# Patient Record
Sex: Female | Born: 1935 | Race: White | Hispanic: No | Marital: Married | Smoking: Former smoker
Health system: Southern US, Community
[De-identification: ages and names within clinical notes are randomized; demographics above are authoritative.]

## PROBLEM LIST (undated history)

## (undated) DIAGNOSIS — I6529 Occlusion and stenosis of unspecified carotid artery: Secondary | ICD-10-CM

## (undated) DIAGNOSIS — J309 Allergic rhinitis, unspecified: Secondary | ICD-10-CM

## (undated) DIAGNOSIS — I714 Abdominal aortic aneurysm, without rupture, unspecified: Secondary | ICD-10-CM

## (undated) DIAGNOSIS — F0390 Unspecified dementia without behavioral disturbance: Secondary | ICD-10-CM

## (undated) DIAGNOSIS — I739 Peripheral vascular disease, unspecified: Secondary | ICD-10-CM

## (undated) DIAGNOSIS — I2510 Atherosclerotic heart disease of native coronary artery without angina pectoris: Secondary | ICD-10-CM

## (undated) DIAGNOSIS — I5032 Chronic diastolic (congestive) heart failure: Secondary | ICD-10-CM

## (undated) DIAGNOSIS — I10 Essential (primary) hypertension: Secondary | ICD-10-CM

## (undated) DIAGNOSIS — E7800 Pure hypercholesterolemia, unspecified: Secondary | ICD-10-CM

## (undated) HISTORY — DX: Abdominal aortic aneurysm, without rupture: I71.4

## (undated) HISTORY — DX: Atherosclerotic heart disease of native coronary artery without angina pectoris: I25.10

## (undated) HISTORY — DX: Peripheral vascular disease, unspecified: I73.9

## (undated) HISTORY — DX: Essential (primary) hypertension: I10

## (undated) HISTORY — DX: Pure hypercholesterolemia, unspecified: E78.00

## (undated) HISTORY — DX: Occlusion and stenosis of unspecified carotid artery: I65.29

## (undated) HISTORY — DX: Abdominal aortic aneurysm, without rupture, unspecified: I71.40

## (undated) HISTORY — DX: Allergic rhinitis, unspecified: J30.9

---

## 1994-01-15 HISTORY — PX: COLONOSCOPY: SHX174

## 2005-12-04 ENCOUNTER — Ambulatory Visit: Payer: Self-pay | Admitting: Family Medicine

## 2005-12-04 DIAGNOSIS — J309 Allergic rhinitis, unspecified: Secondary | ICD-10-CM | POA: Insufficient documentation

## 2005-12-10 ENCOUNTER — Encounter: Payer: Self-pay | Admitting: Family Medicine

## 2005-12-12 ENCOUNTER — Ambulatory Visit: Payer: Self-pay | Admitting: Cardiovascular Disease

## 2005-12-12 ENCOUNTER — Encounter: Payer: Self-pay | Admitting: Cardiovascular Disease

## 2005-12-12 ENCOUNTER — Ambulatory Visit: Admission: RE | Admit: 2005-12-12 | Discharge: 2005-12-12 | Payer: Self-pay | Admitting: Family Medicine

## 2005-12-17 LAB — CONVERTED CEMR LAB
Albumin: 4.1 g/dL (ref 3.5–5.2)
CO2: 25 meq/L (ref 19–32)
Chloride: 101 meq/L (ref 96–112)
Cholesterol: 258 mg/dL — ABNORMAL HIGH (ref 0–200)
Glucose, Bld: 104 mg/dL — ABNORMAL HIGH (ref 70–99)
LDL Cholesterol: 182 mg/dL — ABNORMAL HIGH (ref 0–99)
Potassium: 3.8 meq/L (ref 3.5–5.3)
Sodium: 139 meq/L (ref 135–145)
Total Protein: 6.9 g/dL (ref 6.0–8.3)
Triglycerides: 107 mg/dL (ref <150)

## 2006-01-01 ENCOUNTER — Ambulatory Visit: Payer: Self-pay | Admitting: Family Medicine

## 2006-01-23 ENCOUNTER — Ambulatory Visit: Payer: Self-pay | Admitting: Cardiology

## 2006-01-23 ENCOUNTER — Encounter: Payer: Self-pay | Admitting: Family Medicine

## 2006-01-25 ENCOUNTER — Ambulatory Visit: Payer: Self-pay

## 2006-01-29 ENCOUNTER — Ambulatory Visit: Payer: Self-pay | Admitting: Cardiology

## 2006-01-29 ENCOUNTER — Ambulatory Visit (HOSPITAL_COMMUNITY): Admission: RE | Admit: 2006-01-29 | Discharge: 2006-01-29 | Payer: Self-pay | Admitting: Cardiology

## 2006-01-29 ENCOUNTER — Encounter: Payer: Self-pay | Admitting: Cardiology

## 2006-02-01 ENCOUNTER — Encounter: Payer: Self-pay | Admitting: Family Medicine

## 2006-02-04 ENCOUNTER — Ambulatory Visit: Payer: Self-pay | Admitting: Family Medicine

## 2006-02-20 ENCOUNTER — Encounter: Payer: Self-pay | Admitting: Family Medicine

## 2006-02-20 ENCOUNTER — Ambulatory Visit: Payer: Self-pay | Admitting: Cardiology

## 2006-03-01 ENCOUNTER — Encounter: Payer: Self-pay | Admitting: Family Medicine

## 2006-03-01 ENCOUNTER — Telehealth: Payer: Self-pay | Admitting: Family Medicine

## 2006-03-04 ENCOUNTER — Telehealth (INDEPENDENT_AMBULATORY_CARE_PROVIDER_SITE_OTHER): Payer: Self-pay | Admitting: *Deleted

## 2006-03-04 LAB — CONVERTED CEMR LAB
Albumin: 4.2 g/dL (ref 3.5–5.2)
Alkaline Phosphatase: 50 units/L (ref 39–117)
HDL: 59 mg/dL (ref >39)
LDL Cholesterol: 101 mg/dL — ABNORMAL HIGH (ref 0–99)
Total Protein: 6.9 g/dL (ref 6.0–8.3)
Triglycerides: 91 mg/dL (ref <150)
VLDL: 18 mg/dL (ref 0–40)

## 2006-03-06 ENCOUNTER — Ambulatory Visit: Payer: Self-pay | Admitting: Family Medicine

## 2006-05-01 ENCOUNTER — Ambulatory Visit: Payer: Self-pay | Admitting: Cardiology

## 2006-05-07 ENCOUNTER — Encounter: Payer: Self-pay | Admitting: Family Medicine

## 2006-05-07 ENCOUNTER — Ambulatory Visit: Payer: Self-pay | Admitting: Thoracic Surgery (Cardiothoracic Vascular Surgery)

## 2006-05-17 ENCOUNTER — Inpatient Hospital Stay (HOSPITAL_BASED_OUTPATIENT_CLINIC_OR_DEPARTMENT_OTHER): Admission: RE | Admit: 2006-05-17 | Discharge: 2006-05-17 | Payer: Self-pay | Admitting: Cardiovascular Disease

## 2006-05-17 ENCOUNTER — Ambulatory Visit: Payer: Self-pay | Admitting: Cardiovascular Disease

## 2006-05-20 ENCOUNTER — Encounter: Payer: Self-pay | Admitting: Family Medicine

## 2006-05-20 ENCOUNTER — Ambulatory Visit: Payer: Self-pay | Admitting: Thoracic Surgery (Cardiothoracic Vascular Surgery)

## 2006-05-22 ENCOUNTER — Inpatient Hospital Stay (HOSPITAL_COMMUNITY)
Admission: RE | Admit: 2006-05-22 | Discharge: 2006-05-30 | Payer: Self-pay | Admitting: Thoracic Surgery (Cardiothoracic Vascular Surgery)

## 2006-05-22 ENCOUNTER — Ambulatory Visit: Payer: Self-pay | Admitting: Cardiology

## 2006-05-22 ENCOUNTER — Ambulatory Visit: Payer: Self-pay | Admitting: Thoracic Surgery (Cardiothoracic Vascular Surgery)

## 2006-05-22 ENCOUNTER — Encounter (INDEPENDENT_AMBULATORY_CARE_PROVIDER_SITE_OTHER): Payer: Self-pay | Admitting: Specialist

## 2006-05-22 ENCOUNTER — Encounter: Payer: Self-pay | Admitting: Family Medicine

## 2006-05-22 HISTORY — PX: CORONARY ARTERY BYPASS GRAFT: SHX141

## 2006-05-22 HISTORY — PX: MITRAL VALVE REPAIR: SHX2039

## 2006-05-27 ENCOUNTER — Encounter: Payer: Self-pay | Admitting: Family Medicine

## 2006-06-05 ENCOUNTER — Ambulatory Visit: Payer: Self-pay | Admitting: Cardiology

## 2006-06-12 ENCOUNTER — Ambulatory Visit: Payer: Self-pay | Admitting: Cardiology

## 2006-06-20 ENCOUNTER — Encounter: Payer: Self-pay | Admitting: Family Medicine

## 2006-06-24 ENCOUNTER — Encounter: Payer: Self-pay | Admitting: Cardiology

## 2006-06-24 ENCOUNTER — Ambulatory Visit: Payer: Self-pay

## 2006-06-25 ENCOUNTER — Telehealth: Payer: Self-pay | Admitting: Family Medicine

## 2006-06-25 ENCOUNTER — Encounter: Payer: Self-pay | Admitting: Family Medicine

## 2006-06-25 LAB — CONVERTED CEMR LAB: INR: 2.9

## 2006-07-01 ENCOUNTER — Ambulatory Visit: Payer: Self-pay | Admitting: Cardiology

## 2006-07-03 ENCOUNTER — Ambulatory Visit: Payer: Self-pay | Admitting: Family Medicine

## 2006-07-08 ENCOUNTER — Encounter: Payer: Self-pay | Admitting: Family Medicine

## 2006-07-08 ENCOUNTER — Ambulatory Visit: Payer: Self-pay | Admitting: Thoracic Surgery (Cardiothoracic Vascular Surgery)

## 2006-07-17 ENCOUNTER — Ambulatory Visit: Payer: Self-pay | Admitting: Family Medicine

## 2006-07-17 LAB — CONVERTED CEMR LAB
INR: 2.5
Prothrombin Time: 19.1 s

## 2006-07-24 ENCOUNTER — Ambulatory Visit: Payer: Self-pay

## 2006-07-24 ENCOUNTER — Encounter: Payer: Self-pay | Admitting: Cardiology

## 2006-07-26 ENCOUNTER — Telehealth: Payer: Self-pay | Admitting: Family Medicine

## 2006-08-07 ENCOUNTER — Ambulatory Visit: Payer: Self-pay | Admitting: Family Medicine

## 2006-08-07 LAB — CONVERTED CEMR LAB
INR: 2.3
Prothrombin Time: 18.4 s

## 2006-08-09 ENCOUNTER — Encounter: Payer: Self-pay | Admitting: Family Medicine

## 2006-08-09 ENCOUNTER — Ambulatory Visit: Payer: Self-pay | Admitting: Vascular Surgery

## 2006-09-04 ENCOUNTER — Ambulatory Visit: Payer: Self-pay | Admitting: Family Medicine

## 2006-09-04 LAB — CONVERTED CEMR LAB: Prothrombin Time: 26.7 s

## 2006-09-18 ENCOUNTER — Ambulatory Visit: Payer: Self-pay | Admitting: Cardiology

## 2006-09-18 ENCOUNTER — Encounter: Payer: Self-pay | Admitting: Family Medicine

## 2006-10-02 ENCOUNTER — Ambulatory Visit: Payer: Self-pay | Admitting: Family Medicine

## 2006-10-02 LAB — CONVERTED CEMR LAB: Prothrombin Time: 14.4 s

## 2006-10-16 ENCOUNTER — Ambulatory Visit: Payer: Self-pay | Admitting: Family Medicine

## 2006-10-29 ENCOUNTER — Ambulatory Visit: Payer: Self-pay | Admitting: Cardiology

## 2006-10-29 ENCOUNTER — Ambulatory Visit: Payer: Self-pay

## 2006-10-29 ENCOUNTER — Encounter: Payer: Self-pay | Admitting: Cardiology

## 2006-10-29 LAB — CONVERTED CEMR LAB
Albumin: 4.1 g/dL (ref 3.5–5.2)
Bilirubin, Direct: 0.2 mg/dL (ref 0.0–0.3)
GFR calc Af Amer: 70 mL/min
GFR calc non Af Amer: 58 mL/min
HDL: 67.7 mg/dL (ref >39.0)
LDL Cholesterol: 88 mg/dL (ref 0–99)
Potassium: 4 meq/L (ref 3.5–5.1)
Sodium: 141 meq/L (ref 135–145)
Total CHOL/HDL Ratio: 2.5
Triglycerides: 71 mg/dL (ref 0–149)
VLDL: 14 mg/dL (ref 0–40)

## 2006-11-01 ENCOUNTER — Ambulatory Visit: Payer: Self-pay | Admitting: Family Medicine

## 2006-11-01 LAB — CONVERTED CEMR LAB
INR: 1.3
Prothrombin Time: 14.3 s

## 2006-11-04 ENCOUNTER — Telehealth: Payer: Self-pay | Admitting: Family Medicine

## 2006-11-08 ENCOUNTER — Ambulatory Visit: Payer: Self-pay | Admitting: Family Medicine

## 2006-11-08 LAB — CONVERTED CEMR LAB: Prothrombin Time: 15.3 s

## 2007-01-20 ENCOUNTER — Ambulatory Visit: Payer: Self-pay | Admitting: Thoracic Surgery (Cardiothoracic Vascular Surgery)

## 2007-01-22 ENCOUNTER — Ambulatory Visit: Payer: Self-pay | Admitting: Cardiology

## 2007-01-29 ENCOUNTER — Ambulatory Visit: Payer: Self-pay | Admitting: Vascular Surgery

## 2007-07-30 ENCOUNTER — Encounter: Payer: Self-pay | Admitting: Family Medicine

## 2007-07-30 ENCOUNTER — Ambulatory Visit: Payer: Self-pay | Admitting: Vascular Surgery

## 2007-09-03 ENCOUNTER — Ambulatory Visit: Payer: Self-pay | Admitting: Vascular Surgery

## 2007-09-04 ENCOUNTER — Encounter: Payer: Self-pay | Admitting: Vascular Surgery

## 2007-09-04 ENCOUNTER — Inpatient Hospital Stay (HOSPITAL_COMMUNITY): Admission: RE | Admit: 2007-09-04 | Discharge: 2007-09-05 | Payer: Self-pay | Admitting: Vascular Surgery

## 2007-09-04 ENCOUNTER — Ambulatory Visit: Payer: Self-pay | Admitting: Vascular Surgery

## 2007-09-17 ENCOUNTER — Ambulatory Visit: Payer: Self-pay | Admitting: Vascular Surgery

## 2007-12-03 ENCOUNTER — Ambulatory Visit: Payer: Self-pay | Admitting: Family Medicine

## 2008-01-21 ENCOUNTER — Ambulatory Visit: Payer: Self-pay | Admitting: Cardiology

## 2008-03-15 HISTORY — PX: CAROTID ENDARTERECTOMY: SUR193

## 2008-03-17 ENCOUNTER — Ambulatory Visit: Payer: Self-pay | Admitting: Vascular Surgery

## 2008-04-05 ENCOUNTER — Inpatient Hospital Stay (HOSPITAL_COMMUNITY): Admission: RE | Admit: 2008-04-05 | Discharge: 2008-04-06 | Payer: Self-pay | Admitting: Vascular Surgery

## 2008-04-05 ENCOUNTER — Ambulatory Visit: Payer: Self-pay | Admitting: Internal Medicine

## 2008-04-05 ENCOUNTER — Encounter: Payer: Self-pay | Admitting: Vascular Surgery

## 2008-04-05 ENCOUNTER — Ambulatory Visit: Payer: Self-pay | Admitting: Vascular Surgery

## 2008-04-28 ENCOUNTER — Ambulatory Visit: Payer: Self-pay | Admitting: Vascular Surgery

## 2008-10-06 ENCOUNTER — Ambulatory Visit: Payer: Self-pay | Admitting: Vascular Surgery

## 2008-10-07 ENCOUNTER — Telehealth: Payer: Self-pay | Admitting: Cardiology

## 2009-04-27 ENCOUNTER — Encounter: Payer: Self-pay | Admitting: Family Medicine

## 2009-04-27 ENCOUNTER — Ambulatory Visit: Payer: Self-pay | Admitting: Vascular Surgery

## 2009-07-26 DIAGNOSIS — E876 Hypokalemia: Secondary | ICD-10-CM

## 2009-07-27 ENCOUNTER — Ambulatory Visit: Payer: Self-pay | Admitting: Cardiology

## 2009-07-27 ENCOUNTER — Encounter: Payer: Self-pay | Admitting: Cardiology

## 2009-08-22 ENCOUNTER — Ambulatory Visit: Payer: Self-pay | Admitting: Cardiology

## 2009-08-22 ENCOUNTER — Ambulatory Visit: Payer: Self-pay | Admitting: Internal Medicine

## 2009-08-22 ENCOUNTER — Encounter: Payer: Self-pay | Admitting: Cardiology

## 2009-08-22 ENCOUNTER — Ambulatory Visit (HOSPITAL_COMMUNITY): Admission: RE | Admit: 2009-08-22 | Discharge: 2009-08-22 | Payer: Self-pay | Admitting: Cardiology

## 2009-08-22 ENCOUNTER — Ambulatory Visit: Payer: Self-pay

## 2009-08-22 ENCOUNTER — Encounter: Payer: Self-pay | Admitting: Cardiovascular Disease

## 2009-08-23 LAB — CONVERTED CEMR LAB
Albumin: 3.8 g/dL (ref 3.5–5.2)
CO2: 28 meq/L (ref 19–32)
GFR calc non Af Amer: 81.35 mL/min (ref >60)
Glucose, Bld: 90 mg/dL (ref 70–99)
HDL: 63.7 mg/dL (ref >39.00)
Potassium: 4.1 meq/L (ref 3.5–5.1)
Sodium: 144 meq/L (ref 135–145)
TSH: 2.75 microintl units/mL (ref 0.35–5.50)
Total Bilirubin: 1 mg/dL (ref 0.3–1.2)
Total CHOL/HDL Ratio: 2
Triglycerides: 54 mg/dL (ref 0.0–149.0)
VLDL: 10.8 mg/dL (ref 0.0–40.0)

## 2009-09-07 ENCOUNTER — Ambulatory Visit: Payer: Self-pay | Admitting: Cardiology

## 2009-09-07 ENCOUNTER — Encounter: Payer: Self-pay | Admitting: Cardiology

## 2009-09-07 DIAGNOSIS — I701 Atherosclerosis of renal artery: Secondary | ICD-10-CM

## 2009-09-15 ENCOUNTER — Encounter: Payer: Self-pay | Admitting: Cardiology

## 2009-12-29 ENCOUNTER — Ambulatory Visit: Payer: Self-pay | Admitting: Vascular Surgery

## 2010-02-10 NOTE — Procedures (Unsigned)
CAROTID DUPLEX EXAM  INDICATION:  Follow up carotid artery disease.  HISTORY: Diabetes:  No. Cardiac:  CABG. Hypertension:  Yes. Smoking:  No. Previous Surgery:  Bilateral carotid endarterectomies. CV History:  Patient is currently asymptomatic. Amaurosis Fugax No, Paresthesias No, Hemiparesis No.                                      RIGHT             LEFT Brachial systolic pressure:         168               164 Brachial Doppler waveforms:         WNL               WNL Vertebral direction of flow:        Antegrade         Antegrade DUPLEX VELOCITIES (cm/sec) CCA peak systolic                   25                109 ECA peak systolic                   46                114 ICA peak systolic                   82                80 ICA end diastolic                   31                19 PLAQUE MORPHOLOGY:                  Heterogenous      Heterogenous PLAQUE AMOUNT:                      Moderate          Mild PLAQUE LOCATION:                    CCA, ICA, ECA     ECA, CCA  IMPRESSION: 1. Bilateral patent carotid endarterectomies. 2. No hemodynamically significant stenosis. 3. Bilateral intimal thickening within the common carotid arteries. 4. The right external carotid artery appears occluded within the     proximal origin with some trickle flow past the proximal segment.  ___________________________________________ Janetta Hora. Fields, MD  OD/MEDQ  D:  01/30/2010  T:  01/30/2010  Job:  [id number]

## 2010-02-14 NOTE — Assessment & Plan Note (Signed)
Summary: [facility]  Medications Added AMLODIPINE BESYLATE 5 MG TABS (AMLODIPINE BESYLATE) Take one tablet by mouth daily      Allergies Added: NKDA  Visit Type:  Follow-up Primary Provider:  Linford Arnold, C  CC:  High blood pressure.  History of Present Illness: [name] is a pleasant female who has a history of mitral valve repair in May 2008 secondary to severe mitral regurgitation/mitral valve prolapse.  She also had coronary bypassing graft at that time with a LIMA to the LAD and a saphenous vein graft to the ramus intermedius. She also had closure of patent foramen ovale and ligation of the left atrial appendage.  Her most recent echocardiogram was performed on October 29, 2006.  She had normal LV function.  There was mild aortic insufficiency.  There is only trivial mitral regurgitation.  The left atrium was mildly dilated as was the right atrium.  The right ventricle was mildly dilated.  There was a probable eustachian valve or Chiari network in the right atrium. She had carotid endarterectomy in March 2010. I last saw her in January of 2010. Since then, she has dyspnea with more extreme activities but not with routine activities. It is relieved with rest. There is no orthopnea, PND, pedal edema, palpitations or syncope. She occasionally feels chest tightness in the upper chest area when she feels "stressed". This also occurrs with more extreme activities and is relieved with rest. It has been present since her bypass surgery and is unchanged in severity. She is not having symptoms at rest.   Preventive Screening-Counseling & Management  Alcohol-Tobacco     Smoking Status: quit  Current Medications (verified): 1)  Aspirin 81 Mg Tbec (Aspirin) .... Take 1 Tablet By Mouth Once A Day 2)  Enalapril Maleate 20 Mg Tabs (Enalapril Maleate) .Marland Kitchen.. 1 Tab By Mouth Once Daily 3)  Toprol Xl 100 Mg Tb24 (Metoprolol Succinate) .... Take 1 Tablet By Mouth Once A Day 4)  Furosemide 40 Mg  Tabs (Furosemide) .... Take One Tablet By Mouth Daily. 5)  Potassium Chloride Crys Cr 20 Meq Cr-Tabs (Potassium Chloride Crys Cr) .Marland Kitchen.. 1 Tab By Mouth Once Daily 6)  Lipitor 80 Mg Tabs (Atorvastatin Calcium) .... Take One Tablet By Mouth Daily.  Allergies (verified): No Known Drug Allergies  Past History:  Past Medical History: CAROTID ARTERY STENOSIS  HYPERTENSION, BENIGN ESSENTIAL PVD  ATHEROSCLEROSIS, CORONARY, NATIVE ARTERY  HYPERCHOLESTEROLEMIA  RHINITIS, ALLERGIC NOS  Past Surgical History: Reviewed history from 07/26/2009 and no changes required. Mitral Valve repair (quadrangular resection of post leaflet w/ sliding laeflet annuloplaty and 24mm Edwards Physio ring)  05-22-06 CABG x 2 (Left int mammary artery to distal left ant descending CA, saphenous vein graft to ramus intermediate branch)  05-22-06 Right carotid endarterectomy  03/2008 Colonoscopy in [place] in [place].    Social History: Reviewed history from 12/04/2005 and no changes required. 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   Tobacco Use - Former.  Smoking Status:  quit  Review of Systems       no fevers or chills, productive cough, hemoptysis, dysphasia, odynophagia, melena, hematochezia, dysuria, hematuria, rash, seizure activity, orthopnea, PND, pedal edema, claudication. Remaining systems are negative.   Vital Signs:  Patient profile:   77 year old female Height:      61 inches Weight:      150 pounds BMI:     28.44 Pulse rate:   73 / minute Pulse rhythm:   regular Resp:  18 per minute BP sitting:   211 / 93  (left arm) Cuff size:   large  Vitals Entered By: Vikki Ports (July 27, 2009 4:09 PM)  Physical Exam  General:  Well-developed well-nourished in no acute distress.  Skin is warm and dry.  HEENT is normal. No papilledema Neck is supple. No thyromegaly. bilateral bruits. Status post right carotid endarterectomy. Chest is clear to auscultation with normal expansion.    Cardiovascular exam is regular rate and rhythm. 2/6 systolic ejection murmur. Abdominal exam nontender or distended. No masses palpated. Extremities show no edema. neuro grossly intact    EKG  Procedure date:  07/27/2009  Findings:      Normal sinus rhythm at a rate of 68. Axis normal. Nonspecific ST changes.  Impression & Recommendations:  Problem # 1:  CAROTID ARTERY STENOSIS (ICD-433.10) Continue aspirin and statin. The following medications were removed from the medication list:    Coumadin 4 Mg Tabs (Warfarin sodium) ..... Sunday - 4 mg, monday - 4 mg, tuesday - 4 mg, wednesday - 5 mg, thursday - 4 mg, friday - 4 mg, saturday - 4 mg    Coumadin 1 Mg Tabs (Warfarin sodium) ..... Sunday - 4 mg, monday - 4 mg, tuesday - 4 mg, wednesday - 5 mg, thursday - 4 mg, friday - 4 mg, saturday - 4 mg Her updated medication list for this problem includes:    Aspirin 81 Mg Tbec (Aspirin) .Marland Kitchen... Take 1 tablet by mouth once a day  Problem # 2:  HYPERTENSION, BENIGN ESSENTIAL (ICD-401.1)  Blood pressure elevated. Add Norvasc 5 mg p.o. daily. Increase as needed. Given recent increase in blood pressure and history of vascular disease check renal Dopplers. Note patient was given clonidine 0.2 mg p.o. total while in the office and her systolic blood pressure improved to 195 and her diastolic 90. She did not have papilledema on examination, headaches or other signs of elevated blood pressure. Her updated medication list for this problem includes:    Aspirin 81 Mg Tbec (Aspirin) .Marland Kitchen... Take 1 tablet by mouth once a day    Enalapril Maleate 20 Mg Tabs (Enalapril maleate) .Marland Kitchen... 1 tab by mouth once daily    Toprol Xl 100 Mg Tb24 (Metoprolol succinate) .Marland Kitchen... Take 1 tablet by mouth once a day    Furosemide 40 Mg Tabs (Furosemide) .Marland Kitchen... Take one tablet by mouth daily.    Amlodipine Besylate 5 Mg Tabs (Amlodipine besylate) .Marland Kitchen... Take one tablet by mouth daily  Orders: Clonidine 0.1mg  tab [facility]) Renal  Artery Duplex (Renal Artery Duplex) Clonidine 0.1mg  tab (EMRORAL)  Problem # 3:  ATHEROSCLEROSIS, CORONARY, NATIVE ARTERY (ICD-414.01) Continue aspirin, ACE inhibitor, beta blocker and statin. The following medications were removed from the medication list:    Coumadin 4 Mg Tabs (Warfarin sodium) ..... Sunday - 4 mg, monday - 4 mg, tuesday - 4 mg, wednesday - 5 mg, thursday - 4 mg, friday - 4 mg, saturday - 4 mg    Coumadin 1 Mg Tabs (Warfarin sodium) ..... Sunday - 4 mg, monday - 4 mg, tuesday - 4 mg, wednesday - 5 mg, thursday - 4 mg, friday - 4 mg, saturday - 4 mg Her updated medication list for this problem includes:    Aspirin 81 Mg Tbec (Aspirin) .Marland Kitchen... Take 1 tablet by mouth once a day    Enalapril Maleate 20 Mg Tabs (Enalapril maleate) .Marland Kitchen... 1 tab by mouth once daily    Toprol Xl 100 Mg Tb24 (Metoprolol succinate) .Marland KitchenMarland KitchenMarland KitchenMarland Kitchen  Take 1 tablet by mouth once a day    Amlodipine Besylate 5 Mg Tabs (Amlodipine besylate) .Marland Kitchen... Take one tablet by mouth daily  Problem # 4:  HYPERCHOLESTEROLEMIA (ICD-272.0) Continue statin. Check lipids and liver. The following medications were removed from the medication list:    Zocor 80 Mg Tabs (Simvastatin) .Marland Kitchen... Take 1 tablet by mouth once a day at bedtime Her updated medication list for this problem includes:    Lipitor 80 Mg Tabs (Atorvastatin calcium) .Marland Kitchen... Take one tablet by mouth daily.  Problem # 5:  MITRAL VALVE REPLACEMENT, HX OF (ICD-V15.1)  History of mitral valve repair. Continue SBE prophylaxis. Repeat echocardiogram.  Orders: Echocardiogram (Echo)  Problem # 6:  CHEST PAIN (ICD-786.50) The symptoms have been present since bypass surgery and are unchanged. Add Norvasc for possible angina. Consider further workup if symptoms persist. The following medications were removed from the medication list:    Coumadin 4 Mg Tabs (Warfarin sodium) ..... "Sunday - 4 mg, monday - 4 mg, tuesday - 4 mg, wednesday - 5 mg, thursday - 4 mg, friday - 4 mg, saturday  - 4 mg    Coumadin 1 Mg Tabs (Warfarin sodium) ..... Sunday - 4 mg, monday - 4 mg, tuesday - 4 mg, wednesday - 5 mg, thursday - 4 mg, friday - 4 mg, saturday - 4 mg Her updated medication list for this problem includes:    Aspirin 81 Mg Tbec (Aspirin) ..... Take 1 tablet by mouth once a day    Enalapril Maleate 20 Mg Tabs (Enalapril maleate) ..... 1 tab by mouth once daily    Toprol Xl 100 Mg Tb24 (Metoprolol succinate) ..... Take 1 tablet by mouth once a day    Amlodipine Besylate 5 Mg Tabs (Amlodipine besylate) ..... Take one tablet by mouth daily  Problem # 7:  CEREBROVASCULAR DISEASE (ICD-437.9) Continue aspirin and statin. Carotid Dopplers followed by vascular surgery.  Patient Instructions: 1)  Your physician recommends that you schedule a follow-up appointment in: 3 MONTHS 2)  Your physician recommends that you return for lab work in:WITH DOPPLERS 3)  Your physician has recommended you make the following change in your medication: START AMLODIPINE 5MG ONCE DAILY 4)  Your physician has requested that you have an echocardiogram.  Echocardiography is a painless test that uses sound waves to create images of your heart. It provides your doctor with information about the size and shape of your heart and how well your heart's chambers and valves are working.  This procedure takes approximately one hour. There are no restrictions for this procedure. 5)  Your physician has requested that you have a renal artery duplex. During this test, an ultrasound is used to evaluate blood flow to the kidneys. Allow one hour for this exam. Do not eat after midnight the day before and avoid carbonated beverages. Take your medications as you usually do. Prescriptions: AMLODIPINE BESYLATE 5 MG TABS (AMLODIPINE BESYLATE) Take one tablet by mouth daily  #30 x 12   Entered by:   Debra Mathis, RN   Authorized by:   Brian Saunders Crenshaw, MD, FACC   Signed by:   Debra Mathis, RN on 07/27/2009   Method used:    [facility]        [facility]  [address] [id number]       [address] [place], [place]  [address]       Ph: [phone] or [phone]  Fax: [phone]   RxID:   [id number]    Medication Administration  Medication # 1:    Medication: Clonidine 0.1mg  tab    Diagnosis: HYPERTENSION, BENIGN ESSENTIAL (ICD-401.1)    Dose: 1 tablet    Route: po    Exp Date: 11/12/2009    Lot #: [id number]    Given by: Deliah Goody, RN (July 27, 2009 4:32 PM)  Medication # 2:    Medication: Clonidine 0.1mg  tab    Diagnosis: HYPERTENSION, BENIGN ESSENTIAL (ICD-401.1)    Dose: 1 tablet    Route: po    Exp Date: 11/12/2009    Lot #: [id number]    Comments: REPEAT BP 204/100    Given by: Deliah Goody, RN (July 27, 2009 5:07 PM)  Orders Added: 1)  Clonidine 0.1mg  tab [EMRORAL] 2)  Echocardiogram [Echo] 3)  Renal Artery Duplex [Renal Artery Duplex] 4)  Clonidine 0.1mg  tab [EMRORAL]

## 2010-02-14 NOTE — Assessment & Plan Note (Signed)
Summary: [facility]   Visit Type:  1 month follow up Primary Provider:  Linford Arnold, C  CC:  Vertigo episode a week ago. Marland Kitchen  History of Present Illness: Mrs. Strege is a pleasant female who has a history of mitral valve repair in May 2008 secondary to severe mitral regurgitation/mitral valve prolapse.  She also had coronary bypassing graft at that time with a LIMA to the LAD and a saphenous vein graft to the ramus intermedius. She also had closure of patent foramen ovale and ligation of the left atrial appendage.  Her most recent echocardiogram was performed in August of 2011.  She had normal LV function.  There was trace aortic insufficiency.  There was mild MS with mean gradient of 6 mmHg and MVA 1.82 cm2.  The right atrium was mildly dilated. She had carotid endarterectomy in March 2010. Abdominal ultrasound in August of 2011 revealed a 2.6 by 3 cm AAA and a 1-59% left RAS. I last saw her in July of 2011. Her blood pressure was elevated at that time. We added additional medications. Since then she did have an episode of vertigo. However there is no dyspnea, chest pain, palpitations or syncope. Her systolic blood pressure is averaging 150 by  her report.  Current Medications (verified): 1)  Aspirin 81 Mg Tbec (Aspirin) .... Take 1 Tablet By Mouth Once A Day 2)  Enalapril Maleate 20 Mg Tabs (Enalapril Maleate) .Marland Kitchen.. 1 Tab By Mouth Once Daily 3)  Toprol Xl 100 Mg Tb24 (Metoprolol Succinate) .... Take 1 Tablet By Mouth Once A Day 4)  Furosemide 40 Mg Tabs (Furosemide) .... Take One Tablet By Mouth Daily. 5)  Potassium Chloride Crys Cr 20 Meq Cr-Tabs (Potassium Chloride Crys Cr) .Marland Kitchen.. 1 Tab By Mouth Once Daily 6)  Lipitor 80 Mg Tabs (Atorvastatin Calcium) .... Take One Tablet By Mouth Daily. 7)  Amlodipine Besylate 5 Mg Tabs (Amlodipine Besylate) .... Take One Tablet By Mouth Daily  Allergies (verified): No Known Drug Allergies  Past History:  Past Medical History: Reviewed history from  07/27/2009 and no changes required. CAROTID ARTERY STENOSIS  HYPERTENSION, BENIGN ESSENTIAL PVD  ATHEROSCLEROSIS, CORONARY, NATIVE ARTERY  HYPERCHOLESTEROLEMIA  RHINITIS, ALLERGIC NOS  Past Surgical History: Reviewed history from 07/26/2009 and no changes required. Mitral Valve repair (quadrangular resection of post leaflet w/ sliding laeflet annuloplaty and 24mm Edwards Physio ring)  05-22-06 CABG x 2 (Left int mammary artery to distal left ant descending CA, saphenous vein graft to ramus intermediate branch)  05-22-06 Right carotid endarterectomy  03/2008 Colonoscopy in [place] in [place].    Social History: Reviewed history from 07/27/2009 and no changes required. 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   Tobacco Use - Former.   Review of Systems       no fevers or chills, productive cough, hemoptysis, dysphasia, odynophagia, melena, hematochezia, dysuria, hematuria, rash, seizure activity, orthopnea, PND, pedal edema, claudication. Remaining systems are negative.   Vital Signs:  Patient profile:   77 year old female Height:      61 inches Weight:      150.75 pounds BMI:     28.59 Pulse rate:   67 / minute Pulse rhythm:   regular Resp:     18 per minute BP sitting:   162 / 84  (right arm) Cuff size:   large  Vitals Entered By: Vikki Ports (September 07, 2009 1:58 PM)  Physical Exam  General:  Well-developed well-nourished in no acute distress.  Skin is  warm and dry.  HEENT is normal.  Neck is supple. No thyromegaly. Loud right carotid bruit Chest is clear to auscultation with normal expansion.  Cardiovascular exam is regular rate and rhythm. 2/6 systolic murmur Abdominal exam nontender or distended. No masses palpated. Extremities show no edema. neuro grossly intact    Impression & Recommendations:  Problem # 1:  CEREBROVASCULAR DISEASE (ICD-437.9) Continue aspirin and statin. Followed by vascular surgery.  Problem # 2:  MITRAL VALVE REPLACEMENT, HX  OF (ICD-V15.1) Status post mitral valve repair. Continue SBE prophylaxis.  Problem # 3:  HYPERTENSION, BENIGN ESSENTIAL (ICD-401.1) Blood pressure improved but still mildly elevated. Increase enalapril to 20 mg p.o. b.i.d. Check potassium and renal function in one week. Her updated medication list for this problem includes:    Aspirin 81 Mg Tbec (Aspirin) .Marland Kitchen... Take 1 tablet by mouth once a day    Enalapril Maleate 20 Mg Tabs (Enalapril maleate) .Marland Kitchen... 1 tab by mouth two times a day    Toprol Xl 100 Mg Tb24 (Metoprolol succinate) .Marland Kitchen... Take 1 tablet by mouth once a day    Furosemide 40 Mg Tabs (Furosemide) .Marland Kitchen... Take one tablet by mouth daily.    Amlodipine Besylate 5 Mg Tabs (Amlodipine besylate) .Marland Kitchen... Take one tablet by mouth daily  Problem # 4:  ATHEROSCLEROSIS, CORONARY, NATIVE ARTERY (ICD-414.01) Continue aspirin, ACE inhibitor, beta blocker and statin. Her updated medication list for this problem includes:    Aspirin 81 Mg Tbec (Aspirin) .Marland Kitchen... Take 1 tablet by mouth once a day    Enalapril Maleate 20 Mg Tabs (Enalapril maleate) .Marland Kitchen... 1 tab by mouth two times a day    Toprol Xl 100 Mg Tb24 (Metoprolol succinate) .Marland Kitchen... Take 1 tablet by mouth once a day    Amlodipine Besylate 5 Mg Tabs (Amlodipine besylate) .Marland Kitchen... Take one tablet by mouth daily  Problem # 5:  HYPERCHOLESTEROLEMIA (ICD-272.0) Continue statin. Her updated medication list for this problem includes:    Lipitor 80 Mg Tabs (Atorvastatin calcium) .Marland Kitchen... Take one tablet by mouth daily.  Problem # 6:  ABDOMINAL AORTIC ANEURYSM (ICD-441.4) Followup abdominal ultrasound August 2012.  Problem # 7:  RENAL ARTERY STENOSIS (ICD-440.1) Continue aspirin and statin. Followup renal Dopplers August 2012 for 1-59% left renal artery stenosis.  Other Orders: T-Basic Metabolic Panel [phone])  Patient Instructions: 1)  Your physician recommends that you schedule a follow-up appointment in: 6 MONTHS 2)  Your physician recommends that  you return for lab work in:ONE WEEK 3)  Your physician has recommended you make the following change in your medication: INCREASE ENALAPRIL 20MG  TWICE DAILY Prescriptions: ENALAPRIL MALEATE 20 MG TABS (ENALAPRIL MALEATE) 1 tab by mouth two times a day  #60 x 12   Entered by:   Deliah Goody, RN   Authorized by:   Ferman Hamming, MD, [facility]   Signed by:   Deliah Goody, RN on 09/07/2009   Method used:   [facility] to        [facility] [phone][facility] (retail)       [address] [place], [place]  [address]       Ph: [phone] or [phone]       Fax: [phone]   RxID:   [id number]

## 2010-04-27 LAB — TYPE AND SCREEN

## 2010-04-27 LAB — URINE MICROSCOPIC-ADD ON

## 2010-04-27 LAB — BASIC METABOLIC PANEL
BUN: 15 mg/dL (ref 6–23)
Creatinine, Ser: 0.86 mg/dL (ref 0.4–1.2)
GFR calc Af Amer: >60 mL/min (ref >60)
GFR calc non Af Amer: >60 mL/min (ref >60)
Potassium: 3.6 mEq/L (ref 3.5–5.1)

## 2010-04-27 LAB — COMPREHENSIVE METABOLIC PANEL
Albumin: 3.9 g/dL (ref 3.5–5.2)
Alkaline Phosphatase: 73 U/L (ref 39–117)
BUN: 23 mg/dL (ref 6–23)
Calcium: 9.6 mg/dL (ref 8.4–10.5)
Glucose, Bld: 101 mg/dL — ABNORMAL HIGH (ref 70–99)
Potassium: 4.7 mEq/L (ref 3.5–5.1)
Total Protein: 6.9 g/dL (ref 6.0–8.3)

## 2010-04-27 LAB — CK TOTAL AND CKMB (NOT AT ARMC)
Relative Index: 1.1 (ref 0.0–2.5)
Relative Index: 1.6 (ref 0.0–2.5)
Total CK: 188 U/L — ABNORMAL HIGH (ref 7–177)
Total CK: 253 U/L — ABNORMAL HIGH (ref 7–177)

## 2010-04-27 LAB — CBC
HCT: 38.8 % (ref 36.0–46.0)
MCHC: 33.4 g/dL (ref 30.0–36.0)
Platelets: 114 10*3/uL — ABNORMAL LOW (ref 150–400)
Platelets: 170 10*3/uL (ref 150–400)
RDW: 16.7 % — ABNORMAL HIGH (ref 11.5–15.5)
WBC: 6.4 10*3/uL (ref 4.0–10.5)

## 2010-04-27 LAB — URINALYSIS, ROUTINE W REFLEX MICROSCOPIC
Glucose, UA: NEGATIVE mg/dL
Hgb urine dipstick: NEGATIVE
Specific Gravity, Urine: 1.027 (ref 1.005–1.030)
Urobilinogen, UA: 0.2 mg/dL (ref 0.0–1.0)
pH: 5 (ref 5.0–8.0)

## 2010-04-27 LAB — TROPONIN I
Troponin I: 0.04 ng/mL (ref 0.00–0.06)
Troponin I: 0.04 ng/mL (ref 0.00–0.06)
Troponin I: <0.01 ng/mL (ref 0.00–0.06)

## 2010-04-27 LAB — PROTIME-INR: Prothrombin Time: 12.7 seconds (ref 11.6–15.2)

## 2010-04-27 LAB — HEMOGLOBIN AND HEMATOCRIT, BLOOD: HCT: 22.3 % — ABNORMAL LOW (ref 36.0–46.0)

## 2010-05-30 NOTE — Procedures (Signed)
CAROTID DUPLEX EXAM   INDICATION:  Followup known carotid artery disease and bilateral CEA.   HISTORY:  Diabetes:  No.  Cardiac:  CABG.  Hypertension:  Yes.  Smoking:  No.  Previous Surgery:  Bilateral CEA.  CV History:  No.  Amaurosis Fugax No, Paresthesias No, Hemiparesis No                                       RIGHT             LEFT  Brachial systolic pressure:         220               220  Brachial Doppler waveforms:         Biphasic          Biphasic  Vertebral direction of flow:        Antegrade         Antegrade  DUPLEX VELOCITIES (cm/sec)  CCA peak systolic                   217               89  ECA peak systolic                   89                110  ICA peak systolic                   117               92  ICA end diastolic                   37                25  PLAQUE MORPHOLOGY:                  Calcified         Calcified  PLAQUE AMOUNT:                      Mild              Mild  PLAQUE LOCATION:                    ECA and CCA       ECA and CCA   IMPRESSION:  1. 1-39% stenosis noted in bilateral internal carotid arteries.  2. Antegrade bilateral vertebral arteries.         ___________________________________________  Janetta Hora Fields, MD   MG/MEDQ  D:  10/06/2008  T:  10/07/2008  Job:  [id number]

## 2010-05-30 NOTE — Assessment & Plan Note (Signed)
OFFICE VISIT   [name], [name]  DOB:  09/03/1932                                       04/28/2008  CHART#:[id number]   The patient is a 77 year old female who returns for followup today after  right carotid endarterectomy on March 22.  She recovered well from the  operation.  She had no neurologic problems perioperatively and has  continued to be symptom free since discharge from the hospital.   On exam today blood pressure is 170/75 in the left arm, 177/81 in the  right arm, pulse is 56 and regular.  Right neck incision is well-healed.  Upper extremity and lower extremity motor strength is 5/5 and symmetric.  She has no tongue deviation.   She continues to take her aspirin 81 mg once a day.  Overall she is  doing well.  She will follow up in September of 2010 for repeat carotid  duplex exam.   Janetta Hora. Fields, MD  Electronically Signed   CEF/MEDQ  D:  04/28/2008  T:  04/29/2008  Job:  [id number]   cc:   Nani Gasser, M.D.  [name] Jens Som, MD, [facility]

## 2010-05-30 NOTE — Assessment & Plan Note (Signed)
[facility]                            CARDIOLOGY OFFICE NOTE   NAME:Bardon, Yehudis B                         MRN:          [id number]  DATE:06/05/2006                            DOB:          04/24/1932    Mrs. [name] returns for followup today.  She has a history of severe  mitral regurgitation secondary to posterior mitral valve prolapse as  well as cerebrovascular disease.  She recently was admitted to [facility] electively for repair of her mitral valve.  Note, she did  have coronary disease based on her preop catheterization.  On May 22, 2006 she underwent mitral valve repair.  She also had a LIMA to the LAD  and a saphenous vein graft to the ramus intermedius.  She also had  ligation of left atrial appendage and closure of the a patent foramen  ovale.  Postoperatively, she did have some confusion but otherwise did  well.  She briefly had an episode of atrial fibrillation and was placed  on amiodarone.  Since discharge she has had some weakness; however, this  is slowly improving.  There is mild dyspnea on exertion but there is no  orthopnea, PND, palpitations, presyncope, or syncope.  There is no  exertional chest pain.  She does have pedal edema in her right lower  extremity which is the leg where the veins were harvested.   MEDICATIONS:  1. Baby aspirin 81 mg p.o. daily.  2. Coumadin as directed.  3. Enalapril 20 mg p.o. b.i.d.  4. Lipitor 20 mg p.o. daily.  5. Mucinex 600 mg p.o. b.i.d.  6. Amiodarone 200 mg p.o. daily.  7. Metoprolol ER 25 mg p.o. daily.  8. Lasix 40 mg p.o. daily.  9. Potassium 20 mEq p.o. daily.   PHYSICAL EXAMINATION:  Shows a blood pressure of 139/74 and her pulse  was 75.  She weighs 121 pounds.  Her HEENT is normal.  Her neck is supple.  Her chest is clear.  CARDIOVASCULAR EXAM:  Reveals a regular rate and rhythm.  There is no  systolic murmur noted.  Her sternotomy is without evidence of infection.  Her abdominal exam was benign.  Her right lower extremity shows trace to 1+ edema.  Her left lower  extremity shows no edema.   Her electrocardiogram shows a sinus rhythm at a rate of 76.  The axis is  normal.  There is inferolateral T-wave inversion.   DIAGNOSES:  1. Status post mitral valve repair:  We will check a baseline      echocardiogram following her procedure.  She will need to continue      with SBE prophylaxis as well.  2. Coronary artery disease:  We will continue with her aspirin,      statin, angiotensin-converting enzyme inhibitor, and beta blocker.  3. Cerebrovascular disease:  She is being followed by Dr. Darrick Penna for      this issue.  4. Coumadin:  We will arrange for her to be seen in our Coumadin      clinic with goal INR of 2-3.  This will be short term.  5. History of postoperative atrial fibrillation:  She remains in sinus      rhythm.  I will plan to continue with her amiodarone for 3 months      and then discontinue it if she maintains sinus rhythm.  6. Hypertension:  Her blood pressure is well controlled on her present      medication.  7. Hyperlipidemia:  We will continue with her Lipitor.  Once we have      discontinued her amiodarone, then I will increase her Lipitor to 80      mg p.o. daily, and we will then check lipids and liver 6 weeks      later.  8. History of tobacco use:  Now resolved.   She will need a chest x-ray prior to seeing Dr. Cornelius Moras.  I will see her  back in 3 months.     [name] Jens Som, MD, [facility]  Electronically Signed    BSC/MedQ  DD: 06/05/2006  DT: 06/05/2006  Job #: [id number]   cc:   Nani Gasser, M.D.

## 2010-05-30 NOTE — Discharge Summary (Signed)
[name], [name]                  ACCOUNT NO.:  [id number]   MEDICAL RECORD NO.:  [id number]          PATIENT TYPE:  INP   LOCATION:  3305                         FACILITY:  [facility]   PHYSICIAN:  Janetta Hora. Fields, MD  DATE OF BIRTH:  [birth year]   DATE OF ADMISSION:  04/05/2008  DATE OF DISCHARGE:  04/06/2008                               DISCHARGE SUMMARY   ADMISSION DIAGNOSIS:  High-grade right internal carotid artery stenosis.   FINAL DISCHARGE DIAGNOSES:  1. High-grade right internal carotid artery stenosis status post right      carotid endarterectomy.  2. Intraoperative acute blood loss anemia ultimately requiring      transfusion.  3. Postoperative hypotension felt likely related to anemia, improved.  4. Postoperative chest pain, felt most likely secondary to demand      ischemia in the setting of anemia with negative troponin.  5. Coronary artery disease with history of coronary artery bypass      grafting and mitral valve repair in 2008.  6. Dyslipidemia.  7. Hypokalemia, on supplementation.  8. History of left carotid endarterectomy.  9. History of hypertension.  10.History of tobacco abuse but has recently quit.   CONSULTATION:  Cardiology, Dr. Dietrich Pates (primary cardiologist is Dr.  Olga Millers).   PROCEDURE:  On April 05, 2008, right carotid endarterectomy with Dacron  patch angioplasty.   BRIEF HISTORY:  [name] is a 77 year old Caucasian female who  previously underwent left carotid endarterectomy in August 2009.  Dr.  Darrick Penna has been following her for moderate right internal carotid artery  stenosis which has been asymptomatic.  Recent carotid duplex showed no  significant restenosis of the left internal carotid artery.  However,  the right internal carotid artery had progressed and was now greater  than 80% with peak systolic velocity of 515 cm/sec with end-diastolic  velocity of 158 cm/sec.  Dr. Darrick Penna recommended right carotid  endarterectomy to  reduce her risk for future stroke.   HOSPITAL COURSE:  [name] was electively admitted to [facility] on April 05, 2008.  She underwent the previously mentioned  procedure.  Intraoperative findings showed extensive common carotid and  carotid bifurcation occlusive disease with greater than 80% stenosis of  the internal carotid and greater than 50% of the common carotid.  This  required a long Dacron patch for closure.  There was some difficulty in  placing the shunt intraoperatively because of recurrent air noted and  some blood loss occurred, but postoperatively she was extubated  neurologically intact.  However, shortly thereafter, she did complain of  chest pain.  She also developed hypotension and she required a Neo-  Synephrine and albumin.  EKG was done which showed ST depression in the  anterior leads.  Stat labs were done and did reveal drop in her  hemoglobin to 7.5.  This was down from 13 from her baseline.  She  received 2 units of packed red blood cells and when evaluated by  Cardiology, symptoms had resolved.  She was also weaned off pressors,  and she was transferred to  Step-Down Unit 3300 postoperatively and  remained there until discharge.  We did send serial cardiac enzymes  which showed a negative troponin peaking at 0.04, negative MBs peaking  at 3, and did have some elevated CKs peaking at 253, but felt more  likely related to her surgery.  Followup EKG showed normal sinus rhythm  with improvement in ST changes noted in the previous EKG.  She was seen  by Cardiology who felt she would be appropriate for discharge from their  standpoint with plans for outpatient followup in 2-4 weeks and  eventually an outpatient Myoview study after she recovers from her  carotid endarterectomy.  Her blood pressure was still borderline ranging  from systolic ranging from 90 to about 126, but was off pressors.  We  held her antihypertensive and diuretic medication with plans  for to  resume this on postoperative day #2.  Neurologically, again she remained  intact.  Her incisions showed no evidence of hematoma and ultimately  felt appropriate for discharge home on April 06, 2008.  She was felt to  be in stable and improving condition.   DISCHARGE MEDICATIONS:  1. Metoprolol ER 50 mg p.o. q.a.m., to resume on March 24.  2. Klor-Con 20 mEq daily.  3. Lasix 40 mg daily, to resume on March 24.  4. Aspirin 81 mg daily.  5. Enalapril 20 mg q.p.m., to resume on March 24.  6. Lipitor 80 mg p.o. daily.  7. Percocet 5/325 mg 1-2 tablets p.o. q.4 h p.r.n. pain.   DISCHARGE INSTRUCTIONS:  Continue heart-healthy diet.  May shower and  clean incisions gently with soap and water.  See Dr. Darrick Penna in 2-3  weeks.  Should call sooner if she has fever greater than 101, redness or  drainage from incision site, severe headache, or new neurologic changes.  She is also to contact Dr. Lewayne Bunting office to schedule  outpatient followup in 2-4 weeks.   LABORATORY DATA:  At discharge showed a white count of 6.4, hemoglobin  and hematocrit improved after transfusion up to 10.6 and 31.0, and  platelet count 114.  Sodium 137, potassium 3.6, BUN of 15, creatinine  0.86, and blood glucose 104.  Cardiac enzymes as mentioned previously  and postoperative chest x-ray report showed post CABG and mitral valve  replacement, COP noted, but no definite congestive heart failure or  pneumonia.      Jerold Coombe, P.A.      Janetta Hora. Fields, MD  Electronically Signed    AWZ/MEDQ  D:  04/06/2008  T:  04/07/2008  Job:  [id number]   cc:   [name]. Jens Som, MD, [facility]  Nani Gasser, M.D.

## 2010-05-30 NOTE — Assessment & Plan Note (Signed)
OFFICE VISIT   Wauters, Chamia B  DOB:  1932-07-12                                       09/17/2007  CHART#:[id number]   The patient underwent left carotid endarterectomy on 09/04/2007.  She  returns for followup today.  She has had no difficulty since her  discharge from the hospital and had an uneventful hospital stay.   On exam today blood pressure is 159/85 in the left arm, heart rate 70  and regular.  Left neck incision is healing well.  Tongue is midline.  Swallow is intact.  She has some peri incisional numbness but this is  minimal.  Upper extremity and lower extremity motor strength is 5/5.  She continues to take her aspirin daily.   Overall she looks well.  She will follow up in 6 months' time for repeat  carotid duplex exam.   Janetta Hora. Fields, MD  Electronically Signed   CEF/MEDQ  D:  09/18/2007  T:  09/18/2007  Job:  [id number]   cc:   Nani Gasser, M.D.  [name] Jens Som, MD, [facility]

## 2010-05-30 NOTE — Assessment & Plan Note (Signed)
[facility]                            CARDIOLOGY OFFICE NOTE   NAME:[name], [name]                         MRN:          [id number]  DATE:09/18/2006                            DOB:          10-10-1932    [name] is a very pleasant female, who has a history of mitral valve  prolapse with severe mitral regurgitation, as well as cerebrovascular  disease.  She is status post coronary artery bypass and graft and mitral  valve repair on May 22, 2006.  Note:  She did have ligation of her left  atrial appendage and closure of patent foramen ovale, as well.  Postoperatively, she developed some atrial fibrillation that improved  with amiodarone.  We did perform an echocardiogram following discharge  for a baseline study.  This was on June 24, 2006.  The LV function was  normal.  There was no mitral stenosis or regurgitation.  There was mild  aortic insufficiency.  There was a question of lesion in the right  atrium of uncertain significance.  I did review this with Dr. Cornelius Moras.  He  felt this may represent thrombus, either in or around the right atrium.  She has, therefore, been maintained on Coumadin.  A followup  echocardiogram on July 9 showed no change in this lesion.   Since I last saw her, she denies any dyspnea, chest pain, palpitations  or syncope.   MEDICATIONS INCLUDE:  1. A baby aspirin 81 mg p.o. daily.  2. Coumadin as directed.  3. Enalapril 20 mg p.o. daily.  4. Lipitor 20 mg p.o. daily.  5. Toprol 25 mg p.o. daily.  6. Lasix 40 mg p.o. daily.  7. Potassium 20 mEq p.o. daily.   PHYSICAL EXAM:  Physical exam today shows a blood pressure of 188/90 in  her left arm and 174/91 in the right arm.  Her pulse was 68.  HEENT:  Normal.  NECK:  Supple.  CHEST:  Clear.  CARDIOVASCULAR EXAM:  Revealed a regular rate and rhythm.  There are no  murmurs noted.  Her sternotomy is without evidence of infection.  ABDOMINAL EXAM:  Benign.  EXTREMITIES:  Show  no edema.   DIAGNOSES:  1. Status post mitral valve repair:  The patient will need to continue      with SBE prophylaxis.  2. Coronary artery disease:  She will continue on her aspirin, statin,      ACE inhibitor and beta blocker.  3. Cerebrovascular disease:  She has been followed by Dr. Darrick Penna for      this issue.  4. Question right atrial mass:  This has been felt possibly secondary      to where she was cannulized for her surgery.  She will continue on      her Coumadin.  We will plan on repeating an echocardiogram in      October.  If this is resolving, she will discontinue her Coumadin      at that time.  5. History of postoperative atrial fibrillation:  She remains in sinus  rhythm on exam and her amiodarone has been discontinued.  6. Hypertension:  Her blood pressure is elevated today, but she has      tracked this at home and it has been well-controlled.  We will      continue with her present medication and I will check a BMET when      she returns in six weeks for her echocardiogram.  7. Hyperlipidemia:  We have asked her to increase her Lipitor to 40 mg      p.o. daily.  We will check lipids and liver in six weeks and adjust      with a goal LDL of less than 70.  8. Tobacco abuse:  Now resolved.   We will see her back in three months.     [name] Jens Som, MD, [facility]  Electronically Signed    BSC/MedQ  DD: 09/18/2006  DT: 09/18/2006  Job #: [id number]   cc:   Nani Gasser, M.D.

## 2010-05-30 NOTE — Assessment & Plan Note (Signed)
[facility]                            CARDIOLOGY OFFICE NOTE   NAME:Knudtson, Rindi B                         MRN:          [id number]  DATE:01/21/2008                            DOB:          Apr 20, 1932    Mrs. Mehler is a pleasant female who has a history of mitral valve  repair in May 2008 secondary to severe mitral regurgitation/mitral valve  prolapse.  She also had coronary bypassing graft at that time with a  LIMA to the LAD and a saphenous vein graft to the ramus intermedius.  She also had closure of patent foramen ovale and ligation of the left  atrial appendage.  Her most recent echocardiogram was performed on  October 29, 2006.  She had normal LV function.  There was mild aortic  insufficiency.  There is only trivial mitral regurgitation.  The left  atrium was mildly dilated as was the right atrium.  The right ventricle  was mildly dilated.  There was a probable eustachian valve or Chiari  network in the right atrium.  Since I last saw her, she is doing well  symptomatically.  There is no significant dyspnea on exertion,  orthopnea, PND, pedal edema, palpitations, or syncope.  She does have  some chest tightness with more extreme activities but not with routine  activities or rest.  This has been present ever since her bypass surgery  and is unchanged in frequency or severity.   MEDICATIONS:  1. Baby aspirin 81 mg daily.  2. Enalapril 200 mg p.o. daily.  3. Metoprolol ER 25 mg p.o. daily.  4. Lasix 40 mg p.o. daily.  5. Potassium 20 mEq p.o. daily.  6. Lipitor 20 mg p.o. daily.   PHYSICAL EXAMINATION:  VITAL SIGNS:  Blood pressure 196/74 and a pulse  of 74.  She weighs 141 pounds.  HEENT:  Normal.  NECK:  Supple.  CHEST:  Clear.  CARDIOVASCULAR:  Regular rate and rhythm.  There are no murmurs noted.  ABDOMEN:  No tenderness.  EXTREMITIES:  No edema.   Her electrocardiogram shows a sinus rhythm at a rate of 74.  The axis is  normal.   There are no significant ST changes noted.   DIAGNOSES:  1. Status post mitral valve repair - Ms. Flitton is doing well from      symptomatic standpoint.  She will continue with subacute bacterial      endocarditis prophylaxis.  We will plan to see her back in 1 year      and repeat her echocardiogram at that time.  2. Coronary artery disease status post coronary artery bypassing graft      - she will continue on aspirin, statin, ACE inhibitor, and beta-      blocker.  She does have mild chest tightness with more extreme      activities but not with routine activities.  However, this has been      present since her bypass surgery.  If it worsens in the future then      we can proceed with further workup at that  point.  3. Cerebrovascular disease - she is being followed Dr. Darrick Penna and is      status post left carotid endarterectomy.  4. History of question right atrial mass following surgery - her      followup echocardiogram did not show any evidence of this.  5. History of hypertension - her blood pressure remains elevated today      and it was elevated at home when she tracks it.  We will increase      her Toprol to 50 mg p.o. daily.  We will adjust her regimen as      indicated.  I will check a BMET and follow with potassium and renal      function.  6. Hyperlipidemia - she will continue on her statin and we will check      lipids and liver and adjust as indicated.  7. History of tobacco abuse - she has now been discontinued for 2      years.   We will see her back in 1 year or sooner if necessary.     [name] Jens Som, MD, [facility]  Electronically Signed    BSC/MedQ  DD: 01/21/2008  DT: 01/22/2008  Job #: [id number]

## 2010-05-30 NOTE — Assessment & Plan Note (Signed)
[facility]                               LIPID CLINIC NOTE   NAME:[name], [name]                         MRN:          [id number]  DATE:06/06/2006                            DOB:          [birth year]    TELEPHONE DOCUMENTATION:  I spoke with Marcelino Duster on the Red Team at  [facility] today at 4:10 to follow up on blood work that I had  not received on Miss [name] regarding her prothrombin time which was  supposed to have been drawn on Jun 01, 2006.  We gave orders for this  blood work to be drawn on May 31, 2006, and it is still in process.  Marcelino Duster will try to find it and send it to [place].  In the meantime, I have  given orders for a STAT PT INR to be drawn tomorrow, Jun 07, 2006, with  follow up to the patient and to me for further dosing.      Shelby Dubin, PharmD, BCPS, CPP  Electronically Signed      Rollene Rotunda, MD, [facility]  Electronically Signed   MP/MedQ  DD: 06/06/2006  DT: 06/06/2006  Job #: [id number]

## 2010-05-30 NOTE — Procedures (Signed)
CAROTID DUPLEX EXAM   INDICATION:  Follow-up evaluation of known carotid artery disease.   HISTORY:  Diabetes:  No.  Cardiac:  Coronary artery bypass graft in July, 2008 by Dr. Cornelius Moras.  Hypertension:  Yes.  Smoking:  Quit in February, 2008.  Previous Surgery:  No.  CV History:  Patient reports no cerebrovascular symptoms at this time.  Previous cerebrovascular study performed on August 09, 2006 revealed  bilateral 60-79% ICA stenosis and an occluded left vertebral.  Amaurosis Fugax No, Paresthesias No, Hemiparesis No                                       RIGHT             LEFT  Brachial systolic pressure:         190               196  Brachial Doppler waveforms:         Triphasic         Triphasic  Vertebral direction of flow:        Antegrade         Antegrade  DUPLEX VELOCITIES (cm/sec)  CCA peak systolic                   58                48  ECA peak systolic                   480               156  ICA peak systolic                   254               283  ICA end diastolic                   88                74  PLAQUE MORPHOLOGY:                  Calcified, irregular                Calcified, irregular  PLAQUE AMOUNT:                      Moderate          Moderate  PLAQUE LOCATION:                    CCA, proximal ICA CCA, proximal ICA   IMPRESSION:  1. 60-79% internal carotid artery stenosis bilaterally.  2. Bilateral external carotid artery stenosis, right worse than left.  3. Left vertebral artery was identified with antegrade flow.  4. No significant change since previous study performed on August 09, 2006.   ___________________________________________  Janetta Hora. Fields, MD   MC/MEDQ  D:  01/29/2007  T:  01/29/2007  Job:  [id number]

## 2010-05-30 NOTE — Discharge Summary (Signed)
[name], [name]                  ACCOUNT NO.:  [id number]   MEDICAL RECORD NO.:  [id number]          PATIENT TYPE:  INP   LOCATION:  2008                         FACILITY:  [facility]   PHYSICIAN:  Theda Belfast, PA DATE OF BIRTH:  12-05-1932   DATE OF ADMISSION:  05/22/2006  DATE OF DISCHARGE:                               DISCHARGE SUMMARY   PRIMARY DIAGNOSIS:  1. Mitral valve prolapse with severe mitral regurgitation.  2. Left main stenosis.  3. Patent foramen ovale.   HOSPITAL DIAGNOSIS.:  1. Postoperative atrial fibrillation.  2. Postoperative acute blood loss anemia.  3. Volume overload postoperatively.  4. Postoperative confusion.   INHOSPITAL OPERATIONS AND PROCEDURES:  1. Hypertension.  2. Hyperlipidemia.  3. Cerebrovascular disease with asymptomatic bilateral internal      carotid artery stenosis estimated 60 to 80% on both sides but the      duplex exam.  4. Longstanding tobacco abuse, quit in February 2008.  5. Status post partial hysterectomy.   INHOSPITAL OPERATIONS AND PROCEDURES:  1. Mitral valve repair using a quadrangular resection posterior      leaflet with sliding leaflet annuloplasty and 24 mm Edwards physio      ring.  2. Coronary bypass grafting x2 using a left internal mammary artery to      distal left anterior descending coronary artery, saphenous vein      graft to ramus intermediate branch.  Endoscopic saphenous vein      graft done in the right leg.  3. Closure of patent foramen ovale.  4. Ligation left atrial appendage.   The patient's history and physical and hospital course.  The patient 77-  year-old female with past medical history notable for history of  hypertension, hyperlipidemia, and cerebrovascular disease.  The patient  was noted to have a heart murmur on physical exam.  Echocardiogram was  performed demonstrate mitral valve prolapse with severe 4+ mitral  regurgitation.  Transesophageal echocardiogram confirmed presence of  severe mitral regurgitation of flail segment posterior leaf of the  mitral valve.  Left ventricular function was normal.  Cardiac  catheterization revealed left main coronary artery stenosis with  otherwise no significant flow limiting coronary artery stenosis.  There  is moderate pulmonary hypertension.  Following echocardiogram and  catheterization Dr. Cornelius Moras was consulted.  Dr. Cornelius Moras saw and evaluated the  patient.  He discussed with the patient undergoing coronary bypass  grafting as well as mitral valve repair.  Risks and benefits were  discussed with the patient.  The patient acknowledged understanding and  agreed to proceed.  Surgery was scheduled for May 22, 2006.  For details  of the patient's past medical history and physical exam please see  dictated H&P.   The patient was taken to the operating room May 22, 2006 where she  underwent coronary artery bypass grafting x2 using a left internal  mammary artery to distal left anterior descending coronary artery,  saphenous vein graft to ramus intermediate branch.  Endoscopic saphenous  vein harvest was done from the right thigh.  The patient also underwent  mitral valve  repair with quadrangular resection posterior leaflet with  sliding leaflet annuloplasty in 24 mm Edwards physio ring.  She also had  closure of patent foramen ovale and ligation of left atrial appendage.  The patient tolerated this procedure well, was transferred to the  intensive care unit in stable condition.  Immediately following surgery  the patient noted to be hemodynamically stable.  She was extubated the  morning after surgery.  Following extubation the patient noted to be  alert and oriented x4.  During the patient's postoperative course she  did develop postoperative confusion /delirium.  All narcotics were DC'd.  There were no focal findings.  The patient was monitored closely.  By  postop day four the patient's confusion started to resolve.  By postop  day #5  the patient was alert and oriented x4.  She was continued to be  followed closely.  Also during the patient's postoperative course she  did have one brief episode of atrial fibrillation.  The patient was  started on IV amiodarone and converted back to normal sinus rhythm.  She  was then switched to p.o. amiodarone remained in normal sinus rhythm for  the remainder of her postoperative course.  The patient also developed  acute blood loss anemia with hemoglobin hematocrit dropping to 8.7 and  25 postop day #1.  The patient was asymptomatic.  Hemoglobin/hematocrit  were followed closely.  Postop day #4 it dropped to 8.4 and 24.3.  The  patient remained asymptomatic and CBC will be rechecked prior to  discharge.  The patient also developed volume overload postoperatively.  She was started on diuretics.  The patient was back near baseline weight  prior to discharge home.  The patient was transferred up to 2000 postop  day #4 in stable condition.  Prior to transfer chest tubes and lines  were D&Cd in normal fashion.  She was weaned from all drips.  Postoperatively the patient was ambulating well with cardiac rehab.  Pulmonary status was stable.  Vital signs were followed closely.  The  patient noted to be afebrile postoperatively.  Close continuation of  cardiac rehab and use of incentive spirometer.  The patient was able to  be weaned off oxygen satting greater than 90% on room air.  The  patient's blood sugars were followed closely although patient with no  history of diabetes mellitus.  Blood sugars were stable.  The patient  was tolerating diet well.  No nausea, vomiting noted.   Patient is tentatively ready for discharge home in the next 1-2 days.   FOLLOW-UP APPOINTMENTS:  An appointment will be made with Dr. Cornelius Moras for  in 3 weeks.  Our office will contact the patient with this information.  The patient need to follow up with Dr. Jens Som in 2 weeks.  The patient also need to follow up  with Dr. Jens Som as office for PT/INR level in 2  days post discharge.  She will need to contact Dr. Jens Som office for  both of these appointments.   ACTIVITY:  Patient instructed no driving until released to do so, no  lifting over 10 pounds.  The patient is told to ambulate 3 to 4 times  per day.  Progress as tolerated and to continue her breathing exercises.   Incisional care.  The patient is told she is allowed to shower washing  her incisions using soap and water.  She is to contact the office if she  develops any drainage or opening from any of her incision  sites.   Diet.  The patient educated on diet to be low-fat, low-salt.   DISCHARGE MEDICATIONS:  1. Aspirin 81 mg daily.  2. Toprol XL 25 mg daily.  3. Lipitor 20 mg at night.  4. Amiodarone 20 mg daily.  5. Coumadin will be dosed based on the patient's discharge PT/INR      level.  6. Lasix 40 mg daily times 5 days.  7. Potassium chloride 20 mEq daily times 5 days.  8. Enalapril 10 mg daily.  9. Ultram 50 mg 1-2 tablets q.4-6 h p.r.n. pain.      Theda Belfast, PA     KMD/MEDQ  D:  05/27/2006  T:  05/27/2006  Job:  [id number]   cc:   [name]. Jens Som, MD, [facility]

## 2010-05-30 NOTE — Procedures (Signed)
CAROTID DUPLEX EXAM   INDICATION:  Followup of known carotid artery disease.  The patient is  currently asymptomatic.   HISTORY:  Diabetes:  No.  Cardiac:  CABG in July 2008 by Dr. Cornelius Moras.  Hypertension:  Yes.  Smoking:  No.  Previous Surgery:  CV History:  Amaurosis Fugax No, Paresthesias No, Hemiparesis No                                       RIGHT             LEFT  Brachial systolic pressure:         174               180  Brachial Doppler waveforms:         Triphasic         Triphasic  Vertebral direction of flow:        Antegrade         Antegrade  (atypical)  DUPLEX VELOCITIES (cm/sec)  CCA peak systolic                   251 (D)           227 (D)  ECA peak systolic                   453               236  ICA peak systolic                   262               462  ICA end diastolic                   73                132  PLAQUE MORPHOLOGY:                  Irregular, calcific with shadowing  Irregular, calcific with  shadowing  PLAQUE AMOUNT:                      Moderate - severe Critical  PLAQUE LOCATION:                    DCCA, ICA, ECA    DCCA, ICA, ECA   IMPRESSION:  1. Diffuse plaquing noted throughout bilateral CCAs.  2. Right DCCA with a >50% stenosis which extends into the proximal ICA      causing a 60-79% stenosis.  3. Left DCCA with a >50% stenosis which extends into the proximal ICA      causing an 80-99% stenosis.  Highest velocity noted just distal to      the origin.  4. Extensive acoustic shadowing may have obscured high velocities in      bilateral ICAs.  5. Bilateral ECA stenoses, left > right.  6. Left vertebral artery demonstrates early systolic deceleration      which is suggestive of subclavian stenosis.     ___________________________________________  Janetta Hora Fields, MD   PB/MEDQ  D:  07/30/2007  T:  07/30/2007  Job:  [id number]

## 2010-05-30 NOTE — Procedures (Signed)
CAROTID DUPLEX EXAM   INDICATION:  Followup known carotid artery disease and bilateral carotid  endarterectomies.   HISTORY:  Diabetes:  No.  Cardiac:  CABG.  Hypertension:  Yes.  Smoking:  No.  Previous Surgery:  Bilateral carotid endarterectomies.  CV History:  No.  Amaurosis Fugax No, Paresthesias No, Hemiparesis No                                       RIGHT             LEFT  Brachial systolic pressure:         200               210  Brachial Doppler waveforms:         Biphasic          Biphasic  Vertebral direction of flow:        Antegrade         Antegrade  DUPLEX VELOCITIES (cm/sec)  CCA peak systolic                   315               110  ECA peak systolic                   Occluded/branch 40                  152  ICA peak systolic                   134               116  ICA end diastolic                   36                23  PLAQUE MORPHOLOGY:                  There is intimal thickening and  there is also calcific              Calcific  PLAQUE AMOUNT:                      Moderate          Mild  PLAQUE LOCATION:                    CCA, ICA and ECA  ECA and CCA   IMPRESSION:  1. There is a 40%-59% stenosis noted in the right internal carotid      artery.  Intimal thickening is noted in the right common carotid      artery with area of stenosis in the right proximal common carotid      artery.  2. The right external carotid artery appears to have occluded with a      branch showing some low flow.  3. 1%-39% stenosis noted in the left internal carotid artery.  4. Antegrade bilateral vertebral arteries.   ___________________________________________  Janetta Hora Fields, MD   NT/MEDQ  D:  04/27/2009  T:  04/27/2009  Job:  [id number]

## 2010-05-30 NOTE — Assessment & Plan Note (Signed)
[facility]                            CARDIOLOGY OFFICE NOTE   NAME:[name], [name]                         MRN:          [id number]  DATE:01/22/2007                            DOB:          1932/10/11    Mrs. Zullo is a pleasant female who has a history of mitral valve  repair on May 22, 2006 secondary to severe mitral regurgitation/mitral  valve prolapse.  She also has a history of coronary disease and had  coronary bypassing graft that time as well.  She has done well  postoperatively.  There was note of mass in the right atrium or  surrounding right atrium on the procedure and was felt this may be  related to thrombus.  She was on Coumadin but we did perform a followup  echocardiogram on October 29, 2006.  At that time her LV function was  normal.  There is mild aortic insufficiency.  There appeared to be a  eustachian valve or Chiari network in the right atrium.  We have  discontinued her Coumadin since then.  Since I last saw her, she denies  any dyspnea, chest pain, palpitations or syncope.  There is no pedal  edema.   MEDICATIONS:  1. Include aspirin 81 mg p.o. daily.  2. Enalapril 20 mg daily.  3. Lipitor 40 mg daily.  4. Toprol 25 mg daily.  5. Lasix 40 mg daily.  6. Potassium 20 mEq p.o. daily.   PHYSICAL EXAM:  Shows a blood pressure 176/85 and pulse 75.  She weighs  128 pounds.  HEENT:  Normal.  NECK:  Supple.  CHEST:  Clear.  CARDIOVASCULAR:  Regular rhythm.  Her abdominal exam shows no tenderness.  EXTREMITIES:  Show no edema.   Her electrocardiogram shows sinus rhythm at a rate of 70.  The axis is  normal.  There are no significant ST changes.   DIAGNOSES:  1. Status post mitral valve repair - she is doing well.  We will      continue subacute bacterial endocarditis prophylaxis.  2. Coronary disease status post bypass graft - she has had no      exertional chest pain.  We will continue with her aspirin, statin,      ACE  inhibitor, beta blocker.  3. Cerebrovascular disease - she is being followed by Dr. Darrick Penna for      this issue.  4. History of question right atrial mass - Mrs. Vialpando is now off her      Coumadin and her followup echocardiogram had improved.  We will      plan to repeat her echocardiogram now she is off Coumadin in      February.  If the mass is gone, we will not pursue this further.  5. History of postoperative atrial fibrillation - she remains in sinus      rhythm.  6. Hypertension - blood pressures elevated today but she has tracked      home and it typically runs in the 110-120 range over 60-70 range.      We will therefore continue  her present medications.  I will check a      BMET when she comes liver functions to follow her potassium and      renal function given her diuretic and ACE inhibitor use.  7. Hyperlipidemia - she will continue on Lipitor.  Her recent liver      functions were minimally elevated and we are planning to repeat      those next week.  8. Tobacco abuse - she has now been off of this for 1 year.   She will continue risk factor modification.  We will see her back in 1  year.     [name] Jens Som, MD, [facility]  Electronically Signed    BSC/MedQ  DD: 01/22/2007  DT: 01/22/2007  Job #: [id number]   cc:   Nani Gasser, M.D.

## 2010-05-30 NOTE — Consult Note (Signed)
[name], [name]                  ACCOUNT NO.:  [id number]   MEDICAL RECORD NO.:  [id number]          PATIENT TYPE:  INP   LOCATION:  3305                         FACILITY:  [facility]   PHYSICIAN:  Pricilla Riffle, MD, FACCDATE OF BIRTH:  May 08, 1932   DATE OF CONSULTATION:  04/05/2008  DATE OF DISCHARGE:                                 CONSULTATION   IDENTIFICATION:  [name] is a 77 year old who we were asked to see  regarding chest pain.   HISTORY OF PRESENT ILLNESS:  The patient has known coronary artery  disease.  She is status post CABG with mitral valve repair in 2008.   She was brought in today for carotid endarterectomy (right).  Postoperatively, she was found to be hypotensive.  At that time, she  also complained of chest pain.   The patient had an EKG done that showed ST depression in the anterior  leads.  Stat lab revealed a hemoglobin of 7.5.  The patient is currently  undergoing blood transfusion and symptoms have resolved.  She was  transiently given Neo-Synephrine and 250 mg albumin fluids and again  blood pressure improved.  She is off pressors now.   She currently is symptom free.   ALLERGIES:  None.   MEDICATIONS PRIOR TO ADMISSION:  1. Aspirin 81 mg daily.  2. Enalapril 20.  3. Metoprolol 25.  4. Lasix 20.  5. Potassium 20 mEq.  6. Lipitor 20.   PAST MEDICAL HISTORY:  1. CAD status post CABG in May 2008 (LIMA to LAD; SVG to ramus).  She      is status post closure of patent foramen ovale and ligation of left      atrial appendage.  Also status post mitral valve repair with mitral      annuloplasty.  2. Hypertension.  3. Peripheral vascular disease.   PAST SURGICAL HISTORY:  CABG with mitral valve repair in 2008, left  carotid endarterectomy in 2009.  Today right carotid endarterectomy.   SOCIAL HISTORY:  The patient lives in [place] with her husband,  does not smoke, does not drink.   FAMILY HISTORY:  Mother died of an MI.  Father died of  asthmatic  complications, a sister with CAD.   REVIEW OF SYSTEMS:  All systems reviewed negative to the above problem  except as noted above.   PHYSICAL EXAMINATION:  GENERAL:  The patient is in no acute distress at  rest.  VITAL SIGNS:  Blood pressure 118/41, pulse is 54 and regular.  O2 sat at  2 L is 100%.  HEENT:  Normocephalic, atraumatic.  Right neck though with bandage.  Mucous membranes are dry.  NECK:  Right neck with bandage.  Left with scar of carotid  endarterectomy, mild bruit on the left.  No thyromegaly.  LUNGS:  Clear without rales or wheezes.  CARDIAC:  Regular rate and rhythm, S1-S2.  No S3, S4 or significant  murmurs.  ABDOMEN:  Supple, nontender.  No hepatomegaly.  Normal bowel sounds.  No  masses.  EXTREMITIES:  Good distal pulses throughout.  No lower extremity edema.  CHEST X-RAY:  Status post CABG, mitral valve repair.  A 12-lead EKG  today shows normal sinus bradycardia at 56 beats per minute.  ST  depression 0 to 1 mm in V1 through V6 with T-wave inversion, biphasic T-  waves.   LABORATORY DATA:  Hemoglobin of 7.5, BUN and creatinine of 23 and 0.86.  Troponin less than 0.01, CK-MB of 91 and 1.9   IMPRESSION:  12. A 77 year old with known coronary artery disease with status post      CABG and mitral valve repair in 2008 doing well prior to surgery.  Postop developed chest pain and hypotension transiently on pressors  found to have a hemoglobin of 7.5.  Currently is getting transfused.  Symptoms are gone.  EKG with ischemic changes prior.   Agree that most likely the above is related to demand ischemia in the  setting of anemia.  Would transfuse, watch H&H, admit to telemetry and  hold blood pressure medicines for now.  Check CK troponin.  EKG in a.m.  will follow.  Aspirin if okay from surgical standpoint.  1. Dyslipidemia.  Continue on Lipitor.      Pricilla Riffle, MD, [facility]  Electronically Signed     PVR/MEDQ  D:  04/05/2008  T:  04/06/2008   Job:  [id number]

## 2010-05-30 NOTE — Op Note (Signed)
NAMEAnslee, Micheletti [name]                  ACCOUNT NO.:  [id number]   MEDICAL RECORD NO.:  [id number]          PATIENT TYPE:  INP   LOCATION:  2309                         FACILITY:  [facility]   PHYSICIAN:  Salvatore Decent. Cornelius Moras, M.D. DATE OF BIRTH:  1932/01/26   DATE OF PROCEDURE:  05/22/2006  DATE OF DISCHARGE:                               OPERATIVE REPORT   PREOPERATIVE DIAGNOSIS:  1. Mitral valve prolapse with severe mitral regurgitation.  2. Left main stenosis.   POSTOPERATIVE DIAGNOSIS:  1. Mitral valve prolapse with severe mitral regurgitation.  2. Left main stenosis.   PROCEDURE:  Median sternotomy for mitral valve repair (quadrangular  resection of posterior leaflet with sliding leaflet annuloplasty and 24  mm Edwards Physio ring) and coronary artery bypass grafting x2 (left  internal mammary artery to distal left anterior descending coronary  artery, saphenous vein graft to ramus intermediate branch, endoscopic  saphenous vein harvest from right thigh).   SURGEON:  Salvatore Decent. Cornelius Moras, M.D.   ASSISTANT:  Zadie Rhine, P.A.-C.   ANESTHESIA:  General.   BRIEF CLINICAL NOTE:  The patient is a 77 year old female with a past  medical history notable for history of hypertension, hyperlipidemia, and  cerebrovascular disease.  She was noted to have a heart murmur on  physical exam. An echocardiogram was performed demonstrating mitral  valve prolapse with severe (4+) mitral regurgitation.  Transesophageal  echocardiogram confirmed the presence of severe mitral regurgitation  with flail segment of the posterior leaflet of the mitral valve.  Left  ventricular function was normal.  Cardiac catheterization revealed left  main coronary artery stenosis with, otherwise, no significant flow  limiting coronary artery stenosis.  There is moderate pulmonary  hypertension.  A full consultation has been dictated previously.  The  patient has been counseled at length regarding the indications,  risks,  and potential benefits of surgery.  Alternative treatment strategies  have been discussed.  She understands and accepts all associated risks  of surgery and desires to proceed as described.   OPERATIVE FINDINGS:  1. Mitral valve prolapse due to fibroelastic deficiency with severe      mitral regurgitation including flail segment of the posterior      leaflet (P2).  2. Normal left ventricular systolic function.  3. Mild tricuspid regurgitation.  4. Severe atherosclerotic plaque involving the ascending and      transverse thoracic aorta number.  5. Good quality left internal mammary artery and saphenous vein      conduit for grafting.  6. No residual mitral regurgitation after successful mitral valve      repair.   OPERATIVE NOTE IN DETAIL:  The patient was brought to the operating room  on above mentioned date and central monitoring was established by the  anesthesia service under the care and direction of Dr. Hart Robinsons.  Specifically, a Swan-Ganz catheter was placed through the  right internal jugular approach.  A radial arterial line was placed.  Intravenous antibiotics were administered.  Following induction with  general endotracheal anesthesia, a Foley catheter was placed.  The  patient's chest, abdomen,  both groins, and both lower extremities were  prepared and draped in a sterile manner.   Baseline transesophageal echocardiogram was performed by Dr. Gelene Mink.  Three dimensional images were obtained.  There is normal left  ventricular size and function.  There is a flail segment of the middle  scallop of the posterior leaflet of the mitral valve (P2) with severe  (4+) mitral regurgitation.  There is mild to moderate tricuspid  regurgitation.  The aortic valve appears normal.  No other significant  abnormalities are appreciated.   A median sternotomy incision is performed and the left internal mammary  artery is dissected from the chest wall and prepared for  bypass  grafting.  The left internal mammary artery is a good quality conduit.  Simultaneously, saphenous vein was obtained the patient's right thigh  using endoscopic vein harvest technique.  The saphenous vein is somewhat  large caliber but otherwise a good quality conduit.  After the saphenous  vein is removed, the small incision in the right lower extremity is  closed in multiple layers with running absorbable suture.  The patient  is heparinized systemically.  The left internal mammary artery is  transected distally and is noted to have excellent flow.   The pericardium is opened.  The ascending aorta is moderately to  severely diseased with atherosclerotic plaque.  A suitable site for  cannulation is chosen on the transverse arch beyond the takeoff of the  innominate artery.  The transverse aorta was cannulated uneventfully.  A  venous cannula is placed directly in the superior vena cava.  A second  venous cannula is placed low in the right atrium with the tip extending  down the inferior vena cava.  A retrograde cardioplegic catheter was  placed through the right atrium into the coronary sinus.  Adequate  heparinization is verified.   Cardiopulmonary bypass is begun and the surface of the heart was  inspected.  The distal site is selected for coronary bypass grafting.  Vessel loops were placed around the superior vena cava and the inferior  vena cava.  A cardioplegic catheter is placed in the ascending aorta.  The patient is allowed to cool passively to 30 degrees systemic  temperature.  The aortic crossclamp was applied and cold blood  cardioplegia is administered initially in an antegrade fashion through  the aortic root.  Iced saline slush was applied for topical hypothermia.  Supplemental cardioplegia is administered retrograde through the  coronary sinus catheter.  The initial cardioplegic arrest and myocardial cooling is felt to be excellent.  Repeat doses of cardioplegia  are  administered intermittently throughout the crossclamp portion of the  operation through the aortic root, down the subsequently placed vein  grafts, and retrograde through the coronary sinus catheter to maintain  left ventricular septal temperature below 15 degrees centigrade.   The following distal coronary anastomoses were performed:  1. The ramus intermediate branch, which is the dominant vessel      perfusing the lateral wall of the left ventricle, was grafted with      a saphenous vein graft in an end-to-side fashion.  This vessel      measures 2 mm in diameter.  It is diffusely diseased proximally      with hard palpable plaque.  However, at the site of distal bypass,      it is a good quality vessel.  2. The distal left anterior descending coronary artery is grafted with      the left internal  mammary artery in an end-to-side fashion.  This      vessel was also diffusely diseased but a good quality target vessel      at the site of distal grafting.  It measured 1.5 mm in diameter.   A left atriotomy incision was performed posteriorly through the intra-  atrial groove.  Some blood is noted to be emanating through a small  patent foramen ovale and this was closed with a single 4-0 Prolene  suture.  The mitral valve was exposed using a self-retaining retractor.  Exposure is felt to be excellent.  The mitral valve was carefully  examined.  The mitral valve has classical prolapse involving the middle  scallop (P2) of the posterior leaflet of the mitral valve.  There is  fibroelastic deficiency with relatively thin leaflet tissue.  One of the  primary chordae to the flail segment is torn.  There is no prolapse  involving any of the remaining portion of the mitral valve.  The valve,  overall, is relatively small size.  Interrupted 2-0 Ethibond horizontal  mattress sutures were placed circumferentially around the mitral  annulus.  The sutures will later be utilized for ring  annuloplasty, and  at this point, they are utilized to facilitate exposure of the valve.  A  quadrangular resection of the flail segment of the middle scallop (P2)  is performed.  The amount of resected tissue is relatively small,  measuring less than 1 cm in transverse diameter.  However, despite the  relatively small quadrangular resection, sliding leaflet annuloplasty is  felt necessary due to the relatively small P1 and P3 components of the  posterior leaflet as is commonly found with fibroelastic deficiency type  valve.  As such, the remaining P1 and P3 portions of the leaflet are  mobilized off the posterior annulus.  The posterior annulus was then  shortened using interrupted figure-of-eight horizontal mattress 2-0  Ethibond compression sutures.  The remaining posterior leaflet is then  reapproximated to the posterior annulus with a two layer closure of  running 4-0 Ethibond suture.  The intervening vertical defect in the posterior leaflet is then closed using everting 5-0 Ethibond simple  interrupted sutures.  The valve was not tested for competence.  There is  a fairly large cleft between P2 and P3 and this cleft is also closed  with a couple of interrupted 5-0 Ethibond everting sutures.   Ring annuloplasty is now performed using an Edwards Physio ring, size 24  mm (model number [id number], serial number [id number]).  This size corresponds  precisely to the anatomical size of the anterior leaflet of the mitral  valve.  After the annuloplasty is completed, the valve was tested for  competence and appears to be perfectly competent.  There is a broad  symmetrical line of coaptation of the anterior posterior leaflets with  relatively large amount of surface area of coaptation.  Rewarming is  begun.   The left atrial appendage was oversewn from within the left atrium using  a two-layer closure of running 3-0 Prolene suture.  The left atriotomy  incision is now closed using a two layer  closure of running 3-0 Prolene  suture.  The single proximal saphenous vein anastomoses is performed  directly to the ascending aorta prior to removal of the aortic  crossclamp.  The patient is placed in Trendelenburg position.  The lungs  were ventilated and the heart allowed to fill to evacuate any residual  air through the aortic root.  Left  ventricular temperature rises rapidly  with reperfusion of the left internal mammary artery.  One final dose of  warm retrograde hot shot cardioplegia is administered.  The aortic  crossclamp was removed after a total crossclamp time of 150 minutes.   The heart begins to beat spontaneously without need for cardioversion.  The retrograde cardioplegic catheter was removed.  All proximal and  distal coronary anastomoses were inspected for hemostasis and  appropriate graft orientation.  Epicardial pacing wires were affixed to  the right ventricular outflow tract into the right atrial appendage.  The patient is rewarmed to 37 degrees centigrade temperature.  The IVC  cannula is removed and its cannulation site oversewn with Prolene  suture.  The patient is weaned from cardiopulmonary bypass without  difficulty.  The patient's rhythm at separation from bypass is normal  sinus rhythm.  No inotropic support is required.  Total cardiopulmonary  bypass time for the operation is 170 minutes.   Follow up transesophageal echocardiogram performed by Dr. Gelene Mink  after separation from bypass demonstrates a well seated mitral  annuloplasty ring with normal functioning mitral valve and no residual  mitral regurgitation.  Left ventricular function remains normal.  There  is no significant residual air.  No other abnormalities are noted.   The venous and arterial cannulae are removed uneventfully.  Protamine is  administered to reverse the anticoagulation.  The mediastinum and left  chest are irrigated with saline solution containing vancomycin. Meticulous  surgical hemostasis is ascertained.  The patient is  transfused one pack of adult platelets due to severe thrombocytopenia  with platelet count 50,000 during cardiopulmonary bypass.  The patient  also was transfused 2 units packed red blood cells during  cardiopulmonary bypass due to anemia which was present preoperatively  and exacerbated by surgery.   The mediastinum and both left and right pleural spaces were drained with  four chest tubes exited through separate stab incisions inferiorly.  The  pericardium and soft tissues anterior to the aorta are reapproximated  loosely.  The sternum was closed with single strength sternal wire.  The  soft tissues anterior to the sternum are closed in multiple layers and  the skin is closed with a running subcuticular skin closure.   The patient tolerated the procedure well and was transported to the  surgical intensive care unit in stable condition.  There are no  intraoperative complications.  All sponge, instrument and needle counts  were verified as correct at the completion of the operation.      Salvatore Decent. Cornelius Moras, M.D.  Electronically Signed     CHO/MEDQ  D:  05/22/2006  T:  05/22/2006  Job:  [id number]   cc:   [name]. Jens Som, MD, [facility]  Nani Gasser, M.D.

## 2010-05-30 NOTE — Assessment & Plan Note (Signed)
OFFICE VISIT   [name], [name]  DOB:  08-24-1932                                        July 08, 2006  CHART #:  [id number]   The patient returns to the office today for routine followup, status  post mitral valve repair and coronary artery bypass grafting x2 on  05/22/2006.  Her postoperative recovery has been relatively uneventful.  She did have some brief episodes of paroxysmal atrial fibrillation while  she remained in the hospital, although she was discharged home in sinus  rhythm on amiodarone and Coumadin.  She also had a symptomatic right  pleural effusion for which she underwent needle thoracentesis prior to  hospital discharge.  Since then she has done quite well.  She has been  seen in followup by Dr. Jens Som on May 21, and she continues to have  her prothrombin time monitored and Coumadin dose adjusted through the  [facility].  She returns to the office today for a routine  followup.  The patient feels quite well.  She states that she already  notes a difference between prior to surgery in terms of improved  exercise tolerance.  She has had no shortness of breath.  She has had no  palpitations or dizzy spells.  She has had remarkably little pain in her  chest.  Her appetite is good.  Her activity level is well.  She does not  plan to do the cardiac rehab program due to the distance involved with  driving.  She is walking at least once daily.  She feels great and has  no complaints.   MEDICATIONS:  Remain unchanged from the time of hospital discharge with  the exception of the fact that her Coumadin dose has been adjusted.   PHYSICAL EXAMINATION:  Is notable for a well-appearing female with blood  pressure 157/89, pulse 70, oxygen saturation is 98% on room air.  Examination of the chest reveals a median sternotomy incision that has  healed nicely.  The sternum is stable on palpation.  Breath sounds are  clear to auscultation  bilaterally.  No wheeze or rhonchi are noted.  Cardiovascular:  Regular rate and rhythm.  No murmurs, rubs, or gallops  are appreciated.  Abdomen:  Soft, nontender.  Extremities:  Warm and  well-profused.  The small incision from endoscopic vein harvest in the  right thigh is healed nicely.  There is no lower extremity edema.  The  remainder of her physical exam was unrevealing.   DIAGNOSTIC TESTS:  Chest x-ray obtained June 16 at the [facility]  Cardiology offices reviewed.  This demonstrates no residual pleural  effusions at all.  There are changes of chronic obstructive pulmonary  disease which are chronic and unchanged from previously.  All the  sternal wires appear intact.  No other abnormalities are noted.   A 2D echocardiogram performed at the [facility] Cardiology office June 9 is  reviewed.  This demonstrates normal left ventricular function with  ejection fraction estimated 55%.  The mitral valve looks good with a  well-seated mitral annuloplasty ring.  There is no mitral regurgitation.  There is no significant mitral stenosis.  There is mild tricuspid  regurgitation.  There is no pericardial effusion.  On one view there is  questionable mass seen in or around the right atrium of unclear  significance.  Given the patient's recent surgery I suspect this must  represent some thrombus that is either in or around the right atrium.  In all likelihood it is external to the atrium, although this can not be  definitively established.  No other abnormalities are noted.   IMPRESSION:  Excellent progress, now approximately 6 weeks status post  mitral valve repair and coronary artery bypass grafting x2.  The patient  appears to be doing very well clinically.  Her blood pressure is running  a little bit high today, but I am reluctant to adjust any of her  hypertensive medicines at this juncture and I have suggested that she  just continue to monitor her blood pressure carefully for that time   being.   PLAN:  I have encouraged the patient to gradually increase her activity  as tolerated with her only limitation at this point remaining that she  refrain from heavy lifting or strenuous use of her arms and shoulders  for another 6 weeks.  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  I have encouraged her to continue to increase her activity  and given that she is not going to participate in the cardiac rehab  program I have suggested that she create her own program for monitoring  her exercise tolerance and make sure that she goes for good walks at  least once if not twice every day.  All of her questions have been  addressed.  I have suggested that she should not refill her prescription  for amiodarone and once it expires I would stop taking it entirely.  I  would favor keeping her on Coumadin for probably 3 months, although  obviously a repeat echocardiogram would be prudent to make sure there is  no sign of thrombus within the right atrium.  Again this seems unlikely,  but given the appearance of the recent echo a followup echo is certainly  appropriate.  We will plan to see the patient back for further followup  to make sure she continues to do well in 6 months.  Otherwise she will  call and return to see [place] if needed.   Salvatore Decent. Cornelius Moras, M.D.  Electronically Signed   CHO/MEDQ  D:  07/08/2006  T:  07/08/2006  Job:  [id number]   cc:   [name]. Jens Som, MD, [facility]  Nani Gasser, M.D.

## 2010-05-30 NOTE — Procedures (Signed)
CAROTID DUPLEX EXAM   INDICATION:  Followup carotid artery disease.   HISTORY:  Diabetes:  No  Cardiac:  CABG in July 2008, by Dr. Cornelius Moras.  Hypertension:  Yes  Smoking:  No, quit in February 2008.  Previous Surgery:  No  CV History:  No  Amaurosis Fugax No, Paresthesias No, Hemiparesis No                                       RIGHT             LEFT  Brachial systolic pressure:         204               200  Brachial Doppler waveforms:         triphasic         triphasic  Vertebral direction of flow:        antegrade         not detected  DUPLEX VELOCITIES (cm/sec)  CCA peak systolic                   58                53  ECA peak systolic                   342               132  ICA peak systolic                   232               261  ICA end diastolic                   57                61  PLAQUE MORPHOLOGY:                  calcified with shadowing            calcified with shadowing  PLAQUE AMOUNT:                      large             large  PLAQUE LOCATION:                    CCA, ICA, ECA     CCA, ICA, ECA   IMPRESSION:  1. Bilateral external carotid artery stenoses, right greater than      left.  2. Bilateral 60-79% internal carotid artery stenoses, however,      extensive calcified plaque may obscure a more severe stenosis.  3. Left vertebral artery not identified possible occlusion.   ___________________________________________  Janetta Hora Darrick Penna, MD   DP/MEDQ  D:  08/09/2006  T:  08/09/2006  Job:  [id number]

## 2010-05-30 NOTE — Assessment & Plan Note (Signed)
OFFICE VISIT   [name], [name]  DOB:  [birth year]                                       03/17/2008  CHART#:[id number]   The patient returns for a routine follow-up visit today.  She is a 77-  year-old female who previously underwent left carotid endarterectomy in  August of 2009.  We have been following her for a moderate right  internal carotid artery stenosis which has been asymptomatic.  She  continues to deny any symptoms of TIA, amaurosis or stroke.  She has  continued to refrain from smoking.  She continues to take 1 aspirin  daily.  Her atherosclerotic risk factors remain hypertension, elevated  cholesterol.   PAST SURGICAL HISTORY:  She had mitral valve repair, hysterectomy.   FAMILY HISTORY:  Remarkable for her mother who had vascular disease at a  young age.   SOCIAL HISTORY:  She has 2 children.  She is retired.  She does not  consume alcohol regularly.  Tobacco history is as above.   MEDICATIONS:  1. Metoprolol 50 mg once a day.  2. Klor-Con M 20 once a day.  3. Furosemide 40 mg once a day.  4. Aspirin 81 mg once a day.  5. Enalapril 20 mg once a day.  6. Lipitor 20 mg once a day.   She has no known drug allergies.   PHYSICAL EXAMINATION:  Vital Signs:  Blood pressure is 198/100 today.  She states that it was 140 mmHg at home today before coming to the  office.  Pulse is 62 and regular.  Temperature is 98.1.  HEENT:  Unremarkable.  Neck:  Has a right and left carotid bruit, the right  bruit is much louder and harsher than the left.  She has 2+ carotid  pulses.  Chest:  Clear to auscultation bilaterally.  Cardiac:  Regular  rate and rhythm without murmur.  Abdomen:  Soft, nontender, nondistended  with no masses.  Extremities:  She has a 1+ dorsalis pedis pulse on the  left side.  She has absent pedal pulses on the right side.  She has 2+  femoral pulses bilaterally.  Neurologic:  Shows symmetric upper  extremity and lower extremity motor  strength.  Tongue is midline.  Swallow is intact.   She had a carotid duplex exam today which shows no significant  restenosis of the left internal carotid artery.  However, the right  internal carotid artery stenosis has progressed and is now greater than  80%.  Peak systolic velocity on the right side was 515 cm/s with an end-  diastolic velocity of 158 cm/s.   In summary, the patient has now developed a high-grade stenosis of the  right internal carotid artery.  I believe she would benefit from right  carotid endarterectomy for stroke prophylaxis.  Risks, benefits,  possible complications were explained to the patient today including  bleeding, infection, stroke, cranial nerve injury.  She understands and  agrees to proceed.  Right carotid endarterectomy is scheduled for April 05, 2008.   Janetta Hora. Fields, MD  Electronically Signed   CEF/MEDQ  D:  03/17/2008  T:  03/18/2008  Job:  [id number]   cc:   Nani Gasser, M.D.  [name] Jens Som, MD, [facility]

## 2010-05-30 NOTE — Discharge Summary (Signed)
NAMESayler, [name]                  ACCOUNT NO.:  [id number]   MEDICAL RECORD NO.:  [id number]          PATIENT TYPE:  INP   LOCATION:  3304                         FACILITY:  [facility]   PHYSICIAN:  Janetta Hora. Fields, MD  DATE OF BIRTH:  [birth year]   DATE OF ADMISSION:  09/04/2007  DATE OF DISCHARGE:  09/05/2007                               DISCHARGE SUMMARY   DISCHARGE DIAGNOSES:  1. Left carotid occlusive disease.  2. Hypertension.  3. Dyslipidemia.  4. Status post mitral valve repair.   PROCEDURE PERFORMED:  Left carotid endarterectomy with a Dacron patch  angioplasty closure on 09/04/2007 by Dr. Darrick Penna.   COMPLICATIONS:  None.   DISCHARGE MEDICATIONS:  She is given a prescription for Percocet 5/325  one p.o. q.4 h. p.r.n. pain.  She is instructed to continue her  preadmission medications consisting of metoprolol ER 25 mg p.o. daily,  potassium supplementation 20 mEq p.o. daily, Lasix 40 mg p.o. daily,  baby aspirin p.o. daily, enalapril 20 mg p.o. nightly, and Lipitor 20 mg  p.o. nightly.   CONDITION AT DISCHARGE:  Stable, improving.   DISPOSITION:  She is being discharged home in a stable condition with  her wounds healing well.  She is instructed to observe the wound for  signs of infection.  She is instructed to increase her activity slowly.  She may walk.  She may shower starting tomorrow, 09/06/2007.  She should  not drive for the next 2-3 weeks.  She is given an appointment to see  Dr. Darrick Penna in 2 weeks.  The office will arrange a visit.   BRIEF IDENTIFYING STATEMENT:  For complete details, please refer the  typed history and physical.  Briefly, this very pleasant 77 year old  woman was referred to Dr. Darrick Penna for carotid occlusive disease.  She was  found to have a very tight left carotid narrowing.  Dr. Darrick Penna felt that  she should undergo left carotid endarterectomy.  She was informed of the  risks and benefits of the procedure and after careful consideration  elected to proceed with surgery.   HOSPITAL COURSE:  Preoperative workup was completed as an outpatient.  She was brought in through same-day surgery and underwent the  aforementioned left carotid endarterectomy.  For complete details,  please refer the typed operative report.  The procedure was without  complications.  She was returned to the Post Anesthesia Care Unit and  extubated.  Following stabilization, she was transferred to a bed on a  surgical step-down unit.  The following  morning, she was neurologically intact.  She was walking.  Her  swallowing mechanism was intact.  Her tongue was midline and  neurologically she was nonfocal.  She was desirous of discharge and was  discharged home in stable condition.      Wilmon Arms, PA      Janetta Hora. Fields, MD  Electronically Signed    KEL/MEDQ  D:  09/05/2007  T:  09/05/2007  Job:  [id number]

## 2010-05-30 NOTE — Assessment & Plan Note (Signed)
OFFICE VISIT   [name], [name]  DOB:  11-Aug-1932                                        January 20, 2007  CHART #:  [id number]   HISTORY OF PRESENT ILLNESS:  The patient returns for a routine late  followup now more than six months following mitral valve repair and  coronary artery bypass grafting x2. She was last seen in the office on  July 08, 2006. Since then, she has done remarkably well. She continues  to be followed carefully by Dr. Jens Som, and she has now been taken off  of Coumadin. She is getting along quite well. She reports that her  exercise tolerance is excellent, much better than it had been for quite  some time prior to her surgery. She has had no shortness of breath. She  has no physical restrictions. She is getting along very well and she has  no complaints.   PAST MEDICAL HISTORY:  Otherwise unchanged.   CURRENT MEDICATIONS:  1. Toprol XL 25 mg daily.  2. Lasix 40 mg daily.  3. Potassium chloride 20 mEq daily.  4. Aspirin 81 mg daily.  5. Enalapril 20 mg daily.  6. Lipitor 40 mg daily.   PHYSICAL EXAMINATION:  Is notable for a well-appearing female with blood  pressure of 140/80, pulse 72. Oxygen saturation is 99% on room air.  Examination of the chest reveals a median sternotomy scar that is healed  completely. The sternum is stable on palpation. Breath sounds are clear  to auscultation and symmetrical bilaterally.  CARDIOVASCULAR: Is notable for regular rate and rhythm. No murmurs, rubs  or gallops are noted.  ABDOMEN: Soft and nontender.  EXTREMITIES: Are warm and well perfused. There is no lower extremity  edema.   IMPRESSION:  Excellent progress now more than six months following  mitral valve repair and coronary artery bypass grafting x2. Earlier  following her surgery there had been some question regarding a  possibility of a mass or thrombus in the right atrium of unclear  significance. The patient tells me that she is  scheduled for a followup  echocardiogram some time in February. Presuming this remains normal with  no signs of delayed problems or residual thrombus, I suspect that she  will continue to do well indefinitely.   PLAN:  The patient will call and return to see [place] as needed in the  future. We will look forward to seeing the results of her echocardiogram  in February. All of her questions have been addressed.   Salvatore Decent. Cornelius Moras, M.D.  Electronically Signed   CHO/MEDQ  D:  01/20/2007  T:  01/20/2007  Job:  [id number]   cc:   [name]. Jens Som, MD, [facility]  Nani Gasser, M.D.

## 2010-05-30 NOTE — Discharge Summary (Signed)
NAMECANDI, PROFIT                  ACCOUNT NO.:  [id number]   MEDICAL RECORD NO.:  [id number]          PATIENT TYPE:  INP   LOCATION:  2008                         FACILITY:  [facility]   PHYSICIAN:  Theda Belfast, PA DATE OF BIRTH:  [birth year]   DATE OF ADMISSION:  05/22/2006  DATE OF DISCHARGE:  05/30/2006                               DISCHARGE SUMMARY   ADDENDUM:  The patient was tentatively ready for discharge home May 28, 2006.  Discharge __________, the patient still requiring oxygen, with  volume overload.  She was continued on diuretics and encouraged  incentive spirometer.  A chest x-ray obtained, which revealed persistent  moderate-sized right pleural effusion.  Thoracentesis was performed by  Dr. Cornelius Moras on May 29, 2006, and removed 700 mL of serosanguineous fluid.  Followup chest x-ray post-thoracentesis showed no pneumothorax and  improved right pleural effusion.  By May 15, postop day 8, the patient  is able to be weaned off oxygen, with sats greater than 90% on room air  while resting.  With ambulation, the patient's was noted to have dropped  in the 80s on room air.  It was felt that the patient would require home  oxygenation for some time with ambulation post discharge.  Case  management was arranged, and this was set up.  The patient's vital signs  remained stable.  She remained afebrile.  Her incisions remained clean,  dry and intact and healing well.  Pulmonary status had improved post  thoracentesis.  She remained in normal sinus rhythm.  The patient was  discharged to home May 30, 2006, postop day 8 on home oxygen.   Please see dictated discharge summary for followup appointments and  discharge instructions.   DISCHARGE MEDICATIONS:  1. Aspirin 81 mg daily.  2. Toprol-XL 25 mg daily.  3. Lipitor 20 mg a day.  4. Amiodarone 200 mg daily.  5. Coumadin 2 mg at night, this is to be followed by her Coumadin      clinic.  6. Lasix 40 mg daily.  7. Potassium  chloride 20 mEq daily.  8. Enalapril 20 mg daily.  9. __________ mg b.i.d.  10.Ultram 50 mg 1-2 tabs q.4-6 hours p.r.n. pain.      Theda Belfast, PA     KMD/MEDQ  D:  06/20/2006  T:  06/20/2006  Job:  [id number]   cc:   Veverly Fells. Excell Seltzer, MD

## 2010-05-30 NOTE — Op Note (Signed)
NAMEJailene, [name]                  ACCOUNT NO.:  [id number]   MEDICAL RECORD NO.:  [id number]          PATIENT TYPE:  INP   LOCATION:  3304                         FACILITY:  [facility]   PHYSICIAN:  Melissa Hora. Fields, MD  DATE OF BIRTH:  September 04, 1932   DATE OF PROCEDURE:  09/04/2007  DATE OF DISCHARGE:                               OPERATIVE REPORT   PROCEDURE:  Left carotid endarterectomy.   PREOPERATIVE DIAGNOSIS:  High-grade asymptomatic left internal carotid  artery stenosis.   POSTOPERATIVE DIAGNOSIS:  High-grade asymptomatic left internal carotid  artery stenosis.   ANESTHESIA:  General.   ASSISTANT:  Wilmon Arms, PA   INDICATIONS:  The patient is a 77 year old female who has a high-grade  greater than 80% left internal carotid artery stenosis.   OPERATIVE FINDINGS:  1. Greater than 80% left internal carotid artery stenosis.  2. A 10-French shunt.  3. Dacron patch.   OPERATIVE DETAILS:  After obtaining informed consent, the patient was  taken to the operating room.  The patient was placed in supine position  in operating table.  After induction of general anesthesia by  endotracheal intubation, a Foley catheter was placed.  Next, the  patient's entire left neck and chest were prepped and draped in usual  sterile fashion.  An oblique incision was made along the left side of  the neck, carried down through subcutaneous tissues to the platysma, and  along the anterior border of the left sternocleidomastoid muscle.  This  was then reflected laterally.  Common facial vein was dissected free  circumferentially and ligated and divided between silk ties.  Common  carotid artery was identified, dissected free circumferentially, and  felt posteriorly.  The artery was slightly thickened.  A preoperative  duplex suggested there was 50% stenosis of the common carotid artery  just below the bifurcation.  Therefore, dissection was carried down to  the level of the omohyoid muscle.   This was divided.  The vagus nerve  was identified and protected.  Common carotid artery was dissected free  at the base of neck and an umbilical tape placed around this.  Next,  dissection proceeded up to the level of the carotid bifurcation.  The  superior thyroid artery was dissected free circumferentially and this  was found to be coming off the common carotid artery.  It was dissected  free circumferentially and ligated and divided between silk ties.  The  external carotid artery was dissected free circumferentially and a blue  vessel loop placed around this.  Next, the internal carotid artery was  dissected free circumferentially.  There was large amount of posterior  plaque and this extended up to the level of the hypoglossal nerve and  not below it.  The nerve was protected.  The ansa cervicalis was divided  in order to mobilize the nerve to prepare the distal internal carotid  artery for clamping.  A loop was also placed around the distal internal  carotid artery.  The patient was then given 7000 units of intravenous  heparin.  The distal internal carotid artery was controlled  with a fine  bulldog clamp.  The external carotid artery was controlled with a vessel  loop.  The common carotid artery was controlled with peripheral DeBakey  clamp.  A longitudinal opening was made just below the carotid  bifurcation using 11 blade.  The arteriotomy was extended up through the  carotid bifurcation.  There was a high-grade stenosis greater than 80%  in this location.  The stenosis was calcified and quite granular in  appearance.  The arteriotomy was extended up distally into the internal  carotid artery past the level of stenosis.  The arteriotomy was then  extended proximally down to a more suitable segment of common carotid  artery.  There were still some thickening at this portion of the artery,  but there was no significant flow-limiting stenosis.  Next, a 10-French  shunt was brought  up in the operative field and threaded through the  distal internal carotid artery and allowed to back bleed thoroughly.  It  was then placed down to the proximal common carotid artery and secured  with a Rumel tourniquet.  Shunt was then inspected and found to be free  of air and opened with restoration of flow back to the brain after  approximately 5 minutes.  Next, an endarterectomy was begun in the  suitable plane near the carotid bifurcation.  A good distal endpoint was  obtained in the internal carotid artery.  The external carotid artery  was endarterectomized by eversion technique.  A good endpoint was also  obtained in the proximal common carotid artery.  Plaque was removed and  passed off the table as specimen.  Then, all loose debris was removed  from the carotid artery under direct vision.  This was then thoroughly  irrigated with heparinized saline.  A patch angioplasty was then  performed using a Dacron patch.  This was done with a running 6-0  Prolene suture.  Just prior to completion of the anastomosis, the shunt  was reoccluded with a hemostat.  The distal internal carotid artery was  controlled with a fine bulldog clamp and the shunt removed from the  distal internal carotid artery.  This was allowed to back bleed  thoroughly.  The shunt was then removed from the proximal common carotid  artery and this was again controlled with peripheral DeBakey clamp.  Artery was thoroughly irrigated with heparinized saline.  The external  carotid artery was also thoroughly back bled.  Remainder of the patch  was completed.  After completing the anastomosis, the distal internal  carotid artery was controlled with digital pressure.  Flow was then  first restored up to the external carotid artery and after approximately  5 cardiac cycles to the internal carotid artery.  Doppler was used to  inspect the artery.  There was a good flow in the internal, external,  and common carotid arteries  by Doppler exam.  Next, hemostasis was  obtained with thrombin and Gelfoam.  The patient was also given 50 mg of  protamine.  Platysmal muscle was reapproximated using running 3-0 Vicryl  suture.  Skin was closed with 4-0 Vicryl subcuticular stitch.  The  patient tolerated the procedure well and there were no complications.  Instruments, sponge, and needle counts were correct at the end of the  case.  The patient was taken to the recovery room in stable condition.  The patient was neurologically intact, moving upper extremity and lower  extremities symmetrically at the end of the case.  Melissa Hora. Fields, MD  Electronically Signed     CEF/MEDQ  D:  09/04/2007  T:  09/05/2007  Job:  [id number]

## 2010-05-30 NOTE — Procedures (Signed)
CAROTID DUPLEX EXAM   INDICATION:  Follow-up evaluation of known carotid artery disease.   HISTORY:  Diabetes:  No.  Cardiac:  Coronary artery bypass graft in 07/08 by Dr. Cornelius Moras.  Hypertension:  Yes.  Smoking:  No.  Previous Surgery:  Left carotid endarterectomy, 09/04/07.  CV History:  Patient has a known right common and internal carotid  artery stenosis.  Patient is asymptomatic from a cerebrovascular  standpoint.  Amaurosis Fugax No, Paresthesias No, Hemiparesis No.                                       RIGHT             LEFT  Brachial systolic pressure:         220               210  Brachial Doppler waveforms:         Triphasic         Triphasic  Vertebral direction of flow:        Antegrade         Inaudible  DUPLEX VELOCITIES (cm/sec)  CCA peak systolic                   32                79  ECA peak systolic                   113               134  ICA peak systolic                   515               90  ICA end diastolic                   158               31  PLAQUE MORPHOLOGY:                  Calcified         Calcified  PLAQUE AMOUNT:                      Severe            Mild-to-moderate  PLAQUE LOCATION:                    Proximal ICA, ECA CCA at proximal  Dacron patch   IMPRESSION:  1. 80-99% right internal carotid artery stenosis.  2. No left internal carotid artery stenosis, status post      endarterectomy.  3. 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       ___________________________________________  Janetta Hora Fields, MD   MC/MEDQ  D:  03/17/2008  T:  03/17/2008  Job:  [id number]

## 2010-05-30 NOTE — Op Note (Signed)
NAMESusette, [name]                  ACCOUNT NO.:  [id number]   MEDICAL RECORD NO.:  [id number]          PATIENT TYPE:  INP   LOCATION:  3305                         FACILITY:  [facility]   PHYSICIAN:  Melissa Hora. Fields, MD  DATE OF BIRTH:  Mar 01, 1932   DATE OF PROCEDURE:  04/05/2008  DATE OF DISCHARGE:                               OPERATIVE REPORT   PROCEDURE:  Right carotid endarterectomy.   PREOPERATIVE DIAGNOSIS:  High-grade right internal carotid artery  stenosis.   POSTOPERATIVE DIAGNOSIS:  High-grade right internal carotid artery  stenosis.   ANESTHESIA:  General.   ASSISTANT:  Jerold Coombe, PA-C   OPERATIVE FINDINGS:  1. Extensive common carotid and carotid bifurcation occlusive disease.  2. Right internal carotid artery stenosis greater than 80%.  3. Right common carotid artery stenosis greater than 50%.  4. Dacron patch approximately 10 cm in length.  5. A 10-French long shunt.   OPERATIVE DETAILS:  After obtaining informed consent, the patient was  taken to the operating room.  The patient was placed in supine position  on the operating room table.  After induction of general anesthesia and  endotracheal intubation, the patient's entire right neck and chest were  prepped and draped usual sterile fashion.  An oblique incision was made  on the right aspect of the neck just anterior to the right  sternocleidomastoid muscle.  Incision was carried down through  subcutaneous tissues into the platysma.  Sternocleidomastoid muscle was  identified and dissection was proceeded along its anterior border.  This  was reflected laterally.  The internal jugular vein was identified and  reflected laterally.  Common facial vein was dissected free  circumferentially and ligated divided between silk ties.  Common carotid  was dissected free at the base of the incision.  The artery was quite  thickened just below the level of the carotid bifurcation.  There is  also discrete  palpable plaque approximately 2 cm below the bifurcation.  The artery felt more normal at the level of the omohyoid muscle.  The  omohyoid muscle was taken down with electrocautery.  Vagus nerve was  identified and protected.  A umbilical tape was placed around the common  carotid artery just at the base of the omohyoid muscle.  Next,  dissection was carried up to the level of the carotid bifurcation.  Superior thyroid artery and external carotid arteries were dissected  free circumferentially.  The carotid artery was rotated over and almost  an AP configuration.  By rotating the superior thyroid and external  carotid arteries, I was able to freely dissect the internal carotid  artery at an area above the level of stenosis.  The hypoglossal nerve  was identified and protected.  The vessel loop was placed around the  distal internal carotid artery, external carotid, and superior thyroid  arteries.  At this point, the patient was given 7000 units of  intravenous heparin.  The patient was also given an additional 3000  units of heparin during the case.  The distal internal carotid arteries  controlled with a fine bulldog clamp.  Vessel loops were used to control  the external superior thyroid artery.  Common carotid artery was  controlled with peripheral DeBakey clamp.  Next, a longitudinal opening  was made in the common carotid artery approximately 3 cm below the  bifurcation.  This arteriotomy was then extended superiorly through the  carotid bifurcation.  There was a high-grade calcified stenosis in this  area greater than 80%.  Arteriotomy was extended up into the internal  carotid artery past the level of stenosis.  Next, a 10-French shunt was  brought up in the operative field.  This was a long shunt cut from a  pediatric feeding tube.  This was threaded into the distal internal  carotid artery and allowed to back bleed thoroughly.  Incision threaded  down to the common carotid artery.   Due to the distal extent of the  common carotid artery, there was some difficulty placing the shunt  initially.  There was also air in this device, so this was pulled back  out of the common carotid and allowed to back bleed thoroughly until all  air was removed.  This was then placed back down in the common carotid  and secured with a Rumel tourniquet.  At this point, flow was restored  to the brain as the shunt was reopened after approximately 10 minutes of  ischemia time.  Next, the common carotid artery was inspected and there  was found to still be a area of thickening plaque in the base of the  common carotid artery.  Therefore, the arteriotomy was extended more  proximal.  The proximal common carotid artery was freed up extensively  down to the level of the sternal notch.  Umbilical tape was then moved  down to this position and the Rumel tourniquet again secured for control  of the shunt.  There was some leaking around the shunt distally and this  was controlled with a vessel loop.  Next, endarterectomy was begun in a  suitable plane.  The external carotid artery was severely calcified.  This was endarterectomized by eversion technique and there was good  backbleeding.  A good distal endpoint was obtained in the distal  internal carotid artery.  At the proximal common carotid artery, there  was some slight step off, but the major areas of plaque were removed.  The area of 50% stenosis was removed.  There was probably a residual 25%  stenosis within the common carotid artery, but there did not seem to be  any palpable into this area of thickened plaque.  Next, all loose debris  was removed from the carotid artery.  This was thoroughly irrigated with  heparinized saline.  Next, a long Dacron patch was brought up in the  operative field.  The entire length of the patch was approximately 10 cm  in length after completion of the patch.  This was sewn as patch  angioplasty using running  6-0 Prolene suture.  Just prior completion of  the anastomosis, the shunt was reoccluded and brought down from the  distal internal carotid artery.  This was allowed to back bleed  thoroughly.  This was then clamped again with a fine bulldog clamp.  The  shunt was then removed from proximal common carotid artery and this fore  bled thoroughly.  Everything was thoroughly irrigated with heparinized  saline.  The common carotid artery was again controlled with a  peripheral DeBakey clamp.  The external carotid artery was thoroughly  back bled and things  again thoroughly irrigated with heparinized saline.  The remainder of anastomosis was completed.  After this was secured, the  external carotid artery was opened and retrograde flow was established  back down into the common carotid artery.  Peripheral DeBakey clamp was  then removed from the common carotid artery and flow was first restored  up the external carotid artery and after approximately 5 cardiac cycles,  flow was restored up the internal carotid artery.  Doppler was used to  examine the artery and there was good flow in the internal, external,  and common carotid arteries.  Hemostasis was then obtained.  The patient  was also given a total of 100 mg of protamine.  A few repair sutures  were placed at the proximal end of the patch.  After hemostasis had been  obtained, platysma muscles were reapproximated running 3-0 Vicryl  suture.  Skin was closed with 4-0 Vicryl subcuticular stitch.  The  patient tolerated procedure well and there were no complications.  Instrument, sponge, and needle counts were correct at the end of the  case.  The patient awakened in the operating room and taken to recovery  room in stable condition with symmetric upper extremity and lower  extremity motor strength of 5/5.      Melissa Hora. Fields, MD  Electronically Signed     CEF/MEDQ  D:  04/05/2008  T:  04/06/2008  Job:  [id number]

## 2010-05-30 NOTE — H&P (Signed)
[name], [name]                  ACCOUNT NO.:  [id number]   MEDICAL RECORD NO.:  [id number]         PATIENT TYPE:  CINP   LOCATION:                               FACILITY:  [facility]   PHYSICIAN:  Charles E. Fields, MD  DATE OF BIRTH:  [birth year]   DATE OF ADMISSION:  09/04/2007  DATE OF DISCHARGE:                              HISTORY & PHYSICAL   REASON FOR ADMISSION:  Asymptomatic left internal carotid artery  stenosis.   HISTORY OF PRESENT ILLNESS:  The patient is a 77 year old female who we  have been following since January 2008 when she was noticed to have  bilateral carotid bruits.  At that time, she had bilateral 60%-80%  carotid stenoses.  These were asymptomatic.  Her primary atherosclerotic  risk factors included hypertension, elevated cholesterol, and cigarette  smoking.  She was smoking 3-4 cigarettes at that time.  She was  subsequently followed with serial duplex exams.  She had an episode of  atrial fibrillation as late as July 2008 and was put on Coumadin at that  time.  She also had a mitral valve replacement by Dr. Cornelius Moras.  She  continued to have no symptoms from her carotid stenosis.  She was then  seen again for routine surveillance in July 2009.  At that time, she had  been taken off her Coumadin.  She still had no TIA or stroke symptoms.  She has also now been tobacco free for 1 year.  She is currently taking  one aspirin daily.  At the time of her last surveillance carotid duplex  exam; however, she was noted to have progression of her left internal  carotid artery stenosis, which was greater than 80%.  Peak systolic  velocity was 462 cm per second with an end-diastolic velocity of 132 cm  per second.  She was still in the 60%-80% range on the right side.  She  also had greater than 50% stenosis of the distal left common carotid  artery extending into the proximal ICA.  This was primarily near the  bifurcation.  She has a similar phenomenon of the right common  carotid  artery.   Her past surgical history is remarkable for hysterectomy and mitral  valve repair.   Her past medical history is otherwise remarkable for hypertension,  elevated cholesterol as mentioned above.   FAMILY HISTORY:  Remarkable for her mother who had vascular disease at a  young age.   SOCIAL HISTORY:  She has 2 children.  She is retired.  She is a former  cigarette smoker as listed above.  She does not consume alcohol  regularly.   On review of systems, she is 5 feet, 120 pounds.  She has no history of  COPD, asthma, GI bleeding, renal insufficiency, syncope, headaches,  anxiety, depression, or problems with clotting.  She denies any  claudication symptoms.   Her medications include metoprolol 25 mg once a day, Klor-Con 20 mEq  once a day, furosemide 40 mg once a day, aspirin 81 mg once a day,  enalapril 20 mg once a day, and  Lipitor 40 mg once a day.   PHYSICAL EXAMINATION:  VITAL SIGNS:  Blood pressure is 130/82 in the  left arm, 165/97 in the right arm, and pulse 72 and regular.  HEENT:  Unremarkable.  NECK:  She has bilateral carotid bruits, right greater than the left.  She has 2+ carotid pulses.  CHEST: Clear to auscultation bilaterally.  CARDIAC:  Regular rate and rhythm without murmur.  ABDOMEN:  Soft, nontender, and nondistended with no mass.  EXTREMITIES:  She has 2+ radial pulses bilaterally.  She has 2+ femoral  pulses bilaterally.   In summary, [name] has not developed a high-grade left internal  carotid artery stenosis, which is asymptomatic.  She also has a moderate  right internal carotid artery stenosis.  I believe that she would  benefit from left carotid endarterectomy for stroke prophylaxis.  She is  scheduled for this procedure on September 04, 2007.  Risks, benefits, and  possible complications including but not limited to bleeding, infection,  stroke risk of 1%-2%, cranial nerve injury of 5%-10% were explained to  the patient.  She  understands and agrees to proceed.      Janetta Hora. Fields, MD  Electronically Signed     CEF/MEDQ  D:  09/04/2007  T:  09/04/2007  Job:  [id number]

## 2010-05-30 NOTE — Assessment & Plan Note (Signed)
OFFICE VISIT   [name], [name]  DOB:  [birth year]                                       08/09/2006  CHART#:[id number]   [name] returns for followup today for asymptomatic carotid stenosis.  She was last seen in January of 2008.  Overall, she has been doing well.  She continues to remain asymptomatic.  She specifically denies any  symptoms of TIA, stroke or amaurosis.  She is currently taking warfarin  for recent atrial fibrillation and some atrial thrombus after mitral  valve replacement.   PHYSICAL EXAM:  Blood pressure is 194/96, pulse is 71 and regular.  HEENT:  She has bilateral carotid bruits.  She has 2+ carotid, radial  and femoral pulses.  She has absent pedal pulses bilaterally.  Neurologic Exam:  Shows symmetric upper extremity and lower extremity  motor strength.  She had a repeat carotid Duplex exam today which showed  bilateral 60-80% stenosis with some calcification, velocities were  slightly decreased compared to that from [place] in January of 2008.   She is currently taking Coumadin and aspirin.  I informed her that if  her Coumadin is stopped she should certainly continue her aspirin long  term.  I believe as long as her carotid stenoses remain in the moderate  range and are not symptomatic we should continue to manage them with  risk factor modification.  She will return in 6 months for repeat  carotid Duplex exam or sooner if her carotid stenosis becomes  symptomatic.   Janetta Hora. Fields, MD  Electronically Signed   CEF/MEDQ  D:  08/09/2006  T:  08/12/2006  Job:  [id number]   cc:   Nani Gasser, M.D.  [name] Jens Som, MD, [facility]

## 2010-05-30 NOTE — Assessment & Plan Note (Signed)
OFFICE VISIT   Dorin, Lei B  DOB:  April 22, 1932                                       07/30/2007  CHART#:[id number]   The patient returns for routine followup for asymptomatic carotid  stenosis.  She was last seen in July 2008.  She continues to deny any  symptoms of TIA, amaurosis or stroke.  She is now off of her Coumadin.  She continues to take 1 aspirin daily.  I congratulated her today on  being tobacco free for 1 year.  __________ cholesterol.   PAST SURGICAL HISTORY:  Remarkable for hysterectomy.  She underwent  mitral valve repair by Dr. Cornelius Moras last year and did well from this.   PHYSICAL EXAM:  Today blood pressure is 196/96 in the left arm, 178/91  in the right arm, pulse is 78 and regular.  HEENT is unremarkable.  Neck:  She has bilateral soft carotid bruits.  Chest:  Clear to  auscultation.  Cardiac exam is regular rate and rhythm.  Abdomen is  soft, nontender, nondistended.  No masses.  She has 2+ radial, 1+  femoral pulses bilaterally.  She has absent popliteal and pedal pulses  bilaterally.  Of note, she did describe some claudication type symptoms,  but these occur at approximately 1/4 mile and are not bothersome to her.   She had a repeat carotid duplex exam today which shows that the left  carotid stenosis has now progressed into the 80-99% category.  Peak  systolic velocity was 462 cm/sec with an end-diastolic velocity of 132  cm/sec.  Stenosis on the right side was 60-70%.   MEDICATIONS:  Currently include metoprolol 25 mg once a day, Klor-Con 20  mEq once a day, furosemide 40 mg once a day, aspirin 81 mg once a day,  enalapril 20 mg once a day, Lipitor 40 mg once a day.   The patient's carotid stenosis is now progressed and I believe that she  would benefit from left carotid endarterectomy.  She wanted to wait a  few weeks before proceeding.  We have scheduled this for her on August  the 20th, 2009.  She will return for a preoperative  history and physical  on August the 19th, 2009.  Procedure details, risks, benefits, and  possible complications were explained to the patient today including but  not limited to bleeding, infection, stroke risk of 1% to 2%, cranial  nerve injury of approximately 5-10%   Charles E. Fields, MD  Electronically Signed   CEF/MEDQ  D:  07/30/2007  T:  07/31/2007  Job:  [id number]   cc:   [name]. Jens Som, MD, [facility]  Nani Gasser, M.D.

## 2010-06-02 NOTE — Assessment & Plan Note (Signed)
[facility]                            CARDIOLOGY OFFICE NOTE   NAME:[name], [name]                         MRN:          [id number]  DATE:01/25/2006                            DOB:          1932/07/05    [name] is a very pleasant female that I saw yesterday in  [place] for severe mitral regurgitation.  Please refer to my note  for details.  At that time, we scheduled her to have carotid Dopplers  due to bruits.  These were performed today in the office and they reveal  severe calcific plaque in the carotids system bilaterally with 80% to  99% stenosis bilaterally.  She has not had any neurological symptoms.  We will plan to refer her to our Vascular Surgeons for consideration of  carotid endarterectomy as indicated.  I will also schedule her to have a  Myoview for risk stratification prior to her surgery.  Once we have  completed that evaluation and a possible surgical procedure, then we  will plan to address her mitral valve which may also require mitral  valve repair.     [name] Jens Som, MD, [facility]  Electronically Signed    BSC/MedQ  DD: 01/25/2006  DT: 01/25/2006  Job #: [id number]   cc:   Nani Gasser, M.D.

## 2010-06-02 NOTE — Assessment & Plan Note (Signed)
[facility]                            CARDIOLOGY OFFICE NOTE   NAME:[name], [name]                         MRN:          [id number]  DATE:02/20/2006                            DOB:          Nov 05, 1932    [name] returns for followup today.  Please refer to my previous  notes for details.  Since I last saw her, she is doing well.  There is  no dyspnea on exertion, orthopnea, PND, pedal edema, palpitations,  presyncope, syncope or exertional chest pain.  Note we did perform  carotid Dopplers due to bruits and this was performed on January 25, 2006.  She had severe calcific plaque in the carotid system bilaterally.  There was a 60-79% stenosis bilaterally.  She was seen by Dr. Darrick Penna of  vascular surgery and followup carotid Dopplers are planned.  She also  had her transesophageal echocardiogram.  This revealed normal LV  function.  She had prolapse of the posterior mitral valve leaflet with  severe mitral regurgitation.  There was mild to moderate left atrial  enlargement.   MEDICATIONS INCLUDE:  1. Toprol 100 mg p.o. daily.  2. Zocor 40 mg p.o. daily.  3. Aspirin 81 mg p.o. daily.  4. Enalapril/hydrochlorothiazide 20/25 mg tablets p.o. daily.   PHYSICAL EXAMINATION:  Her physical exam today shows a blood pressure of  161/90 and a pulse of 68.  She weighs 120 pounds.  NECK:  Her neck is supple.  CHEST:  Clear.  CARDIOVASCULAR EXAM:  Reveals a regular rate and rhythm.  There is a 3-  4/6 systolic murmur at the apex that radiates throughout the precordium.  EXTREMITIES:  Show no edema.   DIAGNOSES:  1. Mitral valve prolapse with severe mitral regurgitation.  2. Cerebrovascular disease.  3. Hypertension.  4. Hyperlipidemia.  5. Tobacco abuse.   PLAN:  [name] returns for followup today.  I had long discussions  with her and her husband today about the options for treating her mitral  valve prolapse/mitral regurgitation.  Our options  would include  continued watchful waiting with serial echocardiograms.  We could  proceed with mitral valve repair/replacement when she develops any  symptoms of dyspnea/pedal edema, left ventricular enlargement or  decreased LV function on echocardiogram.  Alternatively, we could  consider early repair, particularly in light of the fact that this is  posterior mitral valve leaflet prolapse.  This would avoid the potential  complications of progressive left atrial enlargement/atrial fibrillation  or missing the window for reduced LV function.  I have recommended the  latter, given that this is an easily repairable valve.  She will think  about this and discuss it with her family and make a final decision when  I see her back in four weeks.  Note:  Her blood pressure is elevated  today and we will increase her enalapril to 20 mg p.o. b.i.d.  I will  check a BMET in one week to follow her potassium and renal function.  She will continue to see Dr. Darrick Penna concerning her cerebrovascular  disease.  We  will need to have her most recent lipids and liver  forwarded to [place] from Dr. Shelah Lewandowsky office and our goal LDL should be  less than 70, given her history of cerebrovascular disease.     [name] Jens Som, MD, [facility]  Electronically Signed    BSC/MedQ  DD: 02/20/2006  DT: 02/20/2006  Job #: [id number]   cc:   Nani Gasser, M.D.

## 2010-06-02 NOTE — Assessment & Plan Note (Signed)
[facility]                            CARDIOLOGY OFFICE NOTE   NAME:[name], [name]                         MRN:          [id number]  DATE:05/01/2006                            DOB:          [birth year]    [name] returns for followup today.  She has a history of severe  mitral regurgitation secondary to posterior mitral valve prolapse.  She  also has cerebrovascular disease.  Since I last saw her, she denies any  dyspnea, chest pain, palpitations, or syncope.  She occasionally has  mild pedal edema.   MEDICATIONS AT PRESENT:  1. Metoprolol ER 100 mg p.o. daily.  2. Zocor 40 mg p.o. daily.  3. Aspirin 81 mg p.o. daily.  4. Enalapril hydrochlorothiazide 20/25 mg tablets 2 p.o. daily.   PHYSICAL EXAM:  Blood pressure 149/77, pulse 61.  NECK:  Supple.  CHEST:  Clear.  CARDIOVASCULAR:  Regular rate and rhythm.  There is a 3/6 to 4/6  systolic murmur at the apex.  EXTREMITIES:  No edema.   DIAGNOSES:  1. Mitral valve prolapse with severe mitral regurgitation - Mrs.      [name] and I again discussed the options of treating her mitral      valve prolapse today.  The options would include continued watchful      waiting with followup echocardiograms and proceeding with mitral      valve repair if her left ventricle dilates or shows diminished      function.  We would also follow her symptoms and proceed with any      worsening dyspnea or congestive heart failure symptoms.      Alternatively, we could consider early repair, particularly in      light of the fact that there is posterior mitral valve prolapse.      This would avoid the potential complications of atrial fibrillation      in the future and developing left ventricular function that is      symptomatic.  She would like to discuss this with the surgeons.  We      will refer her to Dr. Cornelius Moras, who will contact her for an      appointment.  If she decides to proceed with early repair, we will      schedule her for a cardiac catheterization prior to then.  2. Cerebrovascular disease - This is being followed by Dr. Darrick Penna.  3. Hypertension - Her blood pressure is well-controlled.  4. Hyperlipidemia - Her most recent lipids showed an LDL greater than      100.  I will discontinue her Zocor.  We will begin Lipitor 80 mg      p.o. daily.  We will check lipids and liver in 6 weeks and adjust      as indicated.  Of note, we will check a BMET at that time, given      her diuretic use      as well.  5. History of tobacco use.  I will see her back in 3 months.     Arlys John  S. Jens Som, MD, [facility]  Electronically Signed    BSC/MedQ  DD: 05/01/2006  DT: 05/01/2006  Job #: [id number]   cc:   Nani Gasser, M.D.

## 2010-06-02 NOTE — Assessment & Plan Note (Signed)
[facility]                            CARDIOLOGY OFFICE NOTE   NAME:[name], [name]                         MRN:          [id number]  DATE:01/23/2006                            DOB:          Jun 26, 1932    [name] is a very pleasant 77 year old female with no prior cardiac  history, who I am asked to evaluate for a murmur and mitral  regurgitation. She typically does not have dyspnea on exertion,  orthopnea, PND, pedal edema, palpitations, pre syncope, syncope, or  chest pain. She was recently noted to have a murmur by Dr. Linford [name].  She was scheduled to have an echocardiogram which was performed on  December 12, 2005. Note, her left ventricular end-systolic dimension was  31 mm. Her left atrium measured 29 mm. Her overall left ventricular  function was normal. There was severe eccentric mitral regurgitation  with posterior leaflet prolapse. A flail segment could not be excluded.  The left atrium was interpreted as mildly dilated and there was mild  tricuspid regurgitation. Because of that we were asked to further  evaluate. Her medications include;  1. Enalapril HCT 10/25 mg tablets 1 p.o. daily.  2. Toprol 50 mg p.o. q. day.  3. Zocor 40 mg  p.o. q.h.s.  4. Baby aspirin.   SHE HAS NO KNOWN DRUG ALLERGIES.   SOCIAL HISTORY:  She does smoke and has for approximately 30 years. She  does not consume alcohol.   PAST MEDICAL HISTORY:  Significant for hypertension and hyperlipidemia,  but there is no diabetes mellitus. She has had a prior partial  hysterectomy but no other past medical history is noted.   FAMILY HISTORY:  Her mother had a myocardial infarction in her 55s.  There is also hypertension.   REVIEW OF SYSTEMS:  She denies any headaches, or fevers, or chills.  There is no productive cough or hemoptysis. There is no dysphagia,  odynophagia, melena, hematochezia. There is dysuria, hematuria. There is  no rash or seizure activity. There  is no orthopnea, PND, or pedal edema.  There is no claudication noted. The remaining systems are negative.   PHYSICAL EXAMINATION:  Today shows a weight of 120 pounds. Her blood  pressure is 163/86, and her pulse is 67. She is well-developed, well-  nourished, in no acute distress.  SKIN: Warm and dry.  She does not appear to be depressed and there is no peripheral clubbing.  Her back is normal.  HEENT: Unremarkable with normal eyelids.  NECK: Supple with a normal upstroke bilaterally. There are bilateral  carotid bruits possibly related to her murmur that radiates throughout  her precordium. There is no jugular venous distension, and I cannot  appreciate thyromegaly.  CHEST: Clear to auscultation, normal expansion.  CARDIOVASCULAR EXAM: Reveals a regular rate and rhythm. There is a 3 to  4/6 systolic murmur heard best at the apex; it radiates to the left  axial. Her PMI is nondisplaced. There is no right ventricular heave.  ABDOMINAL EXAM: Nontender, nondistended.  Positive bowel sounds, no  hepatosplenomegaly. No masses appreciated. There is no abdominal  bruit.  She has 2+ femoral pulses bilaterally, no bruits.  EXTREMITIES: Show no edema, and I could palpate no cords. She has 2 +  dorsalis pedal pulses bilaterally. There is no adenopathy palpated.  NEUROLOGIC EXAM: Grossly intact.   Her electrocardiogram shows a sinus rhythm with occasional PVCs. The  axis is normal. There are no significant ST changes noted.   DIAGNOSES:  1. Severe mitral regurgitation secondary to prolapse of the posterior      mitral valve leaflet.  2. Hypertension.  3. Hyperlipidemia.  4. Tobacco abuse.   PLAN:  [name] presents for evaluation of severe mitral  regurgitation secondary to prolapse of her mitral valve leaflet. She has  no symptoms including no shortness of breath, no chest pain, and no  palpitations. We will plan to proceed with a transesophageal  echocardiogram to more fully evaluate  the valve morphology and also  quantitate her mitral regurgitation. I will also schedule her to have  carotid Dopplers to exclude significant stenosis given her bruits,  although this certainly may be radiation of her murmur as it is heard  throughout the precordium. Her blood pressure is elevated today and we  will increase her enalapril HCT to 20/25 mg tablets and further increase  as needed based on her followup blood pressures. We will need to check a  BMET in 1 week to follow up potassium and renal function. I had long  discussions with [name] today concerning possible therapy for her  mitral valve. Our options would be to do followup serial  echocardiograms. If she then developed any symptoms, decreased left  ventricular function or left ventricular dilation, then mitral valve  surgery would be indicated. We could also consider early repair to avoid  the possibility of left ventricular dysfunction in the future as well as  atrial fibrillation. This would be particularly feasible if it is indeed  a repairable posterior mitral valve leaflet. We will make further  recommendations once we have the results of her transesophageal  echocardiogram. I will have her return in 4 weeks to discuss further our  plan of action. We will titrate her blood pressure medications at that  time as well.     [name] Melissa Som, MD, [facility]  Electronically Signed    BSC/MedQ  DD: 01/23/2006  DT: 01/23/2006  Job #: [id number]   cc:   [name], M.D.

## 2010-06-02 NOTE — Cardiovascular Report (Signed)
[name], [name]                  ACCOUNT NO.:  [id number]   MEDICAL RECORD NO.:  [id number]          PATIENT TYPE:  OIB   LOCATION:  1965                         FACILITY:  [facility]   PHYSICIAN:  Veverly Fells. Excell Seltzer, MD  DATE OF BIRTH:  19-Sep-1932   DATE OF PROCEDURE:  05/17/2006  DATE OF DISCHARGE:                            CARDIAC CATHETERIZATION   PROCEDURE:  Right heart catheterization, left heart catheterization,  selective coronary angiography, left ventricular angiography.   INDICATIONS:  [name] is a 77 year old woman who has mitral valve  prolapse with severe mitral regurgitation.  She has been followed by Dr.  Jens Som.  She was recently evaluated by Dr. Cornelius Moras who is planning mitral  valve repair.  She is here for preop right and left heart  catheterization with coronary angiography.   Risks and indications of procedure were explained to the patient.  Informed consent was obtained.  The right groin was prepped, draped and  anesthetized with 1% lidocaine.  Using the modified Seldinger technique,  a 4-French sheath was placed in the right femoral artery and a 7-French  sheath was placed in the right femoral vein, selective coronary  angiography was performed first.  A JL-5 catheter was used for the left  coronary artery and a 3DRC catheter was used for the right coronary  artery.  Following selective coronary angiography, an angled pigtail  catheter was inserted into the left ventricle and pressures were  recorded.  A left ventriculogram was performed and pullback across the  aortic valve was done.  At that point the Swan-Ganz catheter was  inserted and pressures were recorded throughout the right heart  chambers.  Oxygen saturations were drawn from the pulmonary artery as  well as the femoral artery.  Cardiac output was calculated via the Fick  method.  At the conclusion of the procedure, the sheaths were pulled and  manual pressure used for hemostasis.  All catheter  exchanges were  performed over a guidewire.  There were no complications.   FINDINGS:  Right atrial pressure mean 11.  Right ventricular pressure  64/16.  Pulmonary artery pressure 68/26 with a mean of 52.  Pulmonary  capillary wedge pressure mean is 37 with a large V-wave of approximately  50 mm.  Left ventricular pressure is 200/9 with an end-diastolic  pressure of 16.  Aortic pressure is 197/80 with a mean of 137.  There  was no aortic stenosis.   Oxygen saturation from the pulmonary artery 61%.  Aortic oxygen  saturation 92%.  Cardiac output via Fick is 2.4 liters per minute.   Left ventriculography performed in the 30 degree right anterior oblique  projection demonstrated normal left ventricular systolic function with  an LVEF estimated at 65%.  There was severe mitral regurgitation  demonstrated.   CORONARY ANGIOGRAPHY:  The left mainstem has ostial stenosis.  Angiographically it is difficult to appreciate the stenosis but there  was a large pressure gradient and the patient experienced chest pain and  ventricular ectopy with the 4-French catheter in place.  I think there  is at least a 50%  ostial left mainstem stenosis present.  The left  mainstem trifurcates into the LAD, intermediate branch and left  circumflex.  There is heavy calcification involving the left mainstem as  well as the LAD.  The LAD has a 25% proximal stenosis that courses down  and supplies the LV apex.  The remainder of the vessel has no  significant angiographic disease.  There are two small diagonal branches  present.   The intermediate branch has mild calcification but is otherwise  angiographically normal.  It is a large caliber ramus intermedius.   The left circumflex branch is large caliber.  There is proximal  calcification present.  There is no significant angiographic disease.  The circumflex supplies two posterolateral branches and the circumflex  system has no significant disease.   The  right coronary artery is mildly calcified.  There is a 25% proximal  stenosis present.  There is no obstructive disease.  There are two RV  marginal branches present.  Distally the right coronary artery  terminates in a posterior descending branch and posterior AV segment  that supplies one posterolateral.  There is no flow obstructive disease  seen throughout the right coronary artery.   ASSESSMENT:  1. Severe mitral regurgitation.  2. Significant left mainstem disease.  3. Pulmonary hypertension most likely attributable to mitral      regurgitation.   PLAN:  The patient is already been seen by Dr. Cornelius Moras.  She is scheduled  for surgery next week.  She will likely require mitral valve repair plus  coronary bypass surgery.      Veverly Fells. Excell Seltzer, MD  Electronically Signed     MDC/MEDQ  D:  05/17/2006  T:  05/17/2006  Job:  [id number]   cc:   Salvatore Decent. Cornelius Moras, M.D.  [name] Jens Som, MD, [facility]

## 2010-06-02 NOTE — Consult Note (Signed)
[name], [name]                  ACCOUNT NO.:  [id number]   MEDICAL RECORD NO.:  [id number]           PATIENT TYPE:   LOCATION:                                 FACILITY:   PHYSICIAN:  Clarence H. Cornelius Moras, M.D. DATE OF BIRTH:  [birth year]   DATE OF CONSULTATION:  05/07/2006  DATE OF DISCHARGE:                                 CONSULTATION   PRESENTING CHIEF COMPLAINT:  Mitral Regurgitation.   HISTORY OF PRESENT ILLNESS:  [name] is a 77 year old female from  [place], [place], with past medical history notable for  history of hypertension, hyperlipidemia, and asymptomatic  cerebrovascular disease.  The patient was recently noted to have a heart  murmur on physical exam, and she was referred by her primary care  physician, Dr. Nani Gasser, to Dr. Olga Millers.  The 2-D  echocardiogram was performed November28,2007.  Left ventricular function  was normal, but there was mitral valve prolapse with severe (4+) mitral  regurgitation.  She subsequently underwent transesophageal  echocardiogram on January15,2008.  This confirmed the presence of mitral  valve prolapse including a flail segment of the posterior leaflet of the  mitral valve.  There was severe mitral regurgitation.  There was also  moderate (2+ to 3+) tricuspid regurgitation.  Left ventricular function  was notably normal.  The effective regurgitant volume of the mitral  valve was estimated 24 mL with a regurgitant orifice area estimated 0.16  cm2 using the PISA technique.  After further consultation with Dr.  Jens Som, [name] has now been referred to consider elective mitral  valve repair.   REVIEW OF SYSTEMS:  GENERAL:  The patient reports normal appetite.  She  thinks that she may have gained 5 pounds or so since she quit smoking in  February.  She does admit to progressive exertional fatigue.  She  otherwise feels well, and she reports a very active physical lifestyle  for a patient her age.   CARDIAC:  The patient specifically denies any  symptoms of chest pain, chest tightness or chest pressure either with  activity or at rest.  The patient denies shortness of breath either with  activity or at rest.  The patient denies any PND, orthopnea,  palpitations, or syncope.  She does report intermittent mild bilateral  lower extremity edema.  RESPIRATORY:  Negative.  The patient denies  cough, hemoptysis, wheezing.  GASTROINTESTINAL:  Negative.  The patient  has no difficulty swallowing.  She denies hematemesis, hematochezia,  melena.  Her bowel function is regular.  GENITOURINARY:  Negative.  The  patient denies urinary urgency or frequency.  MUSCULOSKELETAL:  Negative.  The patient does report occasional heaviness involving her  left lower extremity extending from the hip all way to the ankle with  ambulation.  This only occurs if she pushes herself, and it is relieved  by rest.  She otherwise denies any aches or pains in either arm or leg,  and she specifically denies problems with arthritis.  NEUROLOGIC:  Notable for the absence of transient monocular blindness or transient  numbness or weakness involving either  upper or lower extremity.  HEENT:  Negative.  The patient has partial plate dentures, but otherwise no  dental problems.  She sees her dentist fairly regularly, although it is  been more than 6 months since her last visit.  She was not aware of the  need to receive dental prophylaxis for her abnormal heart valve.  PSYCHIATRIC:  Negative.  ENDOCRINE:  Negative.   PAST MEDICAL HISTORY:  1. Mitral regurgitation.  2. Hypertension.  3. Hyperlipidemia.  4. Cerebrovascular disease with asymptomatic bilateral internal      carotid artery stenosis, estimated 60-80% on both sides by duplex      exam.  5. Longstanding tobacco abuse, quit February2008   PAST SURGICAL HISTORY:  Partial hysterectomy in the distant past.   FAMILY HISTORY:  Noncontributory, although the patient's  mother died  with myocardial infarction and heart disease at a relatively young age.   CURRENT MEDICATIONS:  1. Metoprolol 50 mg daily,  2. Hydrochlorothiazide 25 mg daily.  3. Enalapril 20 mg daily.  4. Aspirin 81 mg daily.  5. Lipitor or 80 mg daily.   DRUG ALLERGIES:  None known.   PHYSICAL EXAMINATION:  GENERAL:  The patient is a well-appearing female  who appears her stated age in no acute distress.  VITAL SIGNS:  Blood pressure is 150/86, pulse 66, oxygen saturation 97%  on room air.  She is afebrile.  HEENT:  Exam is grossly unrevealing.  NECK:  She does have carotid bruits.  There is no jugular venous  distension.  CHEST:  Auscultation of the chest demonstrates clear breath sounds which  are symmetrical bilaterally.  No wheezes or rhonchi are noted.  CARDIOVASCULAR:  Exam is notable for a grade 4/6 holosystolic murmur  heard all across the precordium with radiation to the axilla.  No  diastolic murmurs are noted.  ABDOMEN:  The abdomen is soft, nontender.  The liver edge is not  palpable.  Bowel sounds are present.  There is no ascites.  EXTREMITIES:  Warm and adequately perfused.  There is trace bilateral  lower extremity edema.  There is mild venous insufficiency without  significant skin changes.  Distal pulses are not palpable in either  lower leg at the ankle.  RECTAL/GU:  Exams are both deferred.Marland Kitchen  NEUROLOGIC:  Examination is grossly nonfocal symmetrical throughout.   DIAGNOSTIC TESTS:  Transesophageal echocardiogram performed  January15,2008, by Dr. Olga Millers is reviewed.  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  The mitral valve  annulus appears to be significantly dilated as well.  There is severe  (4+) mitral regurgitation with an eccentric jet of mitral regurgitation. This extends around the atrial surface of the anterior leaflet.  There  is at least  moderate tricuspid regurgitation.  Left ventricular size and  function appear normal.  The aortic valve appears normal.  No other  significant abnormalities are noted.   IMPRESSION:  Mitral valve prolapse with severe mitral regurgitation  including flail segment of the posterior leaflet of mitral valve.  There  are already signs of significant tricuspid regurgitation.  Left  ventricular function appears normal.  The patient remains relatively  free of any significant symptoms at this point, although she does admit  to exertional fatigue and mild lower extremity edema.  I feel it would  be reasonable to consider early elective mitral valve repair, and the  findings of recent transesophageal echocardiogram suggest there  is a  high likelihood her valve will be repairable.  She may need tricuspid  annuloplasty as well.  She has many risk factors for coronary artery  disease, and she will obviously need cardiac catheterization prior to  surgery.  She is otherwise a good candidate for surgery, although she  does have a longstanding history of tobacco use as well as bilateral  internal carotid artery stenoses which remains asymptomatic at this  point in time.   PLAN:  I have discussed issues and options at length with [name]  here in the office today.  The relative risks and benefits of proceeding  with surgery at this juncture versus holding off and following her  carefully with close observation have been reviewed.  The relative risks  and benefits of surgery have been discussed, and expectations for  surgery have been reviewed.  She understands all potential associated  risks and desires to consider surgical intervention in the near future.  We will make arrangements for her to undergo elective left and right  heart catheterization through Dr. Ludwig Clarks office.  We will obtain  pulmonary function tests.  [name] will return to see me in the  office on Monday, May5, with tentative  plans to proceed with surgery on  Wednesday, May7.  All of their questions have been addressed.      Salvatore Decent. Cornelius Moras, M.D.  Electronically Signed     CHO/MEDQ  D:  05/07/2006  T:  05/07/2006  Job:  [id number]   cc:   [name]. Jens Som, MD, [facility]  Nani Gasser, M.D.

## 2010-07-30 ENCOUNTER — Other Ambulatory Visit: Payer: Self-pay | Admitting: Cardiology

## 2010-08-22 ENCOUNTER — Encounter: Payer: Self-pay | Admitting: Emergency Medicine

## 2010-08-22 ENCOUNTER — Inpatient Hospital Stay (INDEPENDENT_AMBULATORY_CARE_PROVIDER_SITE_OTHER)
Admission: RE | Admit: 2010-08-22 | Discharge: 2010-08-22 | Disposition: A | Discharge status: Home / Self Care | Payer: Medicare Other | Source: Ambulatory Visit | Attending: Emergency Medicine | Admitting: Emergency Medicine

## 2010-08-22 DIAGNOSIS — Z23 Encounter for immunization: Secondary | ICD-10-CM

## 2010-08-22 DIAGNOSIS — L03119 Cellulitis of unspecified part of limb: Secondary | ICD-10-CM

## 2010-08-22 DIAGNOSIS — L02519 Cutaneous abscess of unspecified hand: Secondary | ICD-10-CM | POA: Insufficient documentation

## 2010-08-24 ENCOUNTER — Telehealth (INDEPENDENT_AMBULATORY_CARE_PROVIDER_SITE_OTHER): Payer: Self-pay | Admitting: Emergency Medicine

## 2010-08-31 ENCOUNTER — Other Ambulatory Visit: Payer: Self-pay | Admitting: Cardiology

## 2010-08-31 DIAGNOSIS — I714 Abdominal aortic aneurysm, without rupture: Secondary | ICD-10-CM

## 2010-09-04 ENCOUNTER — Encounter (INDEPENDENT_AMBULATORY_CARE_PROVIDER_SITE_OTHER): Payer: Medicare Other | Admitting: Cardiology

## 2010-09-04 DIAGNOSIS — I714 Abdominal aortic aneurysm, without rupture: Secondary | ICD-10-CM

## 2010-09-04 DIAGNOSIS — I700 Atherosclerosis of aorta: Secondary | ICD-10-CM

## 2010-09-05 ENCOUNTER — Encounter: Payer: Self-pay | Admitting: Cardiology

## 2010-09-13 ENCOUNTER — Other Ambulatory Visit: Payer: Self-pay | Admitting: *Deleted

## 2010-09-13 MED ORDER — FUROSEMIDE 40 MG PO TABS: 40.0000 mg | ORAL_TABLET | Freq: Every day | ORAL | Status: DC

## 2010-09-20 ENCOUNTER — Other Ambulatory Visit: Payer: Self-pay | Admitting: *Deleted

## 2010-09-20 MED ORDER — ENALAPRIL MALEATE 20 MG PO TABS: 20.0000 mg | ORAL_TABLET | Freq: Two times a day (BID) | ORAL | Status: DC

## 2010-12-18 ENCOUNTER — Other Ambulatory Visit: Payer: Self-pay | Admitting: *Deleted

## 2010-12-18 MED ORDER — ENALAPRIL MALEATE 20 MG PO TABS: 20.0000 mg | ORAL_TABLET | Freq: Two times a day (BID) | ORAL | Status: DC

## 2010-12-18 NOTE — Progress Notes (Signed)
Summary: Cat Bite on right hand Room 5   Vital Signs:  Patient Profile:   77 Years Old Female CC:      Rt hand cat bite x 3 days, red and swollen no pain, cat has had all shots, belongs to her Height:     61 inches Weight:      146 pounds O2 Sat:      97 % O2 treatment:    Room Air Temp:     98.1 degrees F oral Pulse rate:   68 / minute Pulse rhythm:   regular Resp:     12 per minute BP sitting:   158 / 80  (left arm) Cuff size:   regular  Vitals Entered By: Emilio Math (August 22, 2010 2:13 PM)                  Current Allergies: No known allergies History of Present Illness Chief Complaint: Rt hand cat bite x 3 days, red and swollen no pain, cat has had all shots, belongs to her History of Present Illness: Her cat bit her hand 3 days ago after she pulled its tail by accident.  She was bit, not scratched.  Since then has had increased swelling and her hairdresser commented today that it looked bad. No drainage or pus.  No F/C.  She is not UTD on Td shot.  Her cat is UTD on shots.  Current Meds ASPIRIN 81 MG TBEC (ASPIRIN) Take 1 tablet by mouth once a day ENALAPRIL MALEATE 20 MG TABS (ENALAPRIL MALEATE) 1 tab by mouth two times a day TOPROL XL 100 MG TB24 (METOPROLOL SUCCINATE) Take 1 tablet by mouth once a day FUROSEMIDE 40 MG TABS (FUROSEMIDE) Take one tablet by mouth daily. POTASSIUM CHLORIDE CRYS CR 20 MEQ CR-TABS (POTASSIUM CHLORIDE CRYS CR) 1 tab by mouth once daily LIPITOR 80 MG TABS (ATORVASTATIN CALCIUM) Take one tablet by mouth daily. AMLODIPINE BESYLATE 5 MG TABS (AMLODIPINE BESYLATE) Take one tablet by mouth daily AUGMENTIN 875-125 MG TABS (AMOXICILLIN-POT CLAVULANATE) 1 by mouth two times a day for 10 days  REVIEW OF SYSTEMS Constitutional Symptoms      Denies fever, chills, night sweats, weight loss, weight gain, and fatigue.  Eyes       Denies change in vision, eye pain, eye discharge, glasses, contact lenses, and eye surgery. Ear/Nose/Throat/Mouth    Denies hearing loss/aids, change in hearing, ear pain, ear discharge, dizziness, frequent runny nose, frequent nose bleeds, sinus problems, sore throat, hoarseness, and tooth pain or bleeding.  Respiratory       Denies dry cough, productive cough, wheezing, shortness of breath, asthma, bronchitis, and emphysema/COPD.  Cardiovascular       Denies murmurs, chest pain, and tires easily with exhertion.    Gastrointestinal       Denies stomach pain, nausea/vomiting, diarrhea, constipation, blood in bowel movements, and indigestion. Genitourniary       Denies painful urination, kidney stones, and loss of urinary control. Neurological       Denies paralysis, seizures, and fainting/blackouts. Musculoskeletal       Complains of redness and swelling.      Denies muscle pain, joint pain, joint stiffness, decreased range of motion, muscle weakness, and gout.  Skin       Denies bruising, unusual mles/lumps or sores, and hair/skin or nail changes.  Psych       Denies mood changes, temper/anger issues, anxiety/stress, speech problems, depression, and sleep problems. Other Comments: Tetanus not-up-to-date  Past History:  Past Medical History: Reviewed history from 07/27/2009 and no changes required. CAROTID ARTERY STENOSIS  HYPERTENSION, BENIGN ESSENTIAL PVD  ATHEROSCLEROSIS, CORONARY, NATIVE ARTERY  HYPERCHOLESTEROLEMIA  RHINITIS, ALLERGIC NOS  Past Surgical History: Reviewed history from 07/26/2009 and no changes required. Mitral Valve repair (quadrangular resection of post leaflet w/ sliding laeflet annuloplaty and 24mm Edwards Physio ring)  05-22-06 CABG x 2 (Left int mammary artery to distal left ant descending CA, saphenous vein graft to ramus intermediate branch)  05-22-06 Right carotid endarterectomy  03/2008 Colonoscopy in [place] in 1996.    Family History: Reviewed history from 12/04/2005 and no changes required. Mother with MI Sister with sister.  Social History: Reviewed history  from 07/27/2009 and no changes required. 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   Tobacco Use - Former.  Physical Exam General appearance: well developed, well nourished, no acute distress MSE: oriented to time, place, and person R hand with dorsal puncture mark with hand swelling and erythema.  There is a streak up her R arm as well.  Mild TTP. No drainage.  No axillary LAD. Assessment New Problems: CELLULITIS, HAND (ICD-682.4)   Plan New Medications/Changes: AUGMENTIN 875-125 MG TABS (AMOXICILLIN-POT CLAVULANATE) 1 by mouth two times a day for 10 days  #20 x 0, 08/22/2010, Hoyt Koch MD  New Orders: Est. Patient Level IV [78295] Tdap => 33yrs IM [90715] Planning Comments:   Will place her on 10 days of Augmentin (should cover well for pasteurrella and staph) for the cat bite and give her a Td shot.  Encourage to ice, elevate and continue using hand.  If worsening, should seek further medical care in case different antibiotics required or referral to hand specialist. Nothing to culture today.  Will call pt in 2 days.   The patient and/or caregiver has been counseled thoroughly with regard to medications prescribed including dosage, schedule, interactions, rationale for use, and possible side effects and they verbalize understanding.  Diagnoses and expected course of recovery discussed and will return if not improved as expected or if the condition worsens. Patient and/or caregiver verbalized understanding.  Prescriptions: AUGMENTIN 875-125 MG TABS (AMOXICILLIN-POT CLAVULANATE) 1 by mouth two times a day for 10 days  #20 x 0   Entered and Authorized by:   Hoyt Koch MD   Signed by:   Hoyt Koch MD on 08/22/2010   Method used:   Print then Give to Patient   RxID:   [id number]   Orders Added: 1)  Est. Patient Level IV [52841] 2)  Tdap => 19yrs IM [32440]   Immunizations Administered:  Tetanus Vaccine:    Vaccine Type: Tdap    Site:  left deltoid    Mfr: bootrix    Dose: 0.5 ml    Route: IM    Given by: Emilio Math    Exp. Date: 12/09/2011    Lot #: [id number]    VIS given: 12/03/07 version given August 22, 2010.    Physician counseled: yes   Immunizations Administered:  Tetanus Vaccine:    Vaccine Type: Tdap    Site: left deltoid    Mfr: bootrix    Dose: 0.5 ml    Route: IM    Given by: Emilio Math    Exp. Date: 12/09/2011    Lot #: [id number]    VIS given: 12/03/07 version given August 22, 2010.    Physician counseled: yes

## 2010-12-18 NOTE — Telephone Encounter (Signed)
  Phone Note Outgoing Call   Call placed by: Lavell Islam RN,  August 24, 2010 12:30 PM Call placed to: Patient Action Taken: Phone Call Completed Summary of Call: Spoke with patient who states bite area on hand no longer red; knows to complete course of ABX. Initial call taken by: Lavell Islam RN,  August 24, 2010 12:30 PM

## 2010-12-19 ENCOUNTER — Encounter: Payer: Self-pay | Admitting: *Deleted

## 2010-12-19 ENCOUNTER — Emergency Department (INDEPENDENT_AMBULATORY_CARE_PROVIDER_SITE_OTHER)
Admission: EM | Admit: 2010-12-19 | Discharge: 2010-12-19 | Disposition: A | Discharge status: Home / Self Care | Payer: Medicare Other | Source: Home / Self Care | Attending: Family Medicine | Admitting: Family Medicine

## 2010-12-19 DIAGNOSIS — Z23 Encounter for immunization: Secondary | ICD-10-CM

## 2010-12-19 MED ORDER — INFLUENZA VAC TYP A&B SURF ANT IM INJ
0.5000 mL | INJECTION | INTRAMUSCULAR | Status: AC | PRN
  Administered 2010-12-19: 0.5 mL via INTRAMUSCULAR

## 2010-12-20 NOTE — ED Provider Notes (Signed)
History     CSN: [id number] Arrival date & time: 12/19/2010 11:34 AM   First MD Initiated Contact with Patient 12/19/10 1208      Chief Complaint  Patient presents with  . Flu Vaccine     HPI Comments: Patient presents for flu shot.  She is assymptomatic at present   The history is provided by the patient.    Past Medical History  Diagnosis Date  . Carotid artery stenosis   . Hypertension, essential, benign   . PVD (peripheral vascular disease)   . Coronary atherosclerosis of native coronary artery   . Hypercholesteremia   . Rhinitis, allergic     NOS    Past Surgical History  Procedure Date  . Mitral valve repair 05/22/2006    Quadrangular resection of post leaflet w/ sliding leaflet annuloplaty and 24mm Edwards Physio ring  . Coronary artery bypass graft 05/22/2006    Left int mammary to distal left ant descending CA, spahenous vein graft to ramus intermediate branch  . Carotid endarterectomy 03/2008    Right  . Colonoscopy 1996    in [place]    Family History  Problem Relation Age of Onset  . Heart attack Mother     History  Substance Use Topics  . Smoking status: Former Games developer  . Smokeless tobacco: Not on file  . Alcohol Use: Not on file    OB History    Grav Para Term Preterm Abortions TAB SAB Ect Mult Living                  Review of Systems  All other systems reviewed and are negative.    Allergies  Review of patient's allergies indicates no known allergies.  Home Medications   Current Outpatient Rx  Name Route Sig Dispense Refill  . AMLODIPINE BESYLATE 5 MG PO TABS  TAKE ONE TABLET BY MOUTH DAILY 30 tablet 12  . AMOXICILLIN-POT CLAVULANATE 875-125 MG PO TABS Oral Take 1 tablet by mouth 2 (two) times daily.      . ASPIRIN 81 MG PO TABS Oral Take 81 mg by mouth daily.      . ATORVASTATIN CALCIUM 80 MG PO TABS Oral Take 80 mg by mouth daily.      . ENALAPRIL MALEATE 20 MG PO TABS Oral Take 1 tablet (20 mg total) by mouth 2 (two) times daily.  60 tablet 2    Needs f/u  with MD prior to  anymore refills./cy  . FUROSEMIDE 40 MG PO TABS Oral Take 1 tablet (40 mg total) by mouth daily. 30 tablet 3  . METOPROLOL SUCCINATE ER 100 MG PO TB24 Oral Take 100 mg by mouth daily.      Marland Kitchen POTASSIUM CHLORIDE CRYS CR 20 MEQ PO TBCR Oral Take 20 mEq by mouth daily.        BP 169/77  Pulse 92  Temp(Src) 98.2 F (36.8 C) (Oral)  Resp 16  Physical Exam  Nursing note and vitals reviewed.   ED Course  Procedures  none      1. Need for influenza vaccination       MDM  Influenza vaccine administered        Donna Christen, MD 12/20/10 2333

## 2011-01-11 ENCOUNTER — Other Ambulatory Visit: Payer: Self-pay | Admitting: Cardiology

## 2011-01-22 ENCOUNTER — Other Ambulatory Visit: Payer: Self-pay | Admitting: Cardiology

## 2011-02-22 ENCOUNTER — Ambulatory Visit: Payer: Self-pay

## 2011-02-22 ENCOUNTER — Other Ambulatory Visit: Payer: Self-pay

## 2011-03-08 ENCOUNTER — Other Ambulatory Visit: Payer: Self-pay | Admitting: Cardiology

## 2011-03-10 ENCOUNTER — Other Ambulatory Visit: Payer: Self-pay | Admitting: Cardiology

## 2011-04-16 ENCOUNTER — Other Ambulatory Visit: Payer: Self-pay | Admitting: *Deleted

## 2011-04-16 MED ORDER — AMLODIPINE BESYLATE 5 MG PO TABS: 5.0000 mg | ORAL_TABLET | Freq: Every day | ORAL | Status: DC

## 2011-04-16 MED ORDER — FUROSEMIDE 40 MG PO TABS: 40.0000 mg | ORAL_TABLET | Freq: Every day | ORAL | Status: DC

## 2011-04-16 MED ORDER — ATORVASTATIN CALCIUM 80 MG PO TABS: 40.0000 mg | ORAL_TABLET | Freq: Every day | ORAL | Status: DC

## 2011-04-16 MED ORDER — POTASSIUM CHLORIDE CRYS ER 20 MEQ PO TBCR: 20.0000 meq | EXTENDED_RELEASE_TABLET | Freq: Every day | ORAL | Status: DC

## 2011-04-16 MED ORDER — ENALAPRIL MALEATE 20 MG PO TABS: 20.0000 mg | ORAL_TABLET | Freq: Two times a day (BID) | ORAL | Status: DC

## 2011-04-30 ENCOUNTER — Other Ambulatory Visit: Payer: Self-pay | Admitting: Cardiology

## 2011-06-12 ENCOUNTER — Other Ambulatory Visit: Payer: Self-pay | Admitting: Cardiovascular Disease

## 2011-08-01 ENCOUNTER — Other Ambulatory Visit: Payer: Self-pay | Admitting: Cardiology

## 2011-09-05 ENCOUNTER — Other Ambulatory Visit: Payer: Self-pay | Admitting: Cardiology

## 2011-09-20 ENCOUNTER — Other Ambulatory Visit: Payer: Self-pay | Admitting: Cardiology

## 2011-10-25 ENCOUNTER — Other Ambulatory Visit: Payer: Self-pay | Admitting: Cardiology

## 2011-11-07 DIAGNOSIS — Z23 Encounter for immunization: Secondary | ICD-10-CM | POA: Diagnosis not present

## 2011-11-26 ENCOUNTER — Other Ambulatory Visit: Payer: Self-pay | Admitting: Cardiology

## 2012-01-15 ENCOUNTER — Other Ambulatory Visit: Payer: Self-pay | Admitting: Cardiology

## 2012-01-21 ENCOUNTER — Encounter: Payer: Self-pay | Admitting: Vascular Surgery

## 2012-01-22 ENCOUNTER — Other Ambulatory Visit: Payer: Self-pay | Admitting: Cardiology

## 2012-02-18 ENCOUNTER — Other Ambulatory Visit: Payer: Self-pay | Admitting: Cardiology

## 2012-03-20 ENCOUNTER — Other Ambulatory Visit: Payer: Self-pay | Admitting: Cardiology

## 2012-04-25 ENCOUNTER — Other Ambulatory Visit: Payer: Self-pay | Admitting: Cardiology

## 2012-05-08 ENCOUNTER — Other Ambulatory Visit: Payer: Self-pay | Admitting: Emergency Medicine

## 2012-05-08 MED ORDER — AMLODIPINE BESYLATE 5 MG PO TABS: 5.0000 mg | ORAL_TABLET | Freq: Every day | ORAL | Status: DC

## 2012-05-08 MED ORDER — METOPROLOL SUCCINATE ER 100 MG PO TB24: ORAL_TABLET | ORAL | Status: DC

## 2012-05-11 ENCOUNTER — Other Ambulatory Visit: Payer: Self-pay | Admitting: Cardiology

## 2012-05-14 ENCOUNTER — Telehealth: Payer: Self-pay | Admitting: *Deleted

## 2012-05-14 NOTE — Telephone Encounter (Signed)
Sam from the pharmacy called to refill atorvastatin 40 mg for pt I gave a 30 day supply no refills till pt has appt with Dr. Jens Som

## 2012-05-14 NOTE — Telephone Encounter (Signed)
Haven't seen patient since 2011 last note states Lipitor 80 mg 1 tab po qd... Pharmacy is requesting Lipitor 80 mg 1/2  tab po qd. Is this ok to fill until appt in May 2014

## 2012-05-18 ENCOUNTER — Other Ambulatory Visit: Payer: Self-pay | Admitting: Cardiology

## 2012-05-26 ENCOUNTER — Other Ambulatory Visit: Payer: Self-pay | Admitting: Cardiology

## 2012-05-26 NOTE — Telephone Encounter (Signed)
Pt has not seen Crenshaw in a long time I  Tried to call pt x2  No answer . I will continue to call pt to see if she has a pcp. But  If you want me I can give him # 30  No refills

## 2012-06-04 ENCOUNTER — Encounter: Payer: Self-pay | Admitting: Cardiology

## 2012-06-04 ENCOUNTER — Ambulatory Visit (INDEPENDENT_AMBULATORY_CARE_PROVIDER_SITE_OTHER): Payer: Medicare Other | Admitting: Cardiology

## 2012-06-04 VITALS — BP 124/80 | HR 71 | Wt 144.0 lb

## 2012-06-04 DIAGNOSIS — I714 Abdominal aortic aneurysm, without rupture, unspecified: Secondary | ICD-10-CM

## 2012-06-04 DIAGNOSIS — I701 Atherosclerosis of renal artery: Secondary | ICD-10-CM

## 2012-06-04 DIAGNOSIS — I679 Cerebrovascular disease, unspecified: Secondary | ICD-10-CM

## 2012-06-04 DIAGNOSIS — I2510 Atherosclerotic heart disease of native coronary artery without angina pectoris: Secondary | ICD-10-CM

## 2012-06-04 DIAGNOSIS — E7800 Pure hypercholesterolemia, unspecified: Secondary | ICD-10-CM

## 2012-06-04 DIAGNOSIS — I10 Essential (primary) hypertension: Secondary | ICD-10-CM | POA: Diagnosis not present

## 2012-06-04 DIAGNOSIS — Z9889 Other specified postprocedural states: Secondary | ICD-10-CM

## 2012-06-04 NOTE — Assessment & Plan Note (Signed)
Continue aspirin and statin. 

## 2012-06-04 NOTE — Assessment & Plan Note (Signed)
Plan repeat ultrasound.

## 2012-06-04 NOTE — Assessment & Plan Note (Signed)
Continue SBE prophylaxis. Repeat echocardiogram. 

## 2012-06-04 NOTE — Progress Notes (Signed)
HPI: [name] is a pleasant female who has a history of mitral valve repair in May 2008 secondary to severe mitral regurgitation/mitral valve prolapse.  She also had coronary bypassing graft at that time with a LIMA to the LAD and a saphenous vein graft to the ramus intermedius. She also had closure of patent foramen ovale and ligation of the left atrial appendage.  Her most recent echocardiogram was performed in August of 2011.  She had normal LV function.  There was trace aortic insufficiency.  There was mild MS with mean gradient of 6 mmHg and MVA 1.82 cm2.  The right atrium was mildly dilated. She had carotid endarterectomy in March 2010. Abdominal ultrasound in August of 2012 revealed a 2.8 x 2.8 cm AAA and moderate bilateral iliac stenoses. I last saw her in July of 2011. Since then, she has some dyspnea on exertion which is unchanged. No orthopnea, PND, pedal edema, chest pain or syncope.  Current Outpatient Prescriptions  Medication Sig Dispense Refill  . amLODipine (NORVASC) 5 MG tablet Take 1 tablet (5 mg total) by mouth daily.  30 tablet  0  . amoxicillin-clavulanate (AUGMENTIN) 875-125 MG per tablet Take 1 tablet by mouth 2 (two) times daily.        Marland Kitchen aspirin 81 MG tablet Take 81 mg by mouth daily.        Marland Kitchen atorvastatin (LIPITOR) 80 MG tablet TAKE 1/2 TABLET EVERY DAY  30 tablet  0  . enalapril (VASOTEC) 20 MG tablet Take 1 tablet (20 mg total) by mouth 2 (two) times daily.  60 tablet  2  . enalapril (VASOTEC) 20 MG tablet TAKE 1 TABLET TWICE A DAY  60 tablet  0  . KLOR-CON M20 20 MEQ tablet TAKE 1 TABLET EVERY DAY  30 tablet  0  . metoprolol succinate (TOPROL-XL) 100 MG 24 hr tablet TAKE 1 TABLET EVERY DAY  30 tablet  0   No current facility-administered medications for this visit.     Past Medical History  Diagnosis Date  . Carotid artery stenosis   . Hypertension, essential, benign   . PVD (peripheral vascular disease)   . Coronary atherosclerosis of native coronary artery    . Hypercholesteremia   . Rhinitis, allergic     NOS    Past Surgical History  Procedure Laterality Date  . Mitral valve repair  05/22/2006    Quadrangular resection of post leaflet w/ sliding leaflet annuloplaty and 24mm Edwards Physio ring  . Coronary artery bypass graft  05/22/2006    Left int mammary to distal left ant descending CA, spahenous vein graft to ramus intermediate branch  . Carotid endarterectomy  03/2008    Right  . Colonoscopy  1996    in [place]    History   Social History  . Marital Status: Married    Spouse Name: N/A    Number of Children: N/A  . Years of Education: N/A   Occupational History  . Retired from [employer] in [place] and [employer]   Social History Main Topics  . Smoking status: Former Games developer  . Smokeless tobacco: Not on file  . Alcohol Use: Not on file  . Drug Use: Not on file  . Sexually Active: Not on file   Other Topics Concern  . Not on file   Social History Narrative  . No narrative on file    ROS: no fevers or chills, productive cough, hemoptysis, dysphasia, odynophagia, melena,  hematochezia, dysuria, hematuria, rash, seizure activity, orthopnea, PND, pedal edema, claudication. Remaining systems are negative.  Physical Exam: Well-developed well-nourished in no acute distress.  Skin is warm and dry.  HEENT is normal.  Neck is supple.  Chest with diminished breath sounds throughout Cardiovascular exam is regular rate and rhythm. 2/6 systolic murmur left sternal border. Abdominal exam nontender or distended. No masses palpated. Extremities show no edema. neuro grossly intact  ECG sinus rhythm at a rate of 71. No ST changes.

## 2012-06-04 NOTE — Assessment & Plan Note (Signed)
Continue statin. Check lipids and liver. 

## 2012-06-04 NOTE — Patient Instructions (Signed)
Your physician wants you to follow-up in: ONE YEAR WITH DR Shelda Pal will receive a reminder letter in the mail two months in advance. If you don't receive a letter, please call our office to schedule the follow-up appointment.   Your physician has requested that you have a carotid duplex. This test is an ultrasound of the carotid arteries in your neck. It looks at blood flow through these arteries that supply the brain with blood. Allow one hour for this exam. There are no restrictions or special instructions.   Your physician has requested that you have an echocardiogram. Echocardiography is a painless test that uses sound waves to create images of your heart. It provides your doctor with information about the size and shape of your heart and how well your heart's chambers and valves are working. This procedure takes approximately one hour. There are no restrictions for this procedure.   Your physician has requested that you have an abdominal aorta duplex. During this test, an ultrasound is used to evaluate the aorta. Allow 30 minutes for this exam. Do not eat after midnight the day before and avoid carbonated beverages   Your physician recommends that you return for lab work in: WITH ABDOMINAL ULTRASOUND

## 2012-06-04 NOTE — Assessment & Plan Note (Signed)
Continue aspirin and statin. Schedule followup carotid Dopplers. 

## 2012-06-04 NOTE — Assessment & Plan Note (Signed)
Continue present medications. Check potassium and renal function. 

## 2012-06-06 ENCOUNTER — Other Ambulatory Visit: Payer: Self-pay | Admitting: Cardiology

## 2012-06-23 ENCOUNTER — Ambulatory Visit (HOSPITAL_COMMUNITY): Payer: Medicare Other | Attending: Cardiology

## 2012-06-23 DIAGNOSIS — I10 Essential (primary) hypertension: Secondary | ICD-10-CM | POA: Insufficient documentation

## 2012-06-23 DIAGNOSIS — I059 Rheumatic mitral valve disease, unspecified: Secondary | ICD-10-CM

## 2012-06-23 DIAGNOSIS — E785 Hyperlipidemia, unspecified: Secondary | ICD-10-CM | POA: Diagnosis not present

## 2012-06-23 DIAGNOSIS — Z09 Encounter for follow-up examination after completed treatment for conditions other than malignant neoplasm: Secondary | ICD-10-CM | POA: Diagnosis not present

## 2012-06-23 DIAGNOSIS — Z87891 Personal history of nicotine dependence: Secondary | ICD-10-CM | POA: Insufficient documentation

## 2012-06-23 DIAGNOSIS — Z9889 Other specified postprocedural states: Secondary | ICD-10-CM

## 2012-06-23 NOTE — Progress Notes (Signed)
Echocardiogram performed.  

## 2012-06-24 ENCOUNTER — Other Ambulatory Visit: Payer: Self-pay | Admitting: Cardiology

## 2012-06-25 ENCOUNTER — Other Ambulatory Visit: Payer: Self-pay | Admitting: Cardiology

## 2012-06-25 ENCOUNTER — Other Ambulatory Visit: Payer: Medicare Other

## 2012-06-26 ENCOUNTER — Other Ambulatory Visit: Payer: Self-pay | Admitting: Cardiology

## 2012-07-03 ENCOUNTER — Encounter (INDEPENDENT_AMBULATORY_CARE_PROVIDER_SITE_OTHER): Payer: Medicare Other

## 2012-07-03 DIAGNOSIS — I6529 Occlusion and stenosis of unspecified carotid artery: Secondary | ICD-10-CM

## 2012-07-03 DIAGNOSIS — I679 Cerebrovascular disease, unspecified: Secondary | ICD-10-CM

## 2012-07-07 ENCOUNTER — Encounter (INDEPENDENT_AMBULATORY_CARE_PROVIDER_SITE_OTHER): Payer: Medicare Other

## 2012-07-07 ENCOUNTER — Other Ambulatory Visit: Payer: Medicare Other

## 2012-07-07 DIAGNOSIS — I739 Peripheral vascular disease, unspecified: Secondary | ICD-10-CM

## 2012-07-07 DIAGNOSIS — I701 Atherosclerosis of renal artery: Secondary | ICD-10-CM | POA: Diagnosis not present

## 2012-07-07 DIAGNOSIS — Z9889 Other specified postprocedural states: Secondary | ICD-10-CM

## 2012-07-07 DIAGNOSIS — I714 Abdominal aortic aneurysm, without rupture, unspecified: Secondary | ICD-10-CM | POA: Diagnosis not present

## 2012-07-07 DIAGNOSIS — I679 Cerebrovascular disease, unspecified: Secondary | ICD-10-CM

## 2012-07-07 DIAGNOSIS — E7800 Pure hypercholesterolemia, unspecified: Secondary | ICD-10-CM | POA: Diagnosis not present

## 2012-07-07 DIAGNOSIS — I10 Essential (primary) hypertension: Secondary | ICD-10-CM

## 2012-07-07 DIAGNOSIS — I2510 Atherosclerotic heart disease of native coronary artery without angina pectoris: Secondary | ICD-10-CM

## 2012-07-07 LAB — BASIC METABOLIC PANEL
BUN: 24 mg/dL — ABNORMAL HIGH (ref 6–23)
Calcium: 9.7 mg/dL (ref 8.4–10.5)
Creat: 0.98 mg/dL (ref 0.50–1.10)

## 2012-07-07 LAB — HEPATIC FUNCTION PANEL
ALT: 17 U/L (ref 0–35)
AST: 29 U/L (ref 0–37)
Alkaline Phosphatase: 54 U/L (ref 39–117)
Indirect Bilirubin: 0.9 mg/dL (ref 0.0–0.9)
Total Protein: 7.2 g/dL (ref 6.0–8.3)

## 2012-07-07 LAB — LIPID PANEL
Cholesterol: 148 mg/dL (ref 0–200)
Triglycerides: 70 mg/dL (ref <150)
VLDL: 14 mg/dL (ref 0–40)

## 2012-07-08 ENCOUNTER — Encounter: Payer: Self-pay | Admitting: *Deleted

## 2012-07-08 ENCOUNTER — Other Ambulatory Visit: Payer: Self-pay | Admitting: Cardiology

## 2012-07-22 ENCOUNTER — Other Ambulatory Visit: Payer: Self-pay | Admitting: Cardiology

## 2012-07-23 ENCOUNTER — Other Ambulatory Visit: Payer: Self-pay | Admitting: *Deleted

## 2012-07-25 NOTE — Telephone Encounter (Signed)
Medication is not on list not sure if patient is suppose to be taken this medication. Will route this to DR. Crenshaw nurse for review. Will fill if its ok

## 2012-08-05 ENCOUNTER — Other Ambulatory Visit: Payer: Self-pay | Admitting: Cardiology

## 2012-08-14 ENCOUNTER — Other Ambulatory Visit: Payer: Self-pay | Admitting: Cardiology

## 2012-08-20 ENCOUNTER — Other Ambulatory Visit: Payer: Self-pay | Admitting: Cardiology

## 2012-08-26 ENCOUNTER — Telehealth: Payer: Self-pay

## 2012-08-26 ENCOUNTER — Other Ambulatory Visit: Payer: Self-pay | Admitting: *Deleted

## 2012-08-26 ENCOUNTER — Other Ambulatory Visit: Payer: Self-pay

## 2012-08-26 MED ORDER — AMLODIPINE BESYLATE 5 MG PO TABS: ORAL_TABLET | ORAL | Status: DC

## 2012-08-26 MED ORDER — ENALAPRIL MALEATE 20 MG PO TABS: 20.0000 mg | ORAL_TABLET | Freq: Two times a day (BID) | ORAL | Status: DC

## 2012-08-26 NOTE — Telephone Encounter (Signed)
RETURING CALL TO PHARM TO MAKE ALL HER RX 90 DAYS SUPPLY WITH 3 REFILL

## 2012-08-26 NOTE — Telephone Encounter (Signed)
ERROR

## 2012-09-02 ENCOUNTER — Other Ambulatory Visit: Payer: Self-pay | Admitting: Cardiology

## 2012-09-17 ENCOUNTER — Other Ambulatory Visit: Payer: Self-pay | Admitting: Cardiology

## 2012-10-20 ENCOUNTER — Other Ambulatory Visit: Payer: Self-pay | Admitting: Cardiology

## 2012-11-17 ENCOUNTER — Other Ambulatory Visit: Payer: Self-pay | Admitting: Cardiology

## 2012-11-20 DIAGNOSIS — Z23 Encounter for immunization: Secondary | ICD-10-CM | POA: Diagnosis not present

## 2012-12-17 ENCOUNTER — Other Ambulatory Visit: Payer: Self-pay | Admitting: Cardiology

## 2012-12-31 ENCOUNTER — Other Ambulatory Visit: Payer: Self-pay | Admitting: Cardiology

## 2013-01-14 ENCOUNTER — Other Ambulatory Visit: Payer: Self-pay | Admitting: Cardiovascular Disease

## 2013-01-29 ENCOUNTER — Other Ambulatory Visit: Payer: Self-pay | Admitting: Cardiovascular Disease

## 2013-03-31 ENCOUNTER — Other Ambulatory Visit: Payer: Self-pay

## 2013-03-31 MED ORDER — ENALAPRIL MALEATE 20 MG PO TABS: ORAL_TABLET | ORAL | Status: DC

## 2013-04-01 ENCOUNTER — Other Ambulatory Visit: Payer: Self-pay

## 2013-04-01 MED ORDER — AMLODIPINE BESYLATE 5 MG PO TABS: ORAL_TABLET | ORAL | Status: DC

## 2013-06-24 ENCOUNTER — Other Ambulatory Visit: Payer: Self-pay

## 2013-06-24 MED ORDER — ENALAPRIL MALEATE 20 MG PO TABS: ORAL_TABLET | ORAL | Status: DC

## 2013-07-20 ENCOUNTER — Ambulatory Visit (HOSPITAL_COMMUNITY)
Admission: RE | Admit: 2013-07-20 | Discharge: 2013-07-20 | Disposition: A | Discharge status: Home / Self Care | Payer: Medicare Other | Source: Ambulatory Visit | Attending: Cardiology | Admitting: Cardiology

## 2013-07-20 ENCOUNTER — Other Ambulatory Visit (HOSPITAL_COMMUNITY): Payer: Self-pay | Admitting: Cardiology

## 2013-07-20 DIAGNOSIS — I714 Abdominal aortic aneurysm, without rupture, unspecified: Secondary | ICD-10-CM | POA: Insufficient documentation

## 2013-07-20 DIAGNOSIS — L98499 Non-pressure chronic ulcer of skin of other sites with unspecified severity: Secondary | ICD-10-CM | POA: Diagnosis not present

## 2013-07-20 DIAGNOSIS — I739 Peripheral vascular disease, unspecified: Secondary | ICD-10-CM

## 2013-07-20 NOTE — Progress Notes (Addendum)
Abdominal Aorta Duplex Completed .No change in size when compared to prior study of  2.8 cm.  [name], BS, RDMS, RVT

## 2013-07-21 ENCOUNTER — Other Ambulatory Visit: Payer: Self-pay | Admitting: *Deleted

## 2013-07-21 MED ORDER — POTASSIUM CHLORIDE CRYS ER 20 MEQ PO TBCR: EXTENDED_RELEASE_TABLET | ORAL | Status: DC

## 2013-08-20 ENCOUNTER — Other Ambulatory Visit: Payer: Self-pay

## 2013-08-20 MED ORDER — POTASSIUM CHLORIDE CRYS ER 20 MEQ PO TBCR: EXTENDED_RELEASE_TABLET | ORAL | Status: DC

## 2013-08-21 ENCOUNTER — Other Ambulatory Visit: Payer: Self-pay

## 2013-09-03 ENCOUNTER — Other Ambulatory Visit: Payer: Self-pay | Admitting: *Deleted

## 2013-09-03 MED ORDER — ATORVASTATIN CALCIUM 40 MG PO TABS: ORAL_TABLET | ORAL | Status: DC

## 2013-09-04 ENCOUNTER — Other Ambulatory Visit: Payer: Self-pay

## 2013-09-04 MED ORDER — ENALAPRIL MALEATE 20 MG PO TABS: ORAL_TABLET | ORAL | Status: DC

## 2013-09-04 MED ORDER — POTASSIUM CHLORIDE CRYS ER 20 MEQ PO TBCR: EXTENDED_RELEASE_TABLET | ORAL | Status: DC

## 2013-09-30 ENCOUNTER — Ambulatory Visit (INDEPENDENT_AMBULATORY_CARE_PROVIDER_SITE_OTHER): Payer: Medicare Other | Admitting: Cardiology

## 2013-09-30 ENCOUNTER — Encounter: Payer: Self-pay | Admitting: Cardiology

## 2013-09-30 VITALS — BP 122/70 | HR 64 | Wt 136.0 lb

## 2013-09-30 DIAGNOSIS — I714 Abdominal aortic aneurysm, without rupture, unspecified: Secondary | ICD-10-CM | POA: Diagnosis not present

## 2013-09-30 DIAGNOSIS — I679 Cerebrovascular disease, unspecified: Secondary | ICD-10-CM | POA: Diagnosis not present

## 2013-09-30 DIAGNOSIS — I10 Essential (primary) hypertension: Secondary | ICD-10-CM

## 2013-09-30 DIAGNOSIS — I2510 Atherosclerotic heart disease of native coronary artery without angina pectoris: Secondary | ICD-10-CM | POA: Diagnosis not present

## 2013-09-30 DIAGNOSIS — I701 Atherosclerosis of renal artery: Secondary | ICD-10-CM | POA: Diagnosis not present

## 2013-09-30 DIAGNOSIS — E7800 Pure hypercholesterolemia, unspecified: Secondary | ICD-10-CM

## 2013-09-30 DIAGNOSIS — Z9889 Other specified postprocedural states: Secondary | ICD-10-CM

## 2013-09-30 NOTE — Assessment & Plan Note (Signed)
Continue SBE prophylaxis. 

## 2013-09-30 NOTE — Progress Notes (Signed)
HPI: FU mitral valve repair in May 2008 secondary to severe mitral regurgitation/mitral valve prolapse. She also had coronary bypassing graft at that time with a LIMA to the LAD and a saphenous vein graft to the ramus intermedius. She also had closure of patent foramen ovale and ligation of the left atrial appendage. Her most recent echocardiogram was performed in June 2014. She had normal LV function and mild MR; mild to moderate TR. She had carotid endarterectomy in March 2010. Carotid dopplers 6/14 showed 1-39% bilateral stenosis and fu recommended one year. Abdominal ultrasound in July 2015 revealed a 2.7 x 2.8 cm AAA and moderate bilateral iliac stenoses. Since last seen, the patient has dyspnea with more extreme activities but not with routine activities. It is relieved with rest. It is not associated with chest pain. There is no orthopnea, PND or pedal edema. There is no syncope or palpitations. There is no exertional chest pain.    Current Outpatient Prescriptions  Medication Sig Dispense Refill  . amLODipine (NORVASC) 5 MG tablet TAKE 1 TABLET EVERY DAY  30 tablet  4  . aspirin 81 MG tablet Take 81 mg by mouth daily.        Marland Kitchen atorvastatin (LIPITOR) 40 MG tablet TAKE 1 TABLET BY MOUTH EVERY DAY  30 tablet  1  . enalapril (VASOTEC) 20 MG tablet TAKE 1 TABLET TWICE A DAY  60 tablet  1  . furosemide (LASIX) 40 MG tablet TAKE 1 TABLET BY MOUTH EVERY DAY  30 tablet  12  . metoprolol succinate (TOPROL-XL) 100 MG 24 hr tablet TAKE 1 TABLET EVERY DAY  90 tablet  0  . potassium chloride SA (KLOR-CON M20) 20 MEQ tablet TAKE 1 TABLET EVERY DAY  30 tablet  1   No current facility-administered medications for this visit.     Past Medical History  Diagnosis Date  . Carotid artery stenosis   . Hypertension, essential, benign   . PVD (peripheral vascular disease)   . Coronary atherosclerosis of native coronary artery   . Hypercholesteremia   . Rhinitis, allergic     NOS  . AAA (abdominal  aortic aneurysm)     Past Surgical History  Procedure Laterality Date  . Mitral valve repair  05/22/2006    Quadrangular resection of post leaflet w/ sliding leaflet annuloplaty and 24mm Edwards Physio ring  . Coronary artery bypass graft  05/22/2006    Left int mammary to distal left ant descending CA, spahenous vein graft to ramus intermediate branch  . Carotid endarterectomy  03/2008    Right  . Colonoscopy  1996    in [place]    History   Social History  . Marital Status: Married    Spouse Name: N/A    Number of Children: N/A  . Years of Education: N/A   Occupational History  . Retired from [employer] in [place] and [employer]   Social History Main Topics  . Smoking status: Former Games developer  . Smokeless tobacco: Not on file  . Alcohol Use: Not on file  . Drug Use: Not on file  . Sexual Activity: Not on file   Other Topics Concern  . Not on file   Social History Narrative  . No narrative on file    ROS: no fevers or chills, productive cough, hemoptysis, dysphasia, odynophagia, melena, hematochezia, dysuria, hematuria, rash, seizure activity, orthopnea, PND, pedal edema, claudication. Remaining systems are negative.  Physical Exam: Well-developed  well-nourished in no acute distress.  Skin is warm and dry.  HEENT is normal.  Neck is supple.  Chest is clear to auscultation with normal expansion.  Cardiovascular exam is regular rate and rhythm.  Abdominal exam nontender or distended. No masses palpated. Extremities show no edema. neuro grossly intact  ECG Sinus rhythm at a rate of 64. Right axis deviation. Nonspecific ST changes

## 2013-09-30 NOTE — Assessment & Plan Note (Signed)
Continue aspirin and statin. She declines repeat carotid Dopplers today.

## 2013-09-30 NOTE — Assessment & Plan Note (Signed)
Continue statin. Check lipids and liver. 

## 2013-09-30 NOTE — Patient Instructions (Signed)
Your physician wants you to follow-up in: ONE YEAR WITH DR Shelda Pal will receive a reminder letter in the mail two months in advance. If you don't receive a letter, please call our office to schedule the follow-up appointment.   Your physician recommends that you return for lab work FASTING

## 2013-09-30 NOTE — Assessment & Plan Note (Signed)
Followup abdominal ultrasound July 2016.

## 2013-09-30 NOTE — Assessment & Plan Note (Signed)
Blood pressure controlled. Continue present medications. Check potassium and renal function. 

## 2013-09-30 NOTE — Assessment & Plan Note (Signed)
Continue aspirin and statin. 

## 2013-10-06 DIAGNOSIS — I679 Cerebrovascular disease, unspecified: Secondary | ICD-10-CM | POA: Diagnosis not present

## 2013-10-06 DIAGNOSIS — Z9889 Other specified postprocedural states: Secondary | ICD-10-CM | POA: Diagnosis not present

## 2013-10-06 DIAGNOSIS — I2510 Atherosclerotic heart disease of native coronary artery without angina pectoris: Secondary | ICD-10-CM | POA: Diagnosis not present

## 2013-10-06 DIAGNOSIS — E7800 Pure hypercholesterolemia, unspecified: Secondary | ICD-10-CM | POA: Diagnosis not present

## 2013-10-06 DIAGNOSIS — I714 Abdominal aortic aneurysm, without rupture, unspecified: Secondary | ICD-10-CM | POA: Diagnosis not present

## 2013-10-07 ENCOUNTER — Encounter: Payer: Self-pay | Admitting: *Deleted

## 2013-10-07 LAB — BASIC METABOLIC PANEL WITH GFR
BUN: 17 mg/dL (ref 6–23)
CO2: 27 mEq/L (ref 19–32)
CREATININE: 0.91 mg/dL (ref 0.50–1.10)
Calcium: 9.7 mg/dL (ref 8.4–10.5)
Chloride: 103 mEq/L (ref 96–112)
GFR, EST AFRICAN AMERICAN: 70 mL/min
GFR, Est Non African American: 61 mL/min
GLUCOSE: 96 mg/dL (ref 70–99)
POTASSIUM: 4.6 meq/L (ref 3.5–5.3)
Sodium: 138 mEq/L (ref 135–145)

## 2013-10-07 LAB — HEPATIC FUNCTION PANEL
ALK PHOS: 57 U/L (ref 39–117)
ALT: 19 U/L (ref 0–35)
AST: 22 U/L (ref 0–37)
Albumin: 4.2 g/dL (ref 3.5–5.2)
BILIRUBIN DIRECT: 0.2 mg/dL (ref 0.0–0.3)
BILIRUBIN INDIRECT: 0.9 mg/dL (ref 0.2–1.2)
Total Bilirubin: 1.1 mg/dL (ref 0.2–1.2)
Total Protein: 7.1 g/dL (ref 6.0–8.3)

## 2013-10-07 LAB — LIPID PANEL
Cholesterol: 137 mg/dL (ref 0–200)
HDL: 56 mg/dL (ref >39)
LDL Cholesterol: 65 mg/dL (ref 0–99)
Total CHOL/HDL Ratio: 2.4 Ratio
Triglycerides: 81 mg/dL (ref <150)
VLDL: 16 mg/dL (ref 0–40)

## 2013-10-21 ENCOUNTER — Ambulatory Visit: Payer: Medicare Other | Admitting: Cardiology

## 2013-10-22 ENCOUNTER — Ambulatory Visit: Payer: Medicare Other | Admitting: Cardiology

## 2013-10-27 ENCOUNTER — Other Ambulatory Visit: Payer: Self-pay

## 2013-10-27 MED ORDER — FUROSEMIDE 40 MG PO TABS: ORAL_TABLET | ORAL | Status: DC

## 2013-10-27 MED ORDER — ATORVASTATIN CALCIUM 40 MG PO TABS: ORAL_TABLET | ORAL | Status: DC

## 2013-10-27 MED ORDER — ENALAPRIL MALEATE 20 MG PO TABS: ORAL_TABLET | ORAL | Status: DC

## 2013-10-27 MED ORDER — AMLODIPINE BESYLATE 5 MG PO TABS: ORAL_TABLET | ORAL | Status: DC

## 2013-10-27 MED ORDER — POTASSIUM CHLORIDE CRYS ER 20 MEQ PO TBCR: EXTENDED_RELEASE_TABLET | ORAL | Status: DC

## 2013-10-27 MED ORDER — METOPROLOL SUCCINATE ER 100 MG PO TB24: ORAL_TABLET | ORAL | Status: DC

## 2013-11-06 DIAGNOSIS — Z23 Encounter for immunization: Secondary | ICD-10-CM | POA: Diagnosis not present

## 2014-06-15 ENCOUNTER — Other Ambulatory Visit: Payer: Self-pay

## 2014-06-15 MED ORDER — AMLODIPINE BESYLATE 5 MG PO TABS: ORAL_TABLET | ORAL | Status: DC

## 2014-08-03 ENCOUNTER — Other Ambulatory Visit: Payer: Self-pay

## 2014-08-03 MED ORDER — ENALAPRIL MALEATE 20 MG PO TABS: ORAL_TABLET | ORAL | Status: DC

## 2014-08-16 ENCOUNTER — Other Ambulatory Visit: Payer: Self-pay

## 2014-08-16 MED ORDER — ATORVASTATIN CALCIUM 40 MG PO TABS: ORAL_TABLET | ORAL | Status: DC

## 2014-08-16 NOTE — Telephone Encounter (Signed)
Melissa Bunting, MD at 09/30/2013 5:38 AM   atorvastatin (LIPITOR) 40 MG tablet TAKE 1 TABLET BY MOUTH EVERY DAY

## 2014-09-15 ENCOUNTER — Other Ambulatory Visit: Payer: Self-pay | Admitting: *Deleted

## 2014-09-15 MED ORDER — ATORVASTATIN CALCIUM 40 MG PO TABS: ORAL_TABLET | ORAL | Status: DC

## 2014-10-14 ENCOUNTER — Other Ambulatory Visit: Payer: Self-pay | Admitting: *Deleted

## 2014-10-14 MED ORDER — ATORVASTATIN CALCIUM 40 MG PO TABS: ORAL_TABLET | ORAL | Status: DC

## 2014-10-22 ENCOUNTER — Other Ambulatory Visit: Payer: Self-pay

## 2014-10-22 MED ORDER — POTASSIUM CHLORIDE CRYS ER 20 MEQ PO TBCR: EXTENDED_RELEASE_TABLET | ORAL | Status: DC

## 2014-10-22 NOTE — Telephone Encounter (Signed)
Melissa Bunting, MD at 09/30/2013 5:38 AM  potassium chloride SA (KLOR-CON M20) 20 MEQ tabletTAKE 1 TABLET EVERY DAY HYPERTENSION, BENIGN ESSENTIAL - Melissa Bunting, MD at 09/30/2013 3:29 PM     Status: Written Related Problem: HYPERTENSION, BENIGN ESSENTIAL   Expand All Collapse All   Blood pressure controlled. Continue present medications. Check potassium and renal function.

## 2014-10-29 ENCOUNTER — Other Ambulatory Visit: Payer: Self-pay | Admitting: *Deleted

## 2014-10-29 ENCOUNTER — Other Ambulatory Visit: Payer: Self-pay

## 2014-10-29 MED ORDER — ENALAPRIL MALEATE 20 MG PO TABS: ORAL_TABLET | ORAL | Status: DC

## 2014-11-10 ENCOUNTER — Other Ambulatory Visit: Payer: Self-pay | Admitting: *Deleted

## 2014-11-10 MED ORDER — ATORVASTATIN CALCIUM 40 MG PO TABS: ORAL_TABLET | ORAL | Status: DC

## 2014-11-12 ENCOUNTER — Other Ambulatory Visit: Payer: Self-pay | Admitting: *Deleted

## 2014-11-12 MED ORDER — FUROSEMIDE 40 MG PO TABS: ORAL_TABLET | ORAL | Status: DC

## 2014-11-18 ENCOUNTER — Other Ambulatory Visit: Payer: Self-pay | Admitting: Cardiology

## 2014-11-18 MED ORDER — METOPROLOL SUCCINATE ER 100 MG PO TB24: ORAL_TABLET | ORAL | Status: DC

## 2014-11-24 ENCOUNTER — Other Ambulatory Visit: Payer: Self-pay

## 2014-11-24 MED ORDER — POTASSIUM CHLORIDE CRYS ER 20 MEQ PO TBCR: EXTENDED_RELEASE_TABLET | ORAL | Status: DC

## 2014-11-26 ENCOUNTER — Other Ambulatory Visit: Payer: Self-pay

## 2014-11-26 MED ORDER — ENALAPRIL MALEATE 20 MG PO TABS: ORAL_TABLET | ORAL | Status: DC

## 2014-12-08 ENCOUNTER — Other Ambulatory Visit: Payer: Self-pay | Admitting: Cardiology

## 2014-12-08 MED ORDER — ATORVASTATIN CALCIUM 40 MG PO TABS: ORAL_TABLET | ORAL | Status: DC

## 2014-12-08 NOTE — Telephone Encounter (Signed)
Rx request sent to pharmacy.  

## 2014-12-14 ENCOUNTER — Other Ambulatory Visit: Payer: Self-pay | Admitting: Cardiology

## 2014-12-14 ENCOUNTER — Telehealth: Payer: Self-pay | Admitting: Cardiology

## 2014-12-14 MED ORDER — ENALAPRIL MALEATE 20 MG PO TABS: ORAL_TABLET | ORAL | Status: DC

## 2014-12-14 NOTE — Telephone Encounter (Signed)
°*  STAT* If patient is at the pharmacy, call can be transferred to refill team.   1. Which medications need to be refilled? (please list name of each medication and dose if known) Enalapril 20mg    2. Which pharmacy/location (including street and city if local pharmacy) is [facility] [address] [facility] [place]   3. Do they need a 30 day or 90 day supply? 90  [facility]

## 2014-12-14 NOTE — Telephone Encounter (Signed)
New message     *STAT* If patient is at the pharmacy, call can be transferred to refill team.   1. Which medications need to be refilled? (please list name of each medication and dose if known) enalapril  20 mg   2. Which pharmacy/location (including street and city if local pharmacy) is [facility] be sent to[facility] [place]  [address]   3. Do they need a 30 day or 90 day supply? 30 days

## 2014-12-15 NOTE — Telephone Encounter (Signed)
°*  STAT* If patient is at the pharmacy, call can be transferred to refill team.   1. Which medications need to be refilled? (please list name of each medication and dose if known) Enalapril Maleate-pt has appt for 01-19-15-need this today  2. Which pharmacy/location (including street and city if local pharmacy) is [phone]  3. Do they need a 30 day or 90 day supply? 90 and refills

## 2014-12-15 NOTE — Telephone Encounter (Signed)
Medication refilled already. See note from 12/14/14.

## 2014-12-16 ENCOUNTER — Other Ambulatory Visit: Payer: Self-pay

## 2014-12-16 MED ORDER — ENALAPRIL MALEATE 20 MG PO TABS: ORAL_TABLET | ORAL | Status: DC

## 2014-12-22 ENCOUNTER — Other Ambulatory Visit: Payer: Self-pay | Admitting: *Deleted

## 2014-12-22 MED ORDER — POTASSIUM CHLORIDE CRYS ER 20 MEQ PO TBCR: EXTENDED_RELEASE_TABLET | ORAL | Status: DC

## 2014-12-24 ENCOUNTER — Other Ambulatory Visit: Payer: Self-pay | Admitting: *Deleted

## 2014-12-24 MED ORDER — FUROSEMIDE 40 MG PO TABS: ORAL_TABLET | ORAL | Status: DC

## 2014-12-27 ENCOUNTER — Other Ambulatory Visit: Payer: Self-pay | Admitting: *Deleted

## 2014-12-27 MED ORDER — POTASSIUM CHLORIDE CRYS ER 20 MEQ PO TBCR: EXTENDED_RELEASE_TABLET | ORAL | Status: DC

## 2014-12-27 MED ORDER — METOPROLOL SUCCINATE ER 100 MG PO TB24: ORAL_TABLET | ORAL | Status: DC

## 2014-12-27 NOTE — Telephone Encounter (Signed)
#  20 K+ and Metorpolol given to get her through until her appointment.

## 2015-01-03 ENCOUNTER — Other Ambulatory Visit: Payer: Self-pay | Admitting: Cardiology

## 2015-01-03 NOTE — Telephone Encounter (Signed)
°*  STAT* If patient is at the pharmacy, call can be transferred to refill team.   1. Which medications need to be refilled? (please list name of each medication and dose if known) Enalapril   2. Which pharmacy/location (including street and city if local pharmacy) is medication to be sent to?[facility] [address] [facility] [place]   3. Do they need a 30 day or 90 day supply? 90

## 2015-01-04 MED ORDER — ENALAPRIL MALEATE 20 MG PO TABS: ORAL_TABLET | ORAL | Status: DC

## 2015-01-04 NOTE — Telephone Encounter (Signed)
Enalapril #30 sent in to get her to her appt 01/19/15.

## 2015-01-05 ENCOUNTER — Encounter: Payer: Self-pay | Admitting: *Deleted

## 2015-01-18 NOTE — Progress Notes (Signed)
HPI: FU mitral valve repair in May 2008 secondary to severe mitral regurgitation/mitral valve prolapse. She also had coronary bypassing graft at that time with a LIMA to the LAD and a saphenous vein graft to the ramus intermedius. She also had closure of patent foramen ovale and ligation of the left atrial appendage. Her most recent echocardiogram was performed in June 2014. She had normal LV function and mild MR; mild to moderate TR. She had carotid endarterectomy in March 2010. Carotid dopplers 6/14 showed 1-39% bilateral stenosis and fu recommended one year. Abdominal ultrasound in July 2015 revealed a 2.7 x 2.8 cm AAA and moderate bilateral iliac stenoses. Since last seen,   Current Outpatient Prescriptions  Medication Sig Dispense Refill  . amLODipine (NORVASC) 5 MG tablet TAKE 1 TABLET EVERY DAY 90 tablet 0  . aspirin 81 MG tablet Take 81 mg by mouth daily.      Marland Kitchen. atorvastatin (LIPITOR) 40 MG tablet TAKE 1 TABLET BY MOUTH EVERY DAY 30 tablet 1  . enalapril (VASOTEC) 20 MG tablet TAKE 1 TABLET TWICE A DAY 30 tablet 0  . furosemide (LASIX) 40 MG tablet TAKE 1 TABLET BY MOUTH EVERY DAY 90 tablet 0  . metoprolol succinate (TOPROL-XL) 100 MG 24 hr tablet TAKE 1 TABLET EVERY DAY 20 tablet 0  . potassium chloride SA (KLOR-CON M20) 20 MEQ tablet TAKE 1 TABLET EVERY DAY 20 tablet 0   No current facility-administered medications for this visit.     Past Medical History  Diagnosis Date  . Carotid artery stenosis   . Hypertension, essential, benign   . PVD (peripheral vascular disease) (HCC)   . Coronary atherosclerosis of native coronary artery   . Hypercholesteremia   . Rhinitis, allergic     NOS  . AAA (abdominal aortic aneurysm) [facility](HCC)     Past Surgical History  Procedure Laterality Date  . Mitral valve repair  05/22/2006    Quadrangular resection of post leaflet w/ sliding leaflet annuloplaty and 24mm Edwards Physio ring  . Coronary artery bypass graft  05/22/2006    Left int  mammary to distal left ant descending CA, spahenous vein graft to ramus intermediate branch  . Carotid endarterectomy  03/2008    Right  . Colonoscopy  1996    in [place]    Social History   Social History  . Marital Status: Married    Spouse Name: N/A  . Number of Children: N/A  . Years of Education: N/A   Occupational History  . 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 Smoking status: Former Games developermoker  . Smokeless tobacco: Not on file  . Alcohol Use: Not on file  . Drug Use: Not on file  . Sexual Activity: Not on file   Other Topics Concern  . Not on file   Social History Narrative    ROS: no fevers or chills, productive cough, hemoptysis, dysphasia, odynophagia, melena, hematochezia, dysuria, hematuria, rash, seizure activity, orthopnea, PND, pedal edema, claudication. Remaining systems are negative.  Physical Exam: Well-developed well-nourished in no acute distress.  Skin is warm and dry.  HEENT is normal.  Neck is supple.  Chest is clear to auscultation with normal expansion.  Cardiovascular exam is regular rate and rhythm.  Abdominal exam nontender or distended. No masses palpated. Extremities show no edema. neuro grossly intact  ECG     This encounter was created in error -  please disregard.

## 2015-01-19 ENCOUNTER — Encounter: Payer: Medicare Other | Admitting: Cardiology

## 2015-01-20 ENCOUNTER — Other Ambulatory Visit: Payer: Self-pay | Admitting: *Deleted

## 2015-01-20 ENCOUNTER — Other Ambulatory Visit: Payer: Self-pay | Admitting: Cardiology

## 2015-01-20 MED ORDER — POTASSIUM CHLORIDE CRYS ER 20 MEQ PO TBCR: EXTENDED_RELEASE_TABLET | ORAL | Status: DC

## 2015-01-20 MED ORDER — METOPROLOL SUCCINATE ER 100 MG PO TB24: ORAL_TABLET | ORAL | Status: DC

## 2015-01-20 NOTE — Telephone Encounter (Signed)
Rx request sent to pharmacy.  

## 2015-01-22 ENCOUNTER — Other Ambulatory Visit: Payer: Self-pay | Admitting: Cardiology

## 2015-01-24 ENCOUNTER — Other Ambulatory Visit: Payer: Self-pay | Admitting: Cardiology

## 2015-01-24 MED ORDER — METOPROLOL SUCCINATE ER 100 MG PO TB24: ORAL_TABLET | ORAL | Status: DC

## 2015-01-24 NOTE — Telephone Encounter (Signed)
Rx request sent to pharmacy.  

## 2015-01-25 NOTE — Progress Notes (Signed)
HPI: FU mitral valve repair in May 2008 secondary to severe mitral regurgitation/mitral valve prolapse. She also had coronary bypassing graft at that time with a LIMA to the LAD and a saphenous vein graft to the ramus intermedius. She also had closure of patent foramen ovale and ligation of the left atrial appendage. Her most recent echocardiogram was performed in June 2014. She had normal LV function and mild MR; mild to moderate TR. She had carotid endarterectomy in March 2010. Carotid dopplers 6/14 showed 1-39% bilateral stenosis and fu recommended one year. Abdominal ultrasound in July 2015 revealed a 2.7 x 2.8 cm AAA and moderate bilateral iliac stenoses. Since last seen, the patient has dyspnea with more extreme activities but not with routine activities. It is relieved with rest. It is not associated with chest pain. There is no orthopnea, PND or pedal edema. There is no syncope or palpitations. There is no exertional chest pain.   Current Outpatient Prescriptions  Medication Sig Dispense Refill  . amLODipine (NORVASC) 5 MG tablet TAKE 1 TABLET EVERY DAY 90 tablet 0  . aspirin 81 MG tablet Take 81 mg by mouth daily.      Marland Kitchen atorvastatin (LIPITOR) 40 MG tablet TAKE 1 TABLET BY MOUTH EVERY DAY 30 tablet 1  . enalapril (VASOTEC) 20 MG tablet TAKE 1 TABLET TWICE A DAY 30 tablet 0  . furosemide (LASIX) 40 MG tablet TAKE 1 TABLET BY MOUTH EVERY DAY 90 tablet 0  . metoprolol succinate (TOPROL-XL) 100 MG 24 hr tablet TAKE 1 TABLET EVERY DAY 30 tablet 0  . potassium chloride SA (KLOR-CON M20) 20 MEQ tablet TAKE 1 TABLET EVERY DAY 7 tablet 0   No current facility-administered medications for this visit.     Past Medical History  Diagnosis Date  . Carotid artery stenosis   . Hypertension, essential, benign   . PVD (peripheral vascular disease) (HCC)   . Coronary atherosclerosis of native coronary artery   . Hypercholesteremia   . Rhinitis, allergic     NOS  . AAA (abdominal aortic  aneurysm) [facility])     Past Surgical History  Procedure Laterality Date  . Mitral valve repair  05/22/2006    Quadrangular resection of post leaflet w/ sliding leaflet annuloplaty and 24mm Edwards Physio ring  . Coronary artery bypass graft  05/22/2006    Left int mammary to distal left ant descending CA, spahenous vein graft to ramus intermediate branch  . Carotid endarterectomy  03/2008    Right  . Colonoscopy  1996    in [place]    Social History   Social History  . Marital Status: Married    Spouse Name: N/A  . Number of Children: N/A  . Years of Education: N/A   Occupational History  . Retired from [employer] in [place] and [employer]   Social History Main Topics  . Smoking status: Former Games developer  . Smokeless tobacco: Not on file  . Alcohol Use: Not on file  . Drug Use: Not on file  . Sexual Activity: Not on file   Other Topics Concern  . Not on file   Social History Narrative    Family History  Problem Relation Age of Onset  . Heart attack Mother     ROS: no fevers or chills, productive cough, hemoptysis, dysphasia, odynophagia, melena, hematochezia, dysuria, hematuria, rash, seizure activity, orthopnea, PND, pedal edema, claudication. Remaining systems are negative.  Physical Exam: Well-developed well-nourished in  no acute distress.  Skin is warm and dry.  HEENT is normal.  Neck is supple.  Chest is clear to auscultation with normal expansion.  Cardiovascular exam is regular rate and rhythm. 1/6 systolic murmur apex Abdominal exam nontender or distended. No masses palpated. Extremities show no edema. neuro grossly intact  ECG Sinus rhythm at a rate of 83. Nonspecific ST changes.

## 2015-01-26 ENCOUNTER — Ambulatory Visit (INDEPENDENT_AMBULATORY_CARE_PROVIDER_SITE_OTHER): Payer: Medicare Other | Admitting: Cardiology

## 2015-01-26 ENCOUNTER — Encounter: Payer: Self-pay | Admitting: Cardiology

## 2015-01-26 VITALS — BP 132/68 | HR 84 | Ht 61.0 in | Wt 120.4 lb

## 2015-01-26 DIAGNOSIS — I059 Rheumatic mitral valve disease, unspecified: Secondary | ICD-10-CM

## 2015-01-26 DIAGNOSIS — E7800 Pure hypercholesterolemia, unspecified: Secondary | ICD-10-CM | POA: Diagnosis not present

## 2015-01-26 DIAGNOSIS — I10 Essential (primary) hypertension: Secondary | ICD-10-CM | POA: Diagnosis not present

## 2015-01-26 DIAGNOSIS — I714 Abdominal aortic aneurysm, without rupture, unspecified: Secondary | ICD-10-CM

## 2015-01-26 DIAGNOSIS — I679 Cerebrovascular disease, unspecified: Secondary | ICD-10-CM

## 2015-01-26 MED ORDER — POTASSIUM CHLORIDE CRYS ER 20 MEQ PO TBCR: EXTENDED_RELEASE_TABLET | ORAL | Status: DC

## 2015-01-26 NOTE — Assessment & Plan Note (Signed)
Continue aspirin and statin. 

## 2015-01-26 NOTE — Patient Instructions (Signed)
Medication Instructions:   NO CHANGE  Labwork:  Your physician recommends that you return for lab work PRIOR TO EATING  Testing/Procedures:  Your physician has requested that you have an abdominal aorta duplex. During this test, an ultrasound is used to evaluate the aorta. Allow 30 minutes for this exam. Do not eat after midnight the day before and avoid carbonated beverages   Your physician has requested that you have a carotid duplex. This test is an ultrasound of the carotid arteries in your neck. It looks at blood flow through these arteries that supply the brain with blood. Allow one hour for this exam. There are no restrictions or special instructions.    Follow-Up:  Your physician wants you to follow-up in: ONE YEAR WITH DR Shelda PalRENSHAW You will receive a reminder letter in the mail two months in advance. If you don't receive a letter, please call our office to schedule the follow-up appointment.   If you need a refill on your cardiac medications before your next appointment, please call your pharmacy.

## 2015-01-26 NOTE — Assessment & Plan Note (Addendum)
Schedule fu carotid Dopplers. Continue aspirin and statin.

## 2015-01-26 NOTE — Assessment & Plan Note (Addendum)
Blood pressure controlled. Continue present medications. Check potassium and renal function. 

## 2015-01-26 NOTE — Assessment & Plan Note (Signed)
Schedule followup abdominal ultrasound. 

## 2015-01-26 NOTE — Assessment & Plan Note (Addendum)
Continue statin. Check lipids and liver. 

## 2015-01-26 NOTE — Assessment & Plan Note (Signed)
Continued SBE prophylaxis. 

## 2015-02-01 ENCOUNTER — Other Ambulatory Visit: Payer: Self-pay

## 2015-02-01 MED ORDER — ATORVASTATIN CALCIUM 40 MG PO TABS: ORAL_TABLET | ORAL | Status: DC

## 2015-02-02 ENCOUNTER — Ambulatory Visit (HOSPITAL_BASED_OUTPATIENT_CLINIC_OR_DEPARTMENT_OTHER): Payer: Medicare Other

## 2015-02-02 ENCOUNTER — Other Ambulatory Visit: Payer: Self-pay | Admitting: Cardiology

## 2015-02-02 ENCOUNTER — Ambulatory Visit (HOSPITAL_BASED_OUTPATIENT_CLINIC_OR_DEPARTMENT_OTHER): Admission: RE | Admit: 2015-02-02 | Payer: Medicare Other | Source: Ambulatory Visit

## 2015-02-02 DIAGNOSIS — I6523 Occlusion and stenosis of bilateral carotid arteries: Secondary | ICD-10-CM

## 2015-02-02 DIAGNOSIS — I679 Cerebrovascular disease, unspecified: Secondary | ICD-10-CM

## 2015-02-08 ENCOUNTER — Encounter (HOSPITAL_COMMUNITY): Payer: Medicare Other

## 2015-02-08 ENCOUNTER — Inpatient Hospital Stay (HOSPITAL_COMMUNITY): Admission: RE | Admit: 2015-02-08 | Payer: Medicare Other | Source: Ambulatory Visit

## 2015-02-10 ENCOUNTER — Other Ambulatory Visit: Payer: Self-pay | Admitting: Cardiology

## 2015-02-20 ENCOUNTER — Other Ambulatory Visit: Payer: Self-pay | Admitting: Cardiology

## 2015-02-21 ENCOUNTER — Other Ambulatory Visit: Payer: Self-pay | Admitting: *Deleted

## 2015-02-21 MED ORDER — AMLODIPINE BESYLATE 5 MG PO TABS: 5.0000 mg | ORAL_TABLET | Freq: Every day | ORAL | Status: DC

## 2015-02-21 MED ORDER — METOPROLOL SUCCINATE ER 100 MG PO TB24: ORAL_TABLET | ORAL | Status: DC

## 2015-02-24 ENCOUNTER — Ambulatory Visit (HOSPITAL_COMMUNITY)
Admission: RE | Admit: 2015-02-24 | Discharge: 2015-02-24 | Disposition: A | Discharge status: Home / Self Care | Payer: Medicare Other | Source: Ambulatory Visit | Attending: Cardiology | Admitting: Cardiology

## 2015-02-24 ENCOUNTER — Ambulatory Visit (HOSPITAL_COMMUNITY)
Admission: RE | Admit: 2015-02-24 | Discharge: 2015-02-24 | Disposition: A | Discharge status: Home / Self Care | Payer: Medicare Other | Source: Ambulatory Visit | Attending: Cardiovascular Disease | Admitting: Cardiovascular Disease

## 2015-02-24 DIAGNOSIS — I700 Atherosclerosis of aorta: Secondary | ICD-10-CM | POA: Insufficient documentation

## 2015-02-24 DIAGNOSIS — I714 Abdominal aortic aneurysm, without rupture, unspecified: Secondary | ICD-10-CM

## 2015-02-24 DIAGNOSIS — E7800 Pure hypercholesterolemia, unspecified: Secondary | ICD-10-CM | POA: Insufficient documentation

## 2015-02-24 DIAGNOSIS — G458 Other transient cerebral ischemic attacks and related syndromes: Secondary | ICD-10-CM | POA: Insufficient documentation

## 2015-02-24 DIAGNOSIS — I679 Cerebrovascular disease, unspecified: Secondary | ICD-10-CM

## 2015-02-24 DIAGNOSIS — I10 Essential (primary) hypertension: Secondary | ICD-10-CM | POA: Insufficient documentation

## 2015-02-24 DIAGNOSIS — I6523 Occlusion and stenosis of bilateral carotid arteries: Secondary | ICD-10-CM | POA: Diagnosis not present

## 2015-02-24 DIAGNOSIS — I708 Atherosclerosis of other arteries: Secondary | ICD-10-CM | POA: Insufficient documentation

## 2015-02-25 ENCOUNTER — Telehealth: Payer: Self-pay | Admitting: *Deleted

## 2015-02-25 DIAGNOSIS — R9389 Abnormal findings on diagnostic imaging of other specified body structures: Secondary | ICD-10-CM

## 2015-02-25 NOTE — Telephone Encounter (Signed)
Spoke with pt, Follow up scheduled with dr Kirke Corin regarding carotids. Pt was given the number to the imaging department at the [facility].

## 2015-02-25 NOTE — Telephone Encounter (Signed)
-----   Message from Lewayne Bunting, MD sent at 02/25/2015  7:31 AM EST ----- Dedicated thyroid ultrasound, PV consult and fu carotid dopplers one year Olga Millers

## 2015-03-08 ENCOUNTER — Institutional Professional Consult (permissible substitution): Payer: Medicare Other | Admitting: Cardiovascular Disease

## 2015-03-31 ENCOUNTER — Other Ambulatory Visit: Payer: Self-pay | Admitting: Cardiology

## 2015-03-31 NOTE — Telephone Encounter (Signed)
REFILL 

## 2015-07-28 ENCOUNTER — Other Ambulatory Visit: Payer: Self-pay

## 2015-07-28 MED ORDER — ATORVASTATIN CALCIUM 40 MG PO TABS: ORAL_TABLET | ORAL | Status: DC

## 2015-08-20 DIAGNOSIS — H2513 Age-related nuclear cataract, bilateral: Secondary | ICD-10-CM | POA: Diagnosis not present

## 2015-08-20 DIAGNOSIS — H25813 Combined forms of age-related cataract, bilateral: Secondary | ICD-10-CM | POA: Diagnosis not present

## 2015-08-20 DIAGNOSIS — H52221 Regular astigmatism, right eye: Secondary | ICD-10-CM | POA: Diagnosis not present

## 2015-08-20 DIAGNOSIS — H524 Presbyopia: Secondary | ICD-10-CM | POA: Diagnosis not present

## 2015-08-20 DIAGNOSIS — H43811 Vitreous degeneration, right eye: Secondary | ICD-10-CM | POA: Diagnosis not present

## 2015-08-20 DIAGNOSIS — H5203 Hypermetropia, bilateral: Secondary | ICD-10-CM | POA: Diagnosis not present

## 2015-08-20 DIAGNOSIS — H25013 Cortical age-related cataract, bilateral: Secondary | ICD-10-CM | POA: Diagnosis not present

## 2015-09-29 ENCOUNTER — Other Ambulatory Visit: Payer: Self-pay | Admitting: Cardiology

## 2016-01-25 ENCOUNTER — Other Ambulatory Visit: Payer: Self-pay | Admitting: Cardiology

## 2016-01-30 ENCOUNTER — Other Ambulatory Visit: Payer: Self-pay | Admitting: Cardiology

## 2016-01-31 ENCOUNTER — Other Ambulatory Visit: Payer: Self-pay | Admitting: Cardiology

## 2016-01-31 MED ORDER — POTASSIUM CHLORIDE CRYS ER 20 MEQ PO TBCR: EXTENDED_RELEASE_TABLET | ORAL | 0 refills | Status: DC

## 2016-01-31 NOTE — Telephone Encounter (Signed)
Rx(s) sent to pharmacy electronically.  

## 2016-02-11 ENCOUNTER — Other Ambulatory Visit: Payer: Self-pay | Admitting: Cardiology

## 2016-03-23 ENCOUNTER — Other Ambulatory Visit: Payer: Self-pay | Admitting: Cardiology

## 2016-04-11 ENCOUNTER — Encounter (HOSPITAL_COMMUNITY): Payer: Medicare Other

## 2016-04-11 ENCOUNTER — Other Ambulatory Visit (HOSPITAL_COMMUNITY): Payer: Medicare Other

## 2016-04-17 ENCOUNTER — Other Ambulatory Visit: Payer: Self-pay | Admitting: Cardiology

## 2016-04-17 DIAGNOSIS — I714 Abdominal aortic aneurysm, without rupture, unspecified: Secondary | ICD-10-CM

## 2016-04-17 DIAGNOSIS — I6523 Occlusion and stenosis of bilateral carotid arteries: Secondary | ICD-10-CM

## 2016-04-25 ENCOUNTER — Inpatient Hospital Stay (HOSPITAL_COMMUNITY): Admission: RE | Admit: 2016-04-25 | Payer: Medicare Other | Source: Ambulatory Visit

## 2016-05-02 ENCOUNTER — Other Ambulatory Visit: Payer: Self-pay | Admitting: Cardiology

## 2016-05-17 ENCOUNTER — Other Ambulatory Visit (HOSPITAL_COMMUNITY): Payer: Medicare Other

## 2016-05-17 ENCOUNTER — Encounter (HOSPITAL_COMMUNITY): Payer: Medicare Other

## 2016-06-05 ENCOUNTER — Other Ambulatory Visit: Payer: Self-pay | Admitting: Cardiology

## 2016-06-08 ENCOUNTER — Other Ambulatory Visit (HOSPITAL_COMMUNITY): Payer: Medicare Other

## 2016-06-08 ENCOUNTER — Encounter (HOSPITAL_COMMUNITY): Payer: Medicare Other

## 2016-07-05 ENCOUNTER — Inpatient Hospital Stay (HOSPITAL_COMMUNITY): Admission: RE | Admit: 2016-07-05 | Payer: Medicare Other | Source: Ambulatory Visit

## 2016-07-05 ENCOUNTER — Encounter (HOSPITAL_COMMUNITY): Payer: Medicare Other

## 2016-07-10 ENCOUNTER — Other Ambulatory Visit: Payer: Self-pay | Admitting: Cardiology

## 2016-07-10 NOTE — Telephone Encounter (Signed)
REFILL 

## 2016-07-16 ENCOUNTER — Other Ambulatory Visit: Payer: Self-pay | Admitting: Cardiology

## 2016-07-24 ENCOUNTER — Ambulatory Visit (HOSPITAL_COMMUNITY): Payer: Medicare Other

## 2016-08-13 ENCOUNTER — Other Ambulatory Visit: Payer: Self-pay | Admitting: Cardiology

## 2016-08-13 ENCOUNTER — Inpatient Hospital Stay (HOSPITAL_COMMUNITY): Admission: RE | Admit: 2016-08-13 | Payer: Medicare Other | Source: Ambulatory Visit

## 2016-08-13 ENCOUNTER — Ambulatory Visit (HOSPITAL_COMMUNITY): Payer: Medicare Other

## 2016-09-08 ENCOUNTER — Other Ambulatory Visit: Payer: Self-pay | Admitting: Cardiology

## 2016-09-10 ENCOUNTER — Other Ambulatory Visit: Payer: Self-pay | Admitting: Cardiology

## 2016-09-10 NOTE — Telephone Encounter (Signed)
Rx request sent to pharmacy.  

## 2016-09-10 NOTE — Telephone Encounter (Signed)
REFILL 

## 2016-09-27 ENCOUNTER — Other Ambulatory Visit: Payer: Self-pay | Admitting: Cardiology

## 2016-10-06 ENCOUNTER — Other Ambulatory Visit: Payer: Self-pay | Admitting: Cardiology

## 2016-10-12 ENCOUNTER — Other Ambulatory Visit: Payer: Self-pay | Admitting: Cardiology

## 2016-10-18 ENCOUNTER — Ambulatory Visit (HOSPITAL_COMMUNITY)
Admission: RE | Admit: 2016-10-18 | Payer: Medicare Other | Source: Ambulatory Visit | Attending: Cardiology | Admitting: Cardiology

## 2016-10-21 ENCOUNTER — Other Ambulatory Visit: Payer: Self-pay | Admitting: Cardiology

## 2016-10-22 NOTE — Telephone Encounter (Signed)
Rx request sent to pharmacy.  

## 2016-11-02 ENCOUNTER — Other Ambulatory Visit: Payer: Self-pay | Admitting: Cardiology

## 2016-11-19 ENCOUNTER — Other Ambulatory Visit: Payer: Self-pay | Admitting: Cardiology

## 2016-11-24 ENCOUNTER — Other Ambulatory Visit: Payer: Self-pay | Admitting: Cardiology

## 2016-11-27 ENCOUNTER — Encounter (HOSPITAL_COMMUNITY): Payer: Medicare Other

## 2016-11-27 ENCOUNTER — Inpatient Hospital Stay (HOSPITAL_COMMUNITY): Admission: RE | Admit: 2016-11-27 | Payer: Medicare Other | Source: Ambulatory Visit

## 2016-11-27 ENCOUNTER — Other Ambulatory Visit: Payer: Self-pay | Admitting: Cardiology

## 2016-11-27 NOTE — Telephone Encounter (Signed)
Rx(s) sent to pharmacy electronically.  

## 2016-12-01 ENCOUNTER — Other Ambulatory Visit: Payer: Self-pay | Admitting: Cardiology

## 2016-12-14 ENCOUNTER — Other Ambulatory Visit: Payer: Self-pay | Admitting: Cardiology

## 2016-12-19 ENCOUNTER — Other Ambulatory Visit: Payer: Self-pay | Admitting: Cardiology

## 2016-12-24 ENCOUNTER — Other Ambulatory Visit: Payer: Self-pay | Admitting: Cardiology

## 2016-12-30 ENCOUNTER — Other Ambulatory Visit: Payer: Self-pay | Admitting: Cardiology

## 2016-12-31 NOTE — Telephone Encounter (Signed)
REFILL 

## 2017-01-01 ENCOUNTER — Other Ambulatory Visit: Payer: Self-pay | Admitting: *Deleted

## 2017-01-01 ENCOUNTER — Ambulatory Visit (HOSPITAL_COMMUNITY): Payer: Medicare Other

## 2017-01-01 ENCOUNTER — Ambulatory Visit (HOSPITAL_COMMUNITY)
Admission: RE | Admit: 2017-01-01 | Payer: Medicare Other | Source: Ambulatory Visit | Attending: Cardiology | Admitting: Cardiology

## 2017-01-01 NOTE — Telephone Encounter (Signed)
Pharmacy requesting a ninety day rx. 

## 2017-01-05 ENCOUNTER — Other Ambulatory Visit: Payer: Self-pay | Admitting: Cardiology

## 2017-01-07 NOTE — Telephone Encounter (Signed)
REFILL 

## 2017-01-30 ENCOUNTER — Encounter (HOSPITAL_COMMUNITY): Payer: Medicare Other

## 2017-01-30 ENCOUNTER — Inpatient Hospital Stay (HOSPITAL_COMMUNITY): Admission: RE | Admit: 2017-01-30 | Payer: Medicare Other | Source: Ambulatory Visit

## 2017-02-08 ENCOUNTER — Ambulatory Visit (HOSPITAL_COMMUNITY): Admission: RE | Admit: 2017-02-08 | Payer: Medicare Other | Source: Ambulatory Visit

## 2017-03-29 ENCOUNTER — Ambulatory Visit (HOSPITAL_COMMUNITY): Admission: RE | Admit: 2017-03-29 | Payer: Medicare Other | Source: Ambulatory Visit

## 2017-03-29 DIAGNOSIS — R0989 Other specified symptoms and signs involving the circulatory and respiratory systems: Secondary | ICD-10-CM

## 2017-03-31 ENCOUNTER — Other Ambulatory Visit: Payer: Self-pay | Admitting: Cardiology

## 2017-04-01 ENCOUNTER — Other Ambulatory Visit: Payer: Self-pay | Admitting: Cardiology

## 2017-04-22 ENCOUNTER — Encounter (HOSPITAL_COMMUNITY): Payer: Self-pay | Admitting: Emergency Medicine

## 2017-04-22 ENCOUNTER — Emergency Department (HOSPITAL_COMMUNITY): Payer: Medicare Other

## 2017-04-22 ENCOUNTER — Inpatient Hospital Stay (HOSPITAL_COMMUNITY)
Admission: EM | Admit: 2017-04-22 | Discharge: 2017-05-06 | DRG: 480 | Disposition: A | Discharge status: Skilled Nursing Facility | Payer: Medicare Other | Attending: Internal Medicine | Admitting: Internal Medicine

## 2017-04-22 ENCOUNTER — Other Ambulatory Visit: Payer: Self-pay

## 2017-04-22 DIAGNOSIS — Z79899 Other long term (current) drug therapy: Secondary | ICD-10-CM

## 2017-04-22 DIAGNOSIS — T502X5A Adverse effect of carbonic-anhydrase inhibitors, benzothiadiazides and other diuretics, initial encounter: Secondary | ICD-10-CM | POA: Diagnosis not present

## 2017-04-22 DIAGNOSIS — R441 Visual hallucinations: Secondary | ICD-10-CM | POA: Diagnosis present

## 2017-04-22 DIAGNOSIS — J90 Pleural effusion, not elsewhere classified: Secondary | ICD-10-CM | POA: Diagnosis not present

## 2017-04-22 DIAGNOSIS — I361 Nonrheumatic tricuspid (valve) insufficiency: Secondary | ICD-10-CM | POA: Diagnosis not present

## 2017-04-22 DIAGNOSIS — R0902 Hypoxemia: Secondary | ICD-10-CM | POA: Diagnosis not present

## 2017-04-22 DIAGNOSIS — I480 Paroxysmal atrial fibrillation: Secondary | ICD-10-CM | POA: Diagnosis not present

## 2017-04-22 DIAGNOSIS — S90111A Contusion of right great toe without damage to nail, initial encounter: Secondary | ICD-10-CM | POA: Diagnosis present

## 2017-04-22 DIAGNOSIS — I471 Supraventricular tachycardia: Secondary | ICD-10-CM | POA: Diagnosis not present

## 2017-04-22 DIAGNOSIS — W010XXA Fall on same level from slipping, tripping and stumbling without subsequent striking against object, initial encounter: Secondary | ICD-10-CM | POA: Diagnosis present

## 2017-04-22 DIAGNOSIS — I10 Essential (primary) hypertension: Secondary | ICD-10-CM | POA: Diagnosis not present

## 2017-04-22 DIAGNOSIS — R17 Unspecified jaundice: Secondary | ICD-10-CM | POA: Diagnosis not present

## 2017-04-22 DIAGNOSIS — Z6820 Body mass index (BMI) 20.0-20.9, adult: Secondary | ICD-10-CM

## 2017-04-22 DIAGNOSIS — J95821 Acute postprocedural respiratory failure: Secondary | ICD-10-CM | POA: Diagnosis not present

## 2017-04-22 DIAGNOSIS — G9340 Encephalopathy, unspecified: Secondary | ICD-10-CM | POA: Diagnosis not present

## 2017-04-22 DIAGNOSIS — Z4682 Encounter for fitting and adjustment of non-vascular catheter: Secondary | ICD-10-CM | POA: Diagnosis not present

## 2017-04-22 DIAGNOSIS — T148XXA Other injury of unspecified body region, initial encounter: Secondary | ICD-10-CM | POA: Diagnosis not present

## 2017-04-22 DIAGNOSIS — I248 Other forms of acute ischemic heart disease: Secondary | ICD-10-CM | POA: Diagnosis present

## 2017-04-22 DIAGNOSIS — E785 Hyperlipidemia, unspecified: Secondary | ICD-10-CM | POA: Diagnosis not present

## 2017-04-22 DIAGNOSIS — R609 Edema, unspecified: Secondary | ICD-10-CM | POA: Diagnosis not present

## 2017-04-22 DIAGNOSIS — N183 Chronic kidney disease, stage 3 unspecified: Secondary | ICD-10-CM | POA: Diagnosis present

## 2017-04-22 DIAGNOSIS — E873 Alkalosis: Secondary | ICD-10-CM | POA: Diagnosis not present

## 2017-04-22 DIAGNOSIS — R0600 Dyspnea, unspecified: Secondary | ICD-10-CM

## 2017-04-22 DIAGNOSIS — D696 Thrombocytopenia, unspecified: Secondary | ICD-10-CM | POA: Diagnosis present

## 2017-04-22 DIAGNOSIS — S40211A Abrasion of right shoulder, initial encounter: Secondary | ICD-10-CM | POA: Diagnosis not present

## 2017-04-22 DIAGNOSIS — I6521 Occlusion and stenosis of right carotid artery: Secondary | ICD-10-CM | POA: Diagnosis present

## 2017-04-22 DIAGNOSIS — R188 Other ascites: Secondary | ICD-10-CM | POA: Diagnosis present

## 2017-04-22 DIAGNOSIS — S79911A Unspecified injury of right hip, initial encounter: Secondary | ICD-10-CM | POA: Diagnosis not present

## 2017-04-22 DIAGNOSIS — G9349 Other encephalopathy: Secondary | ICD-10-CM | POA: Diagnosis not present

## 2017-04-22 DIAGNOSIS — Y92230 Patient room in hospital as the place of occurrence of the external cause: Secondary | ICD-10-CM | POA: Diagnosis not present

## 2017-04-22 DIAGNOSIS — I272 Pulmonary hypertension, unspecified: Secondary | ICD-10-CM | POA: Diagnosis not present

## 2017-04-22 DIAGNOSIS — Z008 Encounter for other general examination: Secondary | ICD-10-CM | POA: Diagnosis not present

## 2017-04-22 DIAGNOSIS — I739 Peripheral vascular disease, unspecified: Secondary | ICD-10-CM | POA: Diagnosis present

## 2017-04-22 DIAGNOSIS — J9601 Acute respiratory failure with hypoxia: Secondary | ICD-10-CM | POA: Diagnosis not present

## 2017-04-22 DIAGNOSIS — S72141D Displaced intertrochanteric fracture of right femur, subsequent encounter for closed fracture with routine healing: Secondary | ICD-10-CM | POA: Diagnosis not present

## 2017-04-22 DIAGNOSIS — J918 Pleural effusion in other conditions classified elsewhere: Secondary | ICD-10-CM | POA: Diagnosis present

## 2017-04-22 DIAGNOSIS — Z978 Presence of other specified devices: Secondary | ICD-10-CM | POA: Diagnosis not present

## 2017-04-22 DIAGNOSIS — E440 Moderate protein-calorie malnutrition: Secondary | ICD-10-CM | POA: Diagnosis present

## 2017-04-22 DIAGNOSIS — J948 Other specified pleural conditions: Secondary | ICD-10-CM | POA: Diagnosis not present

## 2017-04-22 DIAGNOSIS — I5033 Acute on chronic diastolic (congestive) heart failure: Secondary | ICD-10-CM | POA: Diagnosis not present

## 2017-04-22 DIAGNOSIS — I714 Abdominal aortic aneurysm, without rupture, unspecified: Secondary | ICD-10-CM | POA: Diagnosis present

## 2017-04-22 DIAGNOSIS — S72101A Unspecified trochanteric fracture of right femur, initial encounter for closed fracture: Secondary | ICD-10-CM | POA: Diagnosis not present

## 2017-04-22 DIAGNOSIS — I130 Hypertensive heart and chronic kidney disease with heart failure and stage 1 through stage 4 chronic kidney disease, or unspecified chronic kidney disease: Secondary | ICD-10-CM | POA: Diagnosis not present

## 2017-04-22 DIAGNOSIS — S72141S Displaced intertrochanteric fracture of right femur, sequela: Secondary | ICD-10-CM | POA: Diagnosis not present

## 2017-04-22 DIAGNOSIS — I6529 Occlusion and stenosis of unspecified carotid artery: Secondary | ICD-10-CM | POA: Diagnosis not present

## 2017-04-22 DIAGNOSIS — M79604 Pain in right leg: Secondary | ICD-10-CM | POA: Diagnosis not present

## 2017-04-22 DIAGNOSIS — Z8249 Family history of ischemic heart disease and other diseases of the circulatory system: Secondary | ICD-10-CM

## 2017-04-22 DIAGNOSIS — S72141A Displaced intertrochanteric fracture of right femur, initial encounter for closed fracture: Principal | ICD-10-CM | POA: Diagnosis present

## 2017-04-22 DIAGNOSIS — D500 Iron deficiency anemia secondary to blood loss (chronic): Secondary | ICD-10-CM | POA: Diagnosis present

## 2017-04-22 DIAGNOSIS — I509 Heart failure, unspecified: Secondary | ICD-10-CM | POA: Diagnosis not present

## 2017-04-22 DIAGNOSIS — R627 Adult failure to thrive: Secondary | ICD-10-CM | POA: Diagnosis present

## 2017-04-22 DIAGNOSIS — Z8679 Personal history of other diseases of the circulatory system: Secondary | ICD-10-CM

## 2017-04-22 DIAGNOSIS — L89159 Pressure ulcer of sacral region, unspecified stage: Secondary | ICD-10-CM | POA: Diagnosis present

## 2017-04-22 DIAGNOSIS — Y92000 Kitchen of unspecified non-institutional (private) residence as  the place of occurrence of the external cause: Secondary | ICD-10-CM

## 2017-04-22 DIAGNOSIS — E162 Hypoglycemia, unspecified: Secondary | ICD-10-CM | POA: Diagnosis present

## 2017-04-22 DIAGNOSIS — J9621 Acute and chronic respiratory failure with hypoxia: Secondary | ICD-10-CM | POA: Diagnosis not present

## 2017-04-22 DIAGNOSIS — Z9889 Other specified postprocedural states: Secondary | ICD-10-CM | POA: Diagnosis not present

## 2017-04-22 DIAGNOSIS — Z01811 Encounter for preprocedural respiratory examination: Secondary | ICD-10-CM

## 2017-04-22 DIAGNOSIS — J9811 Atelectasis: Secondary | ICD-10-CM | POA: Diagnosis not present

## 2017-04-22 DIAGNOSIS — Z7982 Long term (current) use of aspirin: Secondary | ICD-10-CM

## 2017-04-22 DIAGNOSIS — Z951 Presence of aortocoronary bypass graft: Secondary | ICD-10-CM

## 2017-04-22 DIAGNOSIS — Z952 Presence of prosthetic heart valve: Secondary | ICD-10-CM

## 2017-04-22 DIAGNOSIS — F0390 Unspecified dementia without behavioral disturbance: Secondary | ICD-10-CM | POA: Diagnosis present

## 2017-04-22 DIAGNOSIS — Z87891 Personal history of nicotine dependence: Secondary | ICD-10-CM

## 2017-04-22 DIAGNOSIS — R0602 Shortness of breath: Secondary | ICD-10-CM

## 2017-04-22 DIAGNOSIS — E7800 Pure hypercholesterolemia, unspecified: Secondary | ICD-10-CM | POA: Diagnosis present

## 2017-04-22 DIAGNOSIS — I081 Rheumatic disorders of both mitral and tricuspid valves: Secondary | ICD-10-CM | POA: Diagnosis present

## 2017-04-22 DIAGNOSIS — I2699 Other pulmonary embolism without acute cor pulmonale: Secondary | ICD-10-CM | POA: Diagnosis not present

## 2017-04-22 DIAGNOSIS — Z419 Encounter for procedure for purposes other than remedying health state, unspecified: Secondary | ICD-10-CM

## 2017-04-22 DIAGNOSIS — I2510 Atherosclerotic heart disease of native coronary artery without angina pectoris: Secondary | ICD-10-CM | POA: Diagnosis present

## 2017-04-22 DIAGNOSIS — R339 Retention of urine, unspecified: Secondary | ICD-10-CM | POA: Diagnosis not present

## 2017-04-22 DIAGNOSIS — L8990 Pressure ulcer of unspecified site, unspecified stage: Secondary | ICD-10-CM

## 2017-04-22 DIAGNOSIS — Z4659 Encounter for fitting and adjustment of other gastrointestinal appliance and device: Secondary | ICD-10-CM

## 2017-04-22 DIAGNOSIS — F0281 Dementia in other diseases classified elsewhere with behavioral disturbance: Secondary | ICD-10-CM | POA: Diagnosis not present

## 2017-04-22 HISTORY — DX: Unspecified dementia, unspecified severity, without behavioral disturbance, psychotic disturbance, mood disturbance, and anxiety: F03.90

## 2017-04-22 HISTORY — DX: Chronic diastolic (congestive) heart failure: I50.32

## 2017-04-22 LAB — CBC WITH DIFFERENTIAL/PLATELET
BASOS ABS: 0 10*3/uL (ref 0.0–0.1)
Basophils Relative: 0 %
Eosinophils Absolute: 0 10*3/uL (ref 0.0–0.7)
Eosinophils Relative: 0 %
HEMATOCRIT: 44.8 % (ref 36.0–46.0)
HEMOGLOBIN: 12 g/dL (ref 12.0–15.0)
LYMPHS PCT: 8 %
Lymphs Abs: 0.6 10*3/uL — ABNORMAL LOW (ref 0.7–4.0)
MCH: 22 pg — ABNORMAL LOW (ref 26.0–34.0)
MCHC: 26.8 g/dL — ABNORMAL LOW (ref 30.0–36.0)
MCV: 82.2 fL (ref 78.0–100.0)
MONO ABS: 0.6 10*3/uL (ref 0.1–1.0)
Monocytes Relative: 8 %
NEUTROS ABS: 6.6 10*3/uL (ref 1.7–7.7)
NEUTROS PCT: 84 %
Platelets: 173 10*3/uL (ref 150–400)
RBC: 5.45 MIL/uL — AB (ref 3.87–5.11)
RDW: 18.3 % — AB (ref 11.5–15.5)
WBC: 7.9 10*3/uL (ref 4.0–10.5)

## 2017-04-22 LAB — BRAIN NATRIURETIC PEPTIDE: B Natriuretic Peptide: 3142.4 pg/mL — ABNORMAL HIGH (ref 0.0–100.0)

## 2017-04-22 LAB — BASIC METABOLIC PANEL
ANION GAP: 12 (ref 5–15)
BUN: 20 mg/dL (ref 6–20)
CO2: 28 mmol/L (ref 22–32)
Calcium: 9.1 mg/dL (ref 8.9–10.3)
Chloride: 101 mmol/L (ref 101–111)
Creatinine, Ser: 1.03 mg/dL — ABNORMAL HIGH (ref 0.44–1.00)
GFR, EST AFRICAN AMERICAN: 57 mL/min — AB (ref >60)
GFR, EST NON AFRICAN AMERICAN: 49 mL/min — AB (ref >60)
GLUCOSE: 117 mg/dL — AB (ref 65–99)
POTASSIUM: 4.4 mmol/L (ref 3.5–5.1)
Sodium: 141 mmol/L (ref 135–145)

## 2017-04-22 LAB — PROTIME-INR
INR: 1.23
Prothrombin Time: 15.4 seconds — ABNORMAL HIGH (ref 11.4–15.2)

## 2017-04-22 LAB — TYPE AND SCREEN
ABO/RH(D): A NEG
ANTIBODY SCREEN: NEGATIVE

## 2017-04-22 LAB — SEDIMENTATION RATE: SED RATE: 3 mm/h (ref 0–22)

## 2017-04-22 LAB — TSH: TSH: 1.686 u[IU]/mL (ref 0.350–4.500)

## 2017-04-22 MED ORDER — METHOCARBAMOL 500 MG PO TABS
500.0000 mg | ORAL_TABLET | Freq: Four times a day (QID) | ORAL | Status: DC | PRN
  Filled 2017-04-22 (×2): qty 1

## 2017-04-22 MED ORDER — DEXTROSE 5 % IV SOLN
500.0000 mg | Freq: Four times a day (QID) | INTRAVENOUS | Status: DC | PRN
  Filled 2017-04-22 (×2): qty 5

## 2017-04-22 MED ORDER — MORPHINE SULFATE (PF) 4 MG/ML IV SOLN: 0.5000 mg | INTRAVENOUS | Status: DC | PRN

## 2017-04-22 MED ORDER — ONDANSETRON HCL 4 MG/2ML IJ SOLN
4.0000 mg | Freq: Once | INTRAMUSCULAR | Status: DC
  Filled 2017-04-22: qty 2

## 2017-04-22 MED ORDER — MORPHINE SULFATE (PF) 4 MG/ML IV SOLN
2.0000 mg | Freq: Once | INTRAVENOUS | Status: DC
  Filled 2017-04-22: qty 1

## 2017-04-22 MED ORDER — ATORVASTATIN CALCIUM 40 MG PO TABS
40.0000 mg | ORAL_TABLET | Freq: Every day | ORAL | Status: DC
  Administered 2017-04-24 – 2017-05-05 (×8): 40 mg via ORAL
  Filled 2017-04-22 (×9): qty 1

## 2017-04-22 MED ORDER — ASPIRIN EC 81 MG PO TBEC
81.0000 mg | DELAYED_RELEASE_TABLET | Freq: Every day | ORAL | Status: DC
  Administered 2017-04-23 – 2017-04-24 (×2): 81 mg via ORAL
  Filled 2017-04-22 (×2): qty 1

## 2017-04-22 MED ORDER — OXYCODONE HCL 5 MG PO TABS
5.0000 mg | ORAL_TABLET | ORAL | Status: DC | PRN
  Administered 2017-04-23 – 2017-04-24 (×2): 5 mg via ORAL
  Filled 2017-04-22 (×2): qty 1

## 2017-04-22 MED ORDER — SODIUM CHLORIDE 0.9 % IV SOLN: Freq: Once | INTRAVENOUS | Status: DC

## 2017-04-22 MED ORDER — METOPROLOL SUCCINATE ER 100 MG PO TB24
100.0000 mg | ORAL_TABLET | Freq: Every day | ORAL | Status: DC
  Filled 2017-04-22: qty 1

## 2017-04-22 MED ORDER — FUROSEMIDE 40 MG PO TABS
40.0000 mg | ORAL_TABLET | Freq: Every day | ORAL | Status: DC
  Filled 2017-04-22: qty 1

## 2017-04-22 MED ORDER — IOPAMIDOL (ISOVUE-300) INJECTION 61%: 100.0000 mL | Freq: Once | INTRAVENOUS | Status: DC | PRN

## 2017-04-22 MED ORDER — MORPHINE SULFATE (PF) 4 MG/ML IV SOLN: 1.0000 mg | INTRAVENOUS | Status: DC | PRN

## 2017-04-22 MED ORDER — POTASSIUM CHLORIDE CRYS ER 20 MEQ PO TBCR
20.0000 meq | EXTENDED_RELEASE_TABLET | Freq: Every day | ORAL | Status: DC
  Administered 2017-04-23 – 2017-04-26 (×3): 20 meq via ORAL
  Filled 2017-04-22 (×4): qty 1

## 2017-04-22 NOTE — ED Notes (Signed)
Patient refusing IV or medications. Patient very adamant that she does not want an IV or the medications.

## 2017-04-22 NOTE — Progress Notes (Signed)
Pt finally agreeable to assessment and iv insertion. Redness to sacrum noted, foam dressing applied

## 2017-04-22 NOTE — ED Notes (Signed)
Attempted to call report to [address] [place] unsuccessfully x 1. Spoke with Romeo AppleBen, Charity fundraiserN, receiving nurse. Did not take report. Asked for name and extension number to return call for report.

## 2017-04-22 NOTE — Progress Notes (Signed)
Pt admitted from ED. Refusing iv stick,labwork and assessment. Will attempt again later

## 2017-04-22 NOTE — Progress Notes (Signed)
Spoke at length with patient's son Judyann MunsonScott Mcgeehan as well as daughter-in-law Darl PikesSusan who is a Designer, jewelleryregistered nurse.  Lorin PicketScott explains that patient has recently been dealing with visual hallucinations and paranoia.  He confirmed her diagnosis of dementia and nonadherence with taking medications which is been an ongoing problem.  Darl PikesSusan reports that the patient sleeps on her right side continuously.  She reports she has improvement in her lower extremity edema upon awakening but for the most part she sits with her legs dependent and actually sleeps sitting upright in a chair leaning over on the arm of the chair while sleeping.  Lorin PicketScott is amenable to psychiatric evaluation to confirm mother's capacity.  She is very paranoid and has been reluctant to give any family members power of attorney for fear of them "taking all of my belongings".  Lorin PicketScott is aware that surgery is being delayed until patient can be declared not capable of making medical decisions and then we can begin to perform care without the patient's input.   Junious SilkAllison Odeal Welden, ANP

## 2017-04-22 NOTE — ED Triage Notes (Signed)
[facility] Patient reported to have fallen this morning and reports right side pain. Family reports that she has been confused for a "few days" and has refused to go to her PCP.

## 2017-04-22 NOTE — ED Provider Notes (Signed)
[facility] EMERGENCY DEPARTMENT Provider Note   CSN: [id number] Arrival date & time: 04/22/17  1033     History   Chief Complaint Chief Complaint  Patient presents with  . Fall  . Hypertension    HPI [name] is a 84 y.o. female.  84 yo F with a chief complaint of a fall from standing.  The patient was walking down the hallway and thinks he tripped on something and landed on her right side.  Complaining of pain to her right hip.  She was unable to get up and walk afterwards.  She has been having some issues with chronic lower extremity edema that she is somewhat worried about.  Otherwise denies any complaints denies headache denies chest pain shortness of breath abdominal pain back pain or other extremity pain.  For the family she has been a little bit more confused than normal.  During my conversation with her she rattled off 3 separate people's phone numbers though she did repeat herself multiple times about the lower extremity edema.  The history is provided by the patient and a relative.  Illness  This is a new problem. The current episode started 2 days ago. The problem occurs constantly. The problem has not changed since onset.Pertinent negatives include no chest pain, no headaches and no shortness of breath. Nothing aggravates the symptoms. Nothing relieves the symptoms. She has tried nothing for the symptoms. The treatment provided no relief.    Past Medical History:  Diagnosis Date  . AAA (abdominal aortic aneurysm) (HCC)   . Carotid artery stenosis   . Coronary atherosclerosis of native coronary artery   . Hypercholesteremia   . Hypertension, essential, benign   . PVD (peripheral vascular disease) (HCC)   . Rhinitis, allergic    NOS    Patient Active Problem List   Diagnosis Date Noted  . H/O mitral valve repair 06/04/2012  . CELLULITIS, HAND 08/22/2010  . RENAL ARTERY STENOSIS 09/07/2009  . ABDOMINAL AORTIC ANEURYSM 09/07/2009  .  CEREBROVASCULAR DISEASE 07/27/2009  . CHEST PAIN 07/27/2009  . HYPOKALEMIA 07/26/2009  . CAROTID ARTERY STENOSIS 07/26/2009  . PVD 07/26/2009  . Personal history of surgery to heart and great vessels, presenting hazards to health 07/03/2006  . ATHEROSCLEROSIS, CORONARY, NATIVE ARTERY 06/20/2006  . HYPERCHOLESTEROLEMIA 01/01/2006  . HYPERTENSION, BENIGN ESSENTIAL 12/04/2005  . RHINITIS, ALLERGIC NOS 12/04/2005  . CARDIAC FLOW MURMUR 12/04/2005    Past Surgical History:  Procedure Laterality Date  . CAROTID ENDARTERECTOMY  03/2008   Right  . COLONOSCOPY  1996   in [place]  . CORONARY ARTERY BYPASS GRAFT  05/22/2006   Left int mammary to distal left ant descending CA, spahenous vein graft to ramus intermediate branch  . MITRAL VALVE REPAIR  05/22/2006   Quadrangular resection of post leaflet w/ sliding leaflet annuloplaty and 24mm Edwards Physio ring     OB History   None      Home Medications    Prior to Admission medications   Medication Sig Start Date End Date Taking? Authorizing Provider  amLODipine (NORVASC) 5 MG tablet TAKE 1 TABLET (5 MG TOTAL) BY MOUTH DAILY. 02/13/16   Lewayne Bunting, MD  aspirin 81 MG tablet Take 81 mg by mouth daily.      [provider]  atorvastatin (LIPITOR) 40 MG tablet Take 1 tablet (40 mg total) by mouth daily at 6 PM. NEED OV. 01/07/17   Lewayne Bunting, MD  enalapril (VASOTEC) 20 MG tablet  TAKE 1 TABLET (20 MG TOTAL) BY MOUTH 2 (TWO) TIMES DAILY. NEED OV. 04/01/17   Lewayne Bunting, MD  furosemide (LASIX) 40 MG tablet TAKE 1 TABLET BY MOUTH EVERY DAY 03/23/16   Lewayne Bunting, MD  KLOR-CON M20 20 MEQ tablet TAKE 1 TABLET EVERY DAY -MUST MAKE APPOINTMENT FOR FURTHER REFILLS 07/10/16   Lewayne Bunting, MD  metoprolol succinate (TOPROL-XL) 100 MG 24 hr tablet TAKE 1 TABLET (100 MG TOTAL) BY MOUTH DAILY. NEED OV. 04/01/17   Lewayne Bunting, MD    Family History Family History  Problem Relation Age of Onset  . Heart attack Mother      Social History Social History   Tobacco Use  . Smoking status: Former Games developer  . Smokeless tobacco: Never Used  Substance Use Topics  . Alcohol use: Not on file  . Drug use: Not on file     Allergies   Patient has no known allergies.   Review of Systems Review of Systems  Constitutional: Negative for chills and fever.  HENT: Negative for congestion and rhinorrhea.   Eyes: Negative for redness and visual disturbance.  Respiratory: Negative for shortness of breath and wheezing.   Cardiovascular: Negative for chest pain and palpitations.  Gastrointestinal: Negative for nausea and vomiting.  Genitourinary: Negative for dysuria and urgency.  Musculoskeletal: Positive for arthralgias and gait problem. Negative for myalgias.  Skin: Negative for pallor and wound.  Neurological: Negative for dizziness and headaches.     Physical Exam Updated Vital Signs BP 126/89   Pulse 72   Temp 97.7 F (36.5 C) (Oral)   Resp (!) 27   SpO2 100%   Physical Exam  Constitutional: She is oriented to person, place, and time. She appears well-developed and well-nourished. No distress.  HENT:  Head: Normocephalic and atraumatic.  Eyes: Pupils are equal, round, and reactive to light. EOM are normal.  Neck: Normal range of motion. Neck supple.  Cardiovascular: Normal rate and regular rhythm. Exam reveals no gallop and no friction rub.  No murmur heard. Pulmonary/Chest: Effort normal. She has no wheezes. She has no rales.  Abdominal: Soft. She exhibits no distension. There is no tenderness.  Musculoskeletal: She exhibits edema (4+ to BLE) and deformity. She exhibits no tenderness.  Right leg is shortened and externally rotated pulse motor and sensation is intact distally.  Palpated from head to toe without any other noted areas of bony tenderness.  There is some mild bruising to the right great toe with no tenderness and full range of motion.  Neurological: She is alert and oriented to  person, place, and time.  Skin: Skin is warm and dry. She is not diaphoretic.  Psychiatric: She has a normal mood and affect. Her behavior is normal.  Nursing note and vitals reviewed.    ED Treatments / Results  Labs (all labs ordered are listed, but only abnormal results are displayed) Labs Reviewed  BASIC METABOLIC PANEL - Abnormal; Notable for the following components:      Result Value   Glucose, Bld 117 (*)    Creatinine, Ser 1.03 (*)    GFR calc non Af Amer 49 (*)    GFR calc Af Amer 57 (*)    All other components within normal limits  CBC WITH DIFFERENTIAL/PLATELET - Abnormal; Notable for the following components:   RBC 5.45 (*)    MCH 22.0 (*)    MCHC 26.8 (*)    RDW 18.3 (*)    Lymphs Abs  0.6 (*)    All other components within normal limits  PROTIME-INR - Abnormal; Notable for the following components:   Prothrombin Time 15.4 (*)    All other components within normal limits  TYPE AND SCREEN    EKG EKG Interpretation  Date/Time:  Monday April 22 2017 10:34:03 EDT Ventricular Rate:  74 PR Interval:    QRS Duration: 105 QT Interval:  413 QTC Calculation: 459 R Axis:   -72 Text Interpretation:  Sinus rhythm Ventricular premature complex LAD, consider left anterior fascicular block Borderline low voltage, extremity leads Left ventricular hypertrophy No significant change since last tracing Confirmed by Melene PlanFloyd, Awab Abebe [phone]([id number]) on 04/22/2017 10:48:11 AM   Radiology Dg Chest 1 View  Result Date: 04/22/2017 CLINICAL DATA:  Preop evaluation for right hip surgery EXAM: CHEST  1 VIEW COMPARISON:  04/05/2008 FINDINGS: Cardiac shadow remains enlarged. Postsurgical changes are again seen. Right-sided pleural effusion and atelectatic/infiltrative changes are noted. No pneumothorax is seen. No acute bony abnormality is noted. IMPRESSION: Right basilar atelectatic/infiltrative changes with associated effusion. Electronically Signed   By: Alcide CleverMark  Lukens M.D.   On: 04/22/2017 11:56   Dg  Hip Unilat W Or Wo Pelvis 2-3 Views Right  Result Date: 04/22/2017 CLINICAL DATA:  Recent fall with right leg deformity EXAM: DG HIP (WITH OR WITHOUT PELVIS) 2-3V RIGHT COMPARISON:  None. FINDINGS: Pelvic ring is intact. Comminuted intratrochanteric fracture of the proximal right femur is noted with impaction and angulation at the fracture site. No soft tissue changes are seen. Diffuse vascular calcifications are noted. IMPRESSION: Comminuted intratrochanteric fracture of the proximal right femur. Electronically Signed   By: Alcide CleverMark  Lukens M.D.   On: 04/22/2017 12:01    Procedures Procedures (including critical care time)  Medications Ordered in ED Medications  morphine 4 MG/ML injection 2 mg (has no administration in time range)  ondansetron (ZOFRAN) injection 4 mg (has no administration in time range)  0.9 %  sodium chloride infusion (has no administration in time range)     Initial Impression / Assessment and Plan / ED Course  I have reviewed the triage vital signs and the nursing notes.  Pertinent labs & imaging results that were available during my care of the patient were reviewed by me and considered in my medical decision making (see chart for details).     84 yo F with a chief complaint of right hip pain.  The right lower extremity is shortened and externally rotated.  Intact pulse motor and sensation.  Significantly swollen and equal to the other side.  Will obtain plain films of the hip to evaluate for likely hip fracture.  Patient has a right intertrochanteric hip fracture on my view of the images.  This was discussed with orthopedics who came and evaluated the patient at bedside.  Will discuss with the hospitalist for admission.  The patients results and plan were reviewed and discussed.   Any x-rays performed were independently reviewed by myself.   Differential diagnosis were considered with the presenting HPI.  Medications  morphine 4 MG/ML injection 2 mg (has no  administration in time range)  ondansetron (ZOFRAN) injection 4 mg (has no administration in time range)  0.9 %  sodium chloride infusion (has no administration in time range)    Vitals:   04/22/17 1045 04/22/17 1047 04/22/17 1145 04/22/17 1230  BP: (!) 111/92  (!) 111/94 126/89  Pulse:  69 79 72  Resp: (!) 26 19 (!) 21 (!) 27  Temp:  TempSrc:      SpO2:  100% (!) 77% 100%    Final diagnoses:  Closed displaced intertrochanteric fracture of right femur, initial encounter [facility])    Admission/ observation were discussed with the admitting physician, patient and/or family and they are comfortable with the plan.    Final Clinical Impressions(s) / ED Diagnoses   Final diagnoses:  Closed displaced intertrochanteric fracture of right femur, initial encounter [facility])    ED Discharge Orders    None       Melene Plan, DO 04/22/17 1303

## 2017-04-22 NOTE — Consult Note (Signed)
Reason for Consult:Hip fx Referring Physician: D Jisel [name] is an 84 y.o. female with HTN, dyslipidemia, CAD, and LE edema. HPI: [name] was walking in the kitchen when she hit something and fell. She is somewhat confused and thinks she just slumped in a chair but family says she fell. She c/o right hip pain. X-rays showed an intertroch fx on that side and orthopedic surgery was consulted.  Past Medical History:  Diagnosis Date  . AAA (abdominal aortic aneurysm) ([place])   . Carotid artery stenosis   . Coronary atherosclerosis of native coronary artery   . Hypercholesteremia   . Hypertension, essential, benign   . PVD (peripheral vascular disease) ([place])   . Rhinitis, allergic    NOS    Past Surgical History:  Procedure Laterality Date  . CAROTID ENDARTERECTOMY  03/2008   Right  . COLONOSCOPY  1996   in [place] GRAFT  05/22/2006   Left int mammary to distal left ant descending CA, spahenous vein graft to ramus intermediate branch  . MITRAL VALVE REPAIR  05/22/2006   Quadrangular resection of post leaflet w/ sliding leaflet annuloplaty and 71m Edwards Physio ring    Family History  Problem Relation Age of Onset  . Heart attack Mother     Social History:  reports that she has quit smoking. She has never used smokeless tobacco. Her alcohol and drug histories are not on file.  Allergies: No Known Allergies  Medications: I have reviewed the patient's current medications.  Results for orders placed or performed during the hospital encounter of 04/22/17 (from the past 48 hour(s))  Basic metabolic panel     Status: Abnormal   Collection Time: 04/22/17 11:20 AM  Result Value Ref Range   Sodium 141 135 - 145 mmol/L   Potassium 4.4 3.5 - 5.1 mmol/L   Chloride 101 101 - 111 mmol/L   CO2 28 22 - 32 mmol/L   Glucose, Bld 117 (H) 65 - 99 mg/dL   BUN 20 6 - 20 mg/dL   Creatinine, Ser 1.03 (H) 0.44 - 1.00 mg/dL   Calcium 9.1 8.9 - 10.3 mg/dL   GFR calc non  Af Amer 49 (L) >60 mL/min   GFR calc Af Amer 57 (L) >60 mL/min    Comment: (NOTE) The eGFR has been calculated using the CKD EPI equation. This calculation has not been validated in all clinical situations. eGFR's persistently <60 mL/min signify possible Chronic Kidney Disease.    Anion gap 12 5 - 15    Comment: Performed at [facility] [place] [address] CBC WITH DIFFERENTIAL     Status: Abnormal   Collection Time: 04/22/17 11:20 AM  Result Value Ref Range   WBC 7.9 4.0 - 10.5 K/uL   RBC 5.45 (H) 3.87 - 5.11 MIL/uL   Hemoglobin 12.0 12.0 - 15.0 g/dL   HCT 44.8 36.0 - 46.0 %   MCV 82.2 78.0 - 100.0 fL   MCH 22.0 (L) 26.0 - 34.0 pg   MCHC 26.8 (L) 30.0 - 36.0 g/dL   RDW 18.3 (H) 11.5 - 15.5 %   Platelets 173 150 - 400 K/uL   Neutrophils Relative % 84 %   Neutro Abs 6.6 1.7 - 7.7 K/uL   Lymphocytes Relative 8 %   Lymphs Abs 0.6 (L) 0.7 - 4.0 K/uL   Monocytes Relative 8 %   Monocytes Absolute 0.6 0.1 - 1.0 K/uL  Eosinophils Relative 0 %   Eosinophils Absolute 0.0 0.0 - 0.7 K/uL   Basophils Relative 0 %   Basophils Absolute 0.0 0.0 - 0.1 K/uL    Comment: Performed at [facility] [address], [place], [place] [address]  Protime-INR     Status: Abnormal   Collection Time: 04/22/17 11:20 AM  Result Value Ref Range   Prothrombin Time 15.4 (H) 11.4 - 15.2 seconds   INR 1.23     Comment: Performed at [facility] [address] [place], [place] [address]    Dg Chest 1 View  Result Date: 04/22/2017 CLINICAL DATA:  Preop evaluation for right hip surgery EXAM: CHEST  1 VIEW COMPARISON:  04/05/2008 FINDINGS: Cardiac shadow remains enlarged. Postsurgical changes are again seen. Right-sided pleural effusion and atelectatic/infiltrative changes are noted. No pneumothorax is seen. No acute bony abnormality is noted. IMPRESSION: Right basilar atelectatic/infiltrative changes with associated effusion. Electronically Signed   By: Inez Catalina  M.Melissa.   On: 04/22/2017 11:56   Dg Hip Unilat W Or Wo Pelvis 2-3 Views Right  Result Date: 04/22/2017 CLINICAL DATA:  Recent fall with right leg deformity EXAM: DG HIP (WITH OR WITHOUT PELVIS) 2-3V RIGHT COMPARISON:  None. FINDINGS: Pelvic ring is intact. Comminuted intratrochanteric fracture of the proximal right femur is noted with impaction and angulation at the fracture site. No soft tissue changes are seen. Diffuse vascular calcifications are noted. IMPRESSION: Comminuted intratrochanteric fracture of the proximal right femur. Electronically Signed   By: Inez Catalina M.Melissa.   On: 04/22/2017 12:01    Review of Systems  Constitutional: Negative for weight loss.  HENT: Negative for ear discharge, ear pain, hearing loss and tinnitus.   Eyes: Negative for blurred vision, double vision, photophobia and pain.  Respiratory: Negative for cough, sputum production and shortness of breath.   Cardiovascular: Negative for chest pain.  Gastrointestinal: Negative for abdominal pain, nausea and vomiting.  Genitourinary: Negative for dysuria, flank pain, frequency and urgency.  Musculoskeletal: Positive for joint pain (Right hip). Negative for back pain, falls, myalgias and neck pain.  Neurological: Negative for dizziness, tingling, sensory change, focal weakness, loss of consciousness and headaches.  Endo/Heme/Allergies: Does not bruise/bleed easily.  Psychiatric/Behavioral: Negative for depression, memory loss and substance abuse. The patient is not nervous/anxious.    Blood pressure 126/89, pulse 72, temperature 97.7 F (36.5 C), temperature source Oral, resp. rate (!) 27, SpO2 100 %. Physical Exam  Constitutional: She appears well-developed and well-nourished. No distress.  HENT:  Head: Normocephalic and atraumatic.  Eyes: Conjunctivae are normal. Right eye exhibits no discharge. Left eye exhibits no discharge. No scleral icterus.  Neck: Normal range of motion.  Cardiovascular: Normal rate and regular  rhythm.  Respiratory: Effort normal. No respiratory distress.  Musculoskeletal:  LLE No traumatic wounds, ecchymosis, or rash  Mod TTP hip, leg shortened and in ext rotation  No knee or ankle effusion  Knee stable to varus/ valgus and anterior/posterior stress  Sens DPN, SPN, TN intact  Motor EHL, ext, flex, evers 5/5  DP 1+, PT 1+, 3+ pitting edema  Neurological: She is alert.  Skin: Skin is warm and dry. She is not diaphoretic.  Psychiatric: She has a normal mood and affect. Her behavior is normal.    Assessment/Plan: Fall Right intertroch hip fx -- Will need IMN by Dr. Percell Miller, timing TBD. Please keep NPO for now. Multiple medical problems -- per primary service who will admit    Lisette Abu,  PA-C Orthopedic Surgery [phone] 04/22/2017, 12:59 PM

## 2017-04-22 NOTE — ED Notes (Signed)
Notified Dr. Adela LankFloyd and Earney HamburgJeffrey Michael, PA-Ortho to notify that patient has refused IV and medications. Patient is adamant. Patient is confused, but there is no HCPOA for patient. Neighbor has called son (who is on vacation on a cruise) to notify him of patient's condition, and neighbor states son wants patient to be treated - but son is not HCPOA.

## 2017-04-22 NOTE — H&P (Addendum)
History and Physical    [name]:[id number] DOB: 02/02/1932 DOA: 04/22/2017  **Will admit patient based on the expectation that the patient will need hospitalization/ [name] care that crosses at least 2 midnights  PCP: Melissa Marry, MD   Attending physician: [name]  Patient coming from/Resides with: Private residence/[name]  Chief Complaint: Right leg pain after falling  HPI: [name] is a 84 y.o. female with medical history significant for history of CAD with prior CABG, history of mitral valve repair, hypertension, dyslipidemia, peripheral vascular disease, carotid artery stenosis, AAA, history of chronic diastolic heart failure with associated pulmonary hypertension.  I suspect the patient also has likely undiagnosed COPD in context of former tobacco abuse.  Based on my interaction with the patient today she also appears to have undiagnosed dementia.  Morning while walking.  She reported that her right leg was hurting and that she was unable to get up and bear weight.  Family reports the patient has been confused "for a few days".  Was transported to this facility by [name].  In the right leg reveals a comminuted intertrochanteric fracture of the proximal right femur.  Patient has been evaluated by orthopedic surgery PA and plans are to proceed with surgical intervention ending surgeon evaluation.  In addition patient was found to be hypoxemic with sats dropping into the 70s.  Chest x-ray reveals what appears to be asymmetric edema with marked amount of fluid in the fissure and a right pleural effusion associated with marked 3+ bilateral lower extremity edema.  Patient admits to inconsistent ingestion of home medications especially her Lasix.  Patient is clearly confused and the majority of the responses to questions asked are in appropriate.  She also interjects and appropriately regarding conversational speech.  [name] is at bedside.  He is requesting that his  son as well as daughter-in-law who is a nurse assist him with the decision-making process and be allowed to be contacts for this patient regarding decision-making processes.  So far, the patient is refusing care.  She has refused IV sticks.  She tells providers "we can think about that later, we can do that tomorrow" regarding proceeding with surgery today.  ED Course:  Vital Signs: BP 122/85   Pulse 72   Temp 97.7 F (36.5 C) (Oral)   Resp 15   SpO2 98%  CXR: Read as right basilar atelectatic versus infiltrative changes with associated effusion.  On my exam patient appears to have increased curly B-lines with diffuse haziness consistent with interstitial edema, marked edema in the middle fissure with an associated pleural effusion although this could also be a dense platelike atelectasis/infiltrate. DG right femur: As above Lab data: Sodium 141, potassium 4.4, chloride 101, CO2 28, glucose 117, BUN 20, creatinine 1.03, anion gap 12, white count 7900 with neutrophils 84% but normal absolute neutrophils, hemoglobin 12 with normal MCV 82.2, platelets 173,000, PT 15.4, INR 1.23 Medications and treatments: Morphine and Zofran IV offered but patient refused, she has refused placement of IV  Review of Systems:  Unable to obtain from patient given what appears to be chronic altered mentation.  Neighbor reports patient with chronic confusion and abnormal behaviors and verbalizes concerns over undiagnosed dementia.   Past Medical History:  Diagnosis Date  . AAA (abdominal aortic aneurysm) ([place])   . Carotid artery stenosis   . Chronic diastolic heart failure ([place])   . Coronary atherosclerosis of native coronary artery   . Dementia   . Hypercholesteremia   .  Hypertension, essential, benign   . PVD (peripheral vascular disease) ([place])   . Rhinitis, allergic    NOS    Past Surgical History:  Procedure Laterality Date  . CAROTID ENDARTERECTOMY  03/2008   Right  . COLONOSCOPY  1996   in [place] GRAFT  05/22/2006   Left int mammary to distal left ant descending CA, spahenous vein graft to ramus intermediate branch  . MITRAL VALVE REPAIR  05/22/2006   Quadrangular resection of post leaflet w/ sliding leaflet annuloplaty and 30m Edwards Physio ring    Social History   Socioeconomic History  . Marital status: Married    Spouse name: Not on file  . Number of children: Not on file  . Years of education: Not on file  . Highest education level: Not on file  Occupational History  . Occupation: Retired from [employer] account executive Social Needs  . Financial resource strain: Not on file  . Food insecurity:    Worry: Not on file    Inability: Not on file  . Transportation needs:    Medical: Not on file    Non-medical: Not on file  Tobacco Use  . Smoking status: Former Smoker    Types: Cigarettes  . Smokeless tobacco: Never Used  Substance and Sexual Activity  . Alcohol use: Not Currently  . Drug use: Never  . Sexual activity: Not Currently  Lifestyle  . Physical activity:    Days per week: Not on file    Minutes per session: Not on file  . Stress: Not on file  Relationships  . Social connections:    Talks on phone: Not on file    Gets together: Not on file    Attends religious service: Not on file    Active member of club or organization: Not on file    Attends meetings of clubs or organizations: Not on file    Relationship status: Not on file  . Intimate partner violence:    Fear of current or ex partner: Not on file    Emotionally abused: Not on file    Physically abused: Not on file    Forced sexual activity: Not on file  Other Topics Concern  . Not on file  Social History Narrative  . Not on file    Mobility: Independent Work history: Not obtained   No Known Allergies  Family History  Problem Relation Age of Onset  . Heart attack Mother      Prior to Admission  medications   Medication Sig Start Date End Date Taking? Authorizing Provider  amLODipine (NORVASC) 5 MG tablet TAKE 1 TABLET (5 MG TOTAL) BY MOUTH DAILY. 02/13/16   CLelon Perla MD  aspirin 81 MG tablet Take 81 mg by mouth daily.      [provider]  atorvastatin (LIPITOR) 40 MG tablet Take 1 tablet (40 mg total) by mouth daily at 6 PM. NEED OV. 01/07/17   CLelon Perla MD  enalapril (VASOTEC) 20 MG tablet TAKE 1 TABLET (20 MG TOTAL) BY MOUTH 2 (TWO) TIMES DAILY. NEED OV. 04/01/17   CLelon Perla MD  furosemide (LASIX) 40 MG tablet TAKE 1 TABLET BY MOUTH EVERY DAY 03/23/16   CLelon Perla MD  KLOR-CON M20 20 MEQ tablet TAKE 1 TABLET EVERY DAY -MUST MAKE APPOINTMENT FOR FURTHER REFILLS 07/10/16   CLelon Perla MD  metoprolol succinate (TOPROL-XL) 100 MG 24 hr tablet TAKE 1 TABLET (100 MG TOTAL) BY MOUTH DAILY. NEED OV. 04/01/17   Lelon Perla, MD    Physical Exam: Vitals:   04/22/17 1047 04/22/17 1145 04/22/17 1230 04/22/17 1300  BP:  (!) 111/94 126/89 122/85  Pulse: 69 79 72 72  Resp: 19 (!) 21 (!) 27 15  Temp:      TempSrc:      SpO2: 100% (!) 77% 100% 98%      Constitutional: NAD, restless and fidgety, uncomfortable secondary to right femur fracture Eyes: PERRL, lids and conjunctivae normal ENMT: Mucous membranes are moist. Posterior pharynx clear of any exudate or lesions. Poor dentition.  Neck: normal, supple, no masses, no thyromegaly Respiratory: Auscultated anteriorly secondary to patient inability to lean forward for pulmonary exam.  Marked diminished lung sounds on the right with scattered crackles primarily on the right.  Normal respiratory effort without accessory muscle use.  Patient pulls off oxygen and has to be coaxed into placing back on.  On room air O2 sat dropped to 79%. Cardiovascular: Regular rate and rhythm, no murmurs / rubs / gallops.  3-4+ bilateral lower extremity edema. 2+ pedal pulses. No carotid bruits.  Abdomen: no  tenderness, no masses palpated. No hepatosplenomegaly. Bowel sounds positive.  Musculoskeletal: no clubbing / cyanosis. No joint deformity upper and lower extremities although does have internal rotation right lower extremity. Good ROM, no contractures. Normal muscle tone.  Skin: no rashes, lesions, ulcers. No induration Neurologic: CN 2-12 grossly intact. Sensation intact, DTR normal. Strength 5/5 x all 4 extremities.  Psychiatric:  Alert and oriented x name only.  Anxious and restless mood.    Labs on Admission: I have personally reviewed following labs and imaging studies  CBC: Recent Labs  Lab 04/22/17 1120  WBC 7.9  NEUTROABS 6.6  HGB 12.0  HCT 44.8  MCV 82.2  PLT 169   Basic Metabolic Panel: Recent Labs  Lab 04/22/17 1120  NA 141  K 4.4  CL 101  CO2 28  GLUCOSE 117*  BUN 20  CREATININE 1.03*  CALCIUM 9.1   GFR: CrCl cannot be calculated (Unknown ideal weight.). Liver Function Tests: No results for input(s): AST, ALT, ALKPHOS, BILITOT, PROT, ALBUMIN in the last 168 hours. No results for input(s): LIPASE, AMYLASE in the last 168 hours. No results for input(s): AMMONIA in the last 168 hours. Coagulation Profile: Recent Labs  Lab 04/22/17 1120  INR 1.23   Cardiac Enzymes: No results for input(s): CKTOTAL, CKMB, CKMBINDEX, TROPONINI in the last 168 hours. BNP (last 3 results) No results for input(s): PROBNP in the last 8760 hours. HbA1C: No results for input(s): HGBA1C in the last 72 hours. CBG: No results for input(s): GLUCAP in the last 168 hours. Lipid Profile: No results for input(s): CHOL, HDL, LDLCALC, TRIG, CHOLHDL, LDLDIRECT in the last 72 hours. Thyroid Function Tests: No results for input(s): TSH, T4TOTAL, FREET4, T3FREE, THYROIDAB in the last 72 hours. Anemia Panel: No results for input(s): VITAMINB12, FOLATE, FERRITIN, TIBC, IRON, RETICCTPCT in the last 72 hours. Urine analysis:    Component Value Date/Time   COLORURINE YELLOW 04/05/2008 0629    APPEARANCEUR CLEAR 04/05/2008 0629   LABSPEC 1.027 04/05/2008 0629   PHURINE 5.0 04/05/2008 0629   GLUCOSEU NEGATIVE 04/05/2008 0629   HGBUR NEGATIVE 04/05/2008 0629   BILIRUBINUR NEGATIVE 04/05/2008 0629   KETONESUR NEGATIVE 04/05/2008 0629   PROTEINUR NEGATIVE 04/05/2008 0629   UROBILINOGEN 0.2 04/05/2008 0629   NITRITE NEGATIVE  04/05/2008 0629   LEUKOCYTESUR SMALL (A) 04/05/2008 0629   Sepsis Labs: _0 (procalcitonin:4,lacticidven:4) )No results found for this or any previous visit (from the past 240 hour(s)).   Radiological Exams on Admission: Dg Chest 1 View  Result Date: 04/22/2017 CLINICAL DATA:  Preop evaluation for right hip surgery EXAM: CHEST  1 VIEW COMPARISON:  04/05/2008 FINDINGS: Cardiac shadow remains enlarged. Postsurgical changes are again seen. Right-sided pleural effusion and atelectatic/infiltrative changes are noted. No pneumothorax is seen. No acute bony abnormality is noted. IMPRESSION: Right basilar atelectatic/infiltrative changes with associated effusion. Electronically Signed   By: Inez Catalina M.D.   On: 04/22/2017 11:56   Dg Hip Unilat W Or Wo Pelvis 2-3 Views Right  Result Date: 04/22/2017 CLINICAL DATA:  Recent fall with right leg deformity EXAM: DG HIP (WITH OR WITHOUT PELVIS) 2-3V RIGHT COMPARISON:  None. FINDINGS: Pelvic ring is intact. Comminuted intratrochanteric fracture of the proximal right femur is noted with impaction and angulation at the fracture site. No soft tissue changes are seen. Diffuse vascular calcifications are noted. IMPRESSION: Comminuted intratrochanteric fracture of the proximal right femur. Electronically Signed   By: Inez Catalina M.D.   On: 04/22/2017 12:01    EKG: (Independently reviewed) sinus rhythm with ventricular rate 74 bpm, QTC 459 ms, voltage criteria met for LVH, unifocal PVC, normal R wave rotation, no acute ischemic changes.  Assessment/Plan Principal Problem:   Closed comminuted intertrochanteric fracture of  proximal femur, right, initial encounter  -Patient presents with reported mechanical fall in the home environment with subsequent comminuted femur fracture -Orthopedic services planned surgical repair -Nonweightbearing -Patient currently refusing surgery although I suspect she has undiagnosed dementia and based on my interaction with her I am not sure she has capacity to make a decision to refuse surgery.  I discussed with patient and [name] that if she does not undergo surgical repair that she will not be able to ambulate/be bedbound in the future. -Hip fracture order set initiated to include IV pain medications and IV muscle relaxers -Obtain vitamin D and calcium  Active Problems:    Acute respiratory failure with hypoxia 2/2 ?Acute on chronic diastolic (congestive) heart failure/Pulmonary hypertension vs infection -Patient is poor historian but I suspect she may be experiencing ambulatory hypoxemia at home given of reported issues of dizziness and shortness of breath with ambulation-unknown duration -Documented hypoxic here with abnormal chest x-ray -No fever, leukocytosis or cough -Based on 2014 echo has history of diastolic dysfunction and history of mitral valve repair and mild Neri hypertension with mild to moderate tricuspid regurgitation -Patient has marked lower extremity edema without hemosiderin changes or evidence of stasis dermatitis -Obtain BNP and lower respiratory tract Procalcitonin to help better clarify -Obtain ESR -Pain noncontrasted CT chest as well to help better clarify abnormal findings on chest x-ray -Consider echocardiogram -Patient admits to inconsistent utilization of preadmission ACE inhibitor, beta-blockers and furosemide-resume home medications    Hypertension, essential, benign -Appears controlled -Home medications are as follows: Norvasc, Vasotec, and metoprolol -Resume metoprolol as above but at half preadmission dosages -Hold ACE inhibitor in context of  unclear if acute kidney injury versus progression of chronic kidney disease    ? Dementia -Based on my interaction with the patient she appears to have short-term memory deficits, difficulty processing simple information and appears to lack capacity and judgment regarding medical decision-making process -On carotid duplex from 2017 it was noted that patient had abnormal appearance to right thyroid gland with thyroid imaging recommended by cardiologist but it appears  patient never followed up to have any of her recommended follow-up testing. -Even her altered mental status and significant peripheral edema will obtain TSH -Consult psych for formal evaluation regarding capacity    CKD (chronic kidney disease), stage III  -Unclear if has acute versus chronic kidney disease noting last available labs from 2015 and GFR has decreased from normal to 49 -Hold ACE inhibitor for now    Former tobacco use -May have undiagnosed COPD -Abnormalities in lung could reflect underlying malignancy therefore follow-up on CT of the chest    CAD (coronary artery disease)-prior CABG 2008 -Continue aspirin statin and beta-blocker -Current EKG unremarkable and patient is symptom-free    Hypercholesteremia -Continue statin    PVD (peripheral vascular disease)  -No reports of claudication with ambulation    AAA (abdominal aortic aneurysm)  -Ultrasound 2017: Stable dimensions of the infrarenal fusiform AAA measuring 2.4 cm x 2.6 cm. -Not follow-up for scheduled ultrasound April 2018    Carotid artery stenosis -Carotid duplex February 2017:Acute occlusion of the right proximal to distal CCA, ECA, and proximal to distal ICA. Elevated left proximal CCA velocities. Progression of LICA stenosis, now in the 40-59% range. Right subclavian steal noted, with retrograde flow from the right vertebral artery.  Incidental finding of abnormal appearance of the right thyroid -Cardiologist's office spoke with patient on 02/25/15  to inform her that she needed dedicated thyroid ultrasound, peripheral vascular consult and follow-up carotid Dopplers in 1 year.  Patient was given the number to the imaging department at [facility] but it appears patient did not undergo ultrasound soft tissue head and neck.    **Additional lab, imaging and/or diagnostic evaluation at discretion of supervising physician  DVT prophylaxis: SCDs Code Status: Full Family Communication: [name] and neighbor with [name] and patient approval Disposition Plan: To be determined-may require SNF for rehab before discharge to home Consults called: Orthopedic team/[place]; [facility]/psychiatry on call-electronic request placed after discussing with Dr. Tami Ribas ANP-BC [facility] Pager [phone]   If 7PM-7AM, please contact night-coverage [email or web address] Password [email or web address]  04/22/2017, 1:59 PM

## 2017-04-22 NOTE — ED Notes (Signed)
Patient transported to X-ray 

## 2017-04-22 NOTE — ED Notes (Signed)
Patient placed on O2 @ 2.5LPM. Sats 95%

## 2017-04-22 NOTE — H&P (View-Only) (Signed)
Reason for Consult:Hip fx Referring Physician: D Shawnee [name] is an 84 y.o. female with HTN, dyslipidemia, CAD, and LE edema. HPI: [name] was walking in the kitchen when she hit something and fell. She is somewhat confused and thinks she just slumped in a chair but family says she fell. She c/o right hip pain. X-rays showed an intertroch fx on that side and orthopedic surgery was consulted.  Past Medical History:  Diagnosis Date  . AAA (abdominal aortic aneurysm) ([place])   . Carotid artery stenosis   . Coronary atherosclerosis of native coronary artery   . Hypercholesteremia   . Hypertension, essential, benign   . PVD (peripheral vascular disease) ([place])   . Rhinitis, allergic    NOS    Past Surgical History:  Procedure Laterality Date  . CAROTID ENDARTERECTOMY  03/2008   Right  . COLONOSCOPY  1996   in [place] GRAFT  05/22/2006   Left int mammary to distal left ant descending CA, spahenous vein graft to ramus intermediate branch  . MITRAL VALVE REPAIR  05/22/2006   Quadrangular resection of post leaflet w/ sliding leaflet annuloplaty and 75m Edwards Physio ring    Family History  Problem Relation Age of Onset  . Heart attack Mother     Social History:  reports that she has quit smoking. She has never used smokeless tobacco. Her alcohol and drug histories are not on file.  Allergies: No Known Allergies  Medications: I have reviewed the patient's current medications.  Results for orders placed or performed during the hospital encounter of 04/22/17 (from the past 48 hour(s))  Basic metabolic panel     Status: Abnormal   Collection Time: 04/22/17 11:20 AM  Result Value Ref Range   Sodium 141 135 - 145 mmol/L   Potassium 4.4 3.5 - 5.1 mmol/L   Chloride 101 101 - 111 mmol/L   CO2 28 22 - 32 mmol/L   Glucose, Bld 117 (H) 65 - 99 mg/dL   BUN 20 6 - 20 mg/dL   Creatinine, Ser 1.03 (H) 0.44 - 1.00 mg/dL   Calcium 9.1 8.9 - 10.3 mg/dL   GFR calc non  Af Amer 49 (L) >60 mL/min   GFR calc Af Amer 57 (L) >60 mL/min    Comment: (NOTE) The eGFR has been calculated using the CKD EPI equation. This calculation has not been validated in all clinical situations. eGFR's persistently <60 mL/min signify possible Chronic Kidney Disease.    Anion gap 12 5 - 15    Comment: Performed at [facility], [place] [id number] CBC WITH DIFFERENTIAL     Status: Abnormal   Collection Time: 04/22/17 11:20 AM  Result Value Ref Range   WBC 7.9 4.0 - 10.5 K/uL   RBC 5.45 (H) 3.87 - 5.11 MIL/uL   Hemoglobin 12.0 12.0 - 15.0 g/dL   HCT 44.8 36.0 - 46.0 %   MCV 82.2 78.0 - 100.0 fL   MCH 22.0 (L) 26.0 - 34.0 pg   MCHC 26.8 (L) 30.0 - 36.0 g/dL   RDW 18.3 (H) 11.5 - 15.5 %   Platelets 173 150 - 400 K/uL   Neutrophils Relative % 84 %   Neutro Abs 6.6 1.7 - 7.7 K/uL   Lymphocytes Relative 8 %   Lymphs Abs 0.6 (L) 0.7 - 4.0 K/uL   Monocytes Relative 8 %   Monocytes Absolute 0.6 0.1 - 1.0 K/uL  Eosinophils Relative 0 %   Eosinophils Absolute 0.0 0.0 - 0.7 K/uL   Basophils Relative 0 %   Basophils Absolute 0.0 0.0 - 0.1 K/uL    Comment: Performed at [facility] [address], [place],  [address]  Protime-INR     Status: Abnormal   Collection Time: 04/22/17 11:20 AM  Result Value Ref Range   Prothrombin Time 15.4 (H) 11.4 - 15.2 seconds   INR 1.23     Comment: Performed at [facility] [address], [place],  [address]    Dg Chest 1 View  Result Date: 04/22/2017 CLINICAL DATA:  Preop evaluation for right hip surgery EXAM: CHEST  1 VIEW COMPARISON:  04/05/2008 FINDINGS: Cardiac shadow remains enlarged. Postsurgical changes are again seen. Right-sided pleural effusion and atelectatic/infiltrative changes are noted. No pneumothorax is seen. No acute bony abnormality is noted. IMPRESSION: Right basilar atelectatic/infiltrative changes with associated effusion. Electronically Signed   By: Inez Catalina  M.Melissa.   On: 04/22/2017 11:56   Dg Hip Unilat W Or Wo Pelvis 2-3 Views Right  Result Date: 04/22/2017 CLINICAL DATA:  Recent fall with right leg deformity EXAM: DG HIP (WITH OR WITHOUT PELVIS) 2-3V RIGHT COMPARISON:  None. FINDINGS: Pelvic ring is intact. Comminuted intratrochanteric fracture of the proximal right femur is noted with impaction and angulation at the fracture site. No soft tissue changes are seen. Diffuse vascular calcifications are noted. IMPRESSION: Comminuted intratrochanteric fracture of the proximal right femur. Electronically Signed   By: Inez Catalina M.Melissa.   On: 04/22/2017 12:01    Review of Systems  Constitutional: Negative for weight loss.  HENT: Negative for ear discharge, ear pain, hearing loss and tinnitus.   Eyes: Negative for blurred vision, double vision, photophobia and pain.  Respiratory: Negative for cough, sputum production and shortness of breath.   Cardiovascular: Negative for chest pain.  Gastrointestinal: Negative for abdominal pain, nausea and vomiting.  Genitourinary: Negative for dysuria, flank pain, frequency and urgency.  Musculoskeletal: Positive for joint pain (Right hip). Negative for back pain, falls, myalgias and neck pain.  Neurological: Negative for dizziness, tingling, sensory change, focal weakness, loss of consciousness and headaches.  Endo/Heme/Allergies: Does not bruise/bleed easily.  Psychiatric/Behavioral: Negative for depression, memory loss and substance abuse. The patient is not nervous/anxious.    Blood pressure 126/89, pulse 72, temperature 97.7 F (36.5 C), temperature source Oral, resp. rate (!) 27, SpO2 100 %. Physical Exam  Constitutional: She appears well-developed and well-nourished. No distress.  HENT:  Head: Normocephalic and atraumatic.  Eyes: Conjunctivae are normal. Right eye exhibits no discharge. Left eye exhibits no discharge. No scleral icterus.  Neck: Normal range of motion.  Cardiovascular: Normal rate and regular  rhythm.  Respiratory: Effort normal. No respiratory distress.  Musculoskeletal:  LLE No traumatic wounds, ecchymosis, or rash  Mod TTP hip, leg shortened and in ext rotation  No knee or ankle effusion  Knee stable to varus/ valgus and anterior/posterior stress  Sens DPN, SPN, TN intact  Motor EHL, ext, flex, evers 5/5  DP 1+, PT 1+, 3+ pitting edema  Neurological: She is alert.  Skin: Skin is warm and dry. She is not diaphoretic.  Psychiatric: She has a normal mood and affect. Her behavior is normal.    Assessment/Plan: Fall Right intertroch hip fx -- Will need IMN by Dr. Percell Miller, timing TBD. Please keep NPO for now. Multiple medical problems -- per primary service who will admit    Lisette Abu,  PA-C Orthopedic Surgery [phone] 04/22/2017, 12:59 PM

## 2017-04-23 ENCOUNTER — Encounter (HOSPITAL_COMMUNITY): Payer: Self-pay | Admitting: Physician Assistant

## 2017-04-23 ENCOUNTER — Inpatient Hospital Stay (HOSPITAL_COMMUNITY): Payer: Medicare Other

## 2017-04-23 DIAGNOSIS — Z01811 Encounter for preprocedural respiratory examination: Secondary | ICD-10-CM

## 2017-04-23 DIAGNOSIS — I10 Essential (primary) hypertension: Secondary | ICD-10-CM

## 2017-04-23 DIAGNOSIS — R0902 Hypoxemia: Secondary | ICD-10-CM

## 2017-04-23 DIAGNOSIS — I361 Nonrheumatic tricuspid (valve) insufficiency: Secondary | ICD-10-CM

## 2017-04-23 DIAGNOSIS — I272 Pulmonary hypertension, unspecified: Secondary | ICD-10-CM

## 2017-04-23 DIAGNOSIS — I5033 Acute on chronic diastolic (congestive) heart failure: Secondary | ICD-10-CM

## 2017-04-23 DIAGNOSIS — S72141A Displaced intertrochanteric fracture of right femur, initial encounter for closed fracture: Principal | ICD-10-CM

## 2017-04-23 DIAGNOSIS — Z008 Encounter for other general examination: Secondary | ICD-10-CM

## 2017-04-23 DIAGNOSIS — J90 Pleural effusion, not elsewhere classified: Secondary | ICD-10-CM

## 2017-04-23 LAB — HEPATIC FUNCTION PANEL
ALBUMIN: 2.5 g/dL — AB (ref 3.5–5.0)
ALT: 17 U/L (ref 14–54)
AST: 54 U/L — ABNORMAL HIGH (ref 15–41)
Alkaline Phosphatase: 35 U/L — ABNORMAL LOW (ref 38–126)
BILIRUBIN DIRECT: 0.7 mg/dL — AB (ref 0.1–0.5)
Indirect Bilirubin: 1.2 mg/dL — ABNORMAL HIGH (ref 0.3–0.9)
TOTAL PROTEIN: 5 g/dL — AB (ref 6.5–8.1)
Total Bilirubin: 1.9 mg/dL — ABNORMAL HIGH (ref 0.3–1.2)

## 2017-04-23 LAB — BASIC METABOLIC PANEL
ANION GAP: 12 (ref 5–15)
BUN: 24 mg/dL — ABNORMAL HIGH (ref 6–20)
CHLORIDE: 103 mmol/L (ref 101–111)
CO2: 25 mmol/L (ref 22–32)
Calcium: 8.6 mg/dL — ABNORMAL LOW (ref 8.9–10.3)
Creatinine, Ser: 0.87 mg/dL (ref 0.44–1.00)
GFR calc non Af Amer: >60 mL/min (ref >60)
Glucose, Bld: 100 mg/dL — ABNORMAL HIGH (ref 65–99)
Potassium: 5 mmol/L (ref 3.5–5.1)
SODIUM: 140 mmol/L (ref 135–145)

## 2017-04-23 LAB — PROCALCITONIN

## 2017-04-23 LAB — GLUCOSE, CAPILLARY
GLUCOSE-CAPILLARY: 91 mg/dL (ref 65–99)
GLUCOSE-CAPILLARY: 93 mg/dL (ref 65–99)
Glucose-Capillary: 100 mg/dL — ABNORMAL HIGH (ref 65–99)

## 2017-04-23 LAB — ECHOCARDIOGRAM COMPLETE
HEIGHTINCHES: 62 in
Weight: 1619.06 oz

## 2017-04-23 LAB — SURGICAL PCR SCREEN
MRSA, PCR: NEGATIVE
Staphylococcus aureus: NEGATIVE

## 2017-04-23 LAB — TROPONIN I
TROPONIN I: 0.42 ng/mL — AB (ref <0.03)
Troponin I: 0.45 ng/mL (ref <0.03)

## 2017-04-23 LAB — CBC WITH DIFFERENTIAL/PLATELET
BASOS ABS: 0 10*3/uL (ref 0.0–0.1)
Basophils Relative: 0 %
Eosinophils Absolute: 0 10*3/uL (ref 0.0–0.7)
Eosinophils Relative: 0 %
HEMATOCRIT: 42 % (ref 36.0–46.0)
HEMOGLOBIN: 11.2 g/dL — AB (ref 12.0–15.0)
LYMPHS PCT: 4 %
Lymphs Abs: 0.4 10*3/uL — ABNORMAL LOW (ref 0.7–4.0)
MCH: 22.2 pg — ABNORMAL LOW (ref 26.0–34.0)
MCHC: 26.7 g/dL — AB (ref 30.0–36.0)
MCV: 83.2 fL (ref 78.0–100.0)
MONOS PCT: 9 %
Monocytes Absolute: 0.9 10*3/uL (ref 0.1–1.0)
NEUTROS PCT: 87 %
Neutro Abs: 8.2 10*3/uL — ABNORMAL HIGH (ref 1.7–7.7)
Platelets: 153 10*3/uL (ref 150–400)
RBC: 5.05 MIL/uL (ref 3.87–5.11)
RDW: 18.3 % — ABNORMAL HIGH (ref 11.5–15.5)
WBC: 9.5 10*3/uL (ref 4.0–10.5)

## 2017-04-23 LAB — LACTATE DEHYDROGENASE: LDH: 666 U/L — AB (ref 98–192)

## 2017-04-23 LAB — MAGNESIUM: MAGNESIUM: 1.9 mg/dL (ref 1.7–2.4)

## 2017-04-23 MED ORDER — CEFAZOLIN SODIUM-DEXTROSE 2-4 GM/100ML-% IV SOLN: 2.0000 g | INTRAVENOUS | Status: AC

## 2017-04-23 MED ORDER — ENOXAPARIN SODIUM 30 MG/0.3ML ~~LOC~~ SOLN
30.0000 mg | SUBCUTANEOUS | Status: DC
  Administered 2017-04-23 – 2017-05-05 (×11): 30 mg via SUBCUTANEOUS
  Filled 2017-04-23 (×11): qty 0.3

## 2017-04-23 MED ORDER — CHLORHEXIDINE GLUCONATE 4 % EX LIQD: 60.0000 mL | Freq: Once | CUTANEOUS | Status: DC

## 2017-04-23 MED ORDER — POVIDONE-IODINE 10 % EX SWAB: 2.0000 "application " | Freq: Once | CUTANEOUS | Status: DC

## 2017-04-23 MED ORDER — FUROSEMIDE 10 MG/ML IJ SOLN
40.0000 mg | Freq: Two times a day (BID) | INTRAMUSCULAR | Status: DC
  Administered 2017-04-23 – 2017-04-24 (×3): 40 mg via INTRAVENOUS
  Filled 2017-04-23 (×4): qty 4

## 2017-04-23 MED ORDER — SODIUM CHLORIDE 0.9 % IV BOLUS
500.0000 mL | Freq: Once | INTRAVENOUS | Status: AC
  Administered 2017-04-23: 500 mL via INTRAVENOUS

## 2017-04-23 NOTE — Progress Notes (Signed)
Lab called with Triponin level of .42. Dr. Janee Mornhompson notified.

## 2017-04-23 NOTE — Progress Notes (Signed)
  Echocardiogram 2D Echocardiogram has been performed.  Tye SavoyCasey N Garnell Phenix 04/23/2017, 12:30 PM

## 2017-04-23 NOTE — Progress Notes (Signed)
PROGRESS NOTE    [name] B [name]  ZOX:[id number] DOB: 06/08/1932 DOA: 04/22/2017 PCP: No primary care provider on file.   Brief Narrative:  Is a 84 year old female history of coronary artery disease, prior CABG, history of mitral valve repair, hypertension, dyslipidemia, peripheral vascular disease, carotid artery stenosis, AAA, history of chronic diastolic heart failure associated pulmonary hypertension presents to the ED after mechanical fall and noted to have a right intertrochanteric hip fracture.  Patient also noted to be hypoxemic with sats dropping in the 70s.  Chest x-ray and physical exam concerning for volume overload.  Patient also noted to have a right-sided pleural effusion and lower extremity edema.  BNP significantly elevated.  Patient with borderline blood pressure and as such diuretics were held.  Cardiac enzymes cycled with initial enzyme elevated at 0.42.  Cardiology consulted for further evaluation and cardiac clearance.  Pulmonary also consulted due to moderate size right-sided pleural effusion.  Orthopedics consulted to repair hip fracture.   Assessment & Plan:   Principal Problem:   Closed comminuted intertrochanteric fracture of proximal femur, right, initial encounter (HCC) Active Problems:   Hypertension, essential, benign   Acute on chronic diastolic (congestive) heart failure (HCC)   Hypercholesteremia   PVD (peripheral vascular disease) (HCC)   Dementia   AAA (abdominal aortic aneurysm) (HCC)   Acute respiratory failure with hypoxia (HCC)   CKD (chronic kidney disease), stage III (HCC)   Carotid artery stenosis   Pulmonary hypertension (HCC)   Former tobacco use   CAD (coronary artery disease)-prior CABG 2008   Pleural effusion, right   Preoperative respiratory examination  #1 closed right intertrochanteric hip fracture Questionable etiology.  Unknown as to whether it was secondary to mechanical fall.  Cardiac enzymes are being cycled and initial troponin is  slightly elevated.  Patient in consultation by orthopedics and plan is to have right hip repaired tomorrow 04/24/2017.  Per Ortho.  2.  Acute on chronic respiratory failure/hypoxia/shortness of breath likely secondary to right pleural effusion Patient noted to be hypoxic overnight and noted to have some sats in the 80s.  Patient however not in extremis.  Chest x-ray was ordered this morning concerning for moderate right-sided pleural effusion.  Check LDH.  Consult with pulmonary for further evaluation and management.  Patient may need right pleural effusion tapped prior to surgery.  3.  Elevated troponin Questionable etiology.  Patient with hypoxia.  2D echo has been obtained EF of 60-65%.  Trivial aortic valvular regurgitation.  Images were inadequate for left ventricular wall motion assessment.  Left ventricular systolic function normal.  Continue to cycle serial enzymes.  Cardiology consulted for further evaluation and management and cardiac clearance.  4.  Chronic Kidney disease stage III Stable.  5.  Dementia with psychotic features Patient noted to be demented on admission and felt not to have capacity.  Concern for frontotemporal dementia given hallucinations.  Psychiatry has been consulted and patient deemed not to have capacity.  Appreciate psychiatric input and recommendations.  6.  HTN Blood pressure borderline.  Discontinued this morning however may need to resume due to lower extremity edema and possible volume overload.  Cardiology consulted.  Beta-blocker held secondary to borderline blood pressure.  7.  Acute on chronic diastolic heart failure BNP elevated at 3142.4.  Blood pressure was borderline and as such oral diuretics were discontinued.  Patient with troponin level of 0.42.  2D echo with a EF of 60-65%.  Images inadequate to assess left ventricular wall motion.  Consult with  cardiology for further evaluation and management as blood pressure was borderline this  morning.    DVT prophylaxis: Lovenox Code Status: Full Family Communication: Updated husband at bedside. Disposition Plan: Skilled nursing facility when medically stable, postoperatively and clinically improved.   Consultants:   Pulmonary 04/23/2017  Cardiology 04/23/2017  Psychiatry  Orthopedics 04/22/2017  Procedures:   2D echo 04/23/2017  Chest x-ray 04/23/2017    Antimicrobials:   None   Subjective: Sitting up in bed.  Not really answering questions.  Family at bedside.  Objective: Vitals:   04/23/17 0645 04/23/17 0800 04/23/17 1110 04/23/17 1344  BP: 93/65 92/62 95/72  125/75  Pulse:  96 90 79  Resp:   18   Temp:  (!) 97.4 F (36.3 C) 98.6 F (37 C) 98.1 F (36.7 C)  TempSrc:   Oral Oral  SpO2:  100% 96% (!) 84%  Weight:      Height:        Intake/Output Summary (Last 24 hours) at 04/23/2017 1739 Last data filed at 04/23/2017 1057 Gross per 24 hour  Intake 200 ml  Output 1275 ml  Net -1075 ml   Filed Weights   04/22/17 1546  Weight: 45.9 kg (101 lb 3.1 oz)    Examination:  General exam: Frail cachectic female. Respiratory system: Decrease breath sounds in the right base.  No wheezing.  No crackles.  Respiratory effort normal. Cardiovascular system: S1 & S2 heard, RRR. No JVD, murmurs, rubs, gallops or clicks.  1-2+ bilateral lower extremity edema.  Gastrointestinal system: Abdomen is nondistended, soft and nontender. No organomegaly or masses felt. Normal bowel sounds heard. Central nervous system: Alert and oriented. No focal neurological deficits. Extremities: Right lower extremity shortened and externally rotated.   Skin: No rashes, lesions or ulcers Psychiatry: Judgement and insight appear poor. Mood & affect appropriate.     Data Reviewed: I have personally reviewed following labs and imaging studies  CBC: Recent Labs  Lab 04/22/17 1120 04/23/17 0842  WBC 7.9 9.5  NEUTROABS 6.6 8.2*  HGB 12.0 11.2*  HCT 44.8 42.0  MCV 82.2 83.2  PLT  173 153   Basic Metabolic Panel: Recent Labs  Lab 04/22/17 1120 04/23/17 0842 04/23/17 1453  NA 141  --  140  K 4.4  --  5.0  CL 101  --  103  CO2 28  --  25  GLUCOSE 117*  --  100*  BUN 20  --  24*  CREATININE 1.03*  --  0.87  CALCIUM 9.1  --  8.6*  MG  --  1.9  --    GFR: Estimated Creatinine Clearance: 36.1 mL/min (by C-G formula based on SCr of 0.87 mg/dL). Liver Function Tests: Recent Labs  Lab 04/23/17 0925  AST 54*  ALT 17  ALKPHOS 35*  BILITOT 1.9*  PROT 5.0*  ALBUMIN 2.5*   No results for input(s): LIPASE, AMYLASE in the last 168 hours. No results for input(s): AMMONIA in the last 168 hours. Coagulation Profile: Recent Labs  Lab 04/22/17 1120  INR 1.23   Cardiac Enzymes: Recent Labs  Lab 04/23/17 1453  TROPONINI 0.42*   BNP (last 3 results) No results for input(s): PROBNP in the last 8760 hours. HbA1C: No results for input(s): HGBA1C in the last 72 hours. CBG: Recent Labs  Lab 04/23/17 0635 04/23/17 1057  GLUCAP 100* 91   Lipid Profile: No results for input(s): CHOL, HDL, LDLCALC, TRIG, CHOLHDL, LDLDIRECT in the last 72 hours. Thyroid Function Tests: Recent Labs  04/22/17 2156  TSH 1.686   Anemia Panel: No results for input(s): VITAMINB12, FOLATE, FERRITIN, TIBC, IRON, RETICCTPCT in the last 72 hours. Sepsis Labs: Recent Labs  Lab 04/22/17 2156  PROCALCITON <0.10    Recent Results (from the past 240 hour(s))  Surgical pcr screen     Status: None   Collection Time: 04/23/17  1:07 AM  Result Value Ref Range Status   MRSA, PCR NEGATIVE NEGATIVE Final   Staphylococcus aureus NEGATIVE NEGATIVE Final    Comment: (NOTE) The Xpert SA Assay (FDA approved for NASAL specimens in patients 26 years of age and older), is one component of a comprehensive surveillance program. It is not intended to diagnose infection nor to guide or monitor treatment. Performed at [facility], [address], [place], [place] [address]           Radiology Studies: Dg Chest 1 View  Result Date: 04/22/2017 CLINICAL DATA:  Preop evaluation for right hip surgery EXAM: CHEST  1 VIEW COMPARISON:  04/05/2008 FINDINGS: Cardiac shadow remains enlarged. Postsurgical changes are again seen. Right-sided pleural effusion and atelectatic/infiltrative changes are noted. No pneumothorax is seen. No acute bony abnormality is noted. IMPRESSION: Right basilar atelectatic/infiltrative changes with associated effusion. Electronically Signed   By: Alcide Clever M.D.   On: 04/22/2017 11:56   Dg Chest Port 1 View  Result Date: 04/23/2017 CLINICAL DATA:  84 year old female with hypoxia. Subsequent encounter. EXAM: PORTABLE CHEST 1 VIEW COMPARISON:  04/22/2017 chest x-ray. FINDINGS: Post CABG and valve replacement.  Cardiomegaly. Progressive enlargement of right-sided pleural effusion now moderate to large in size with associated right lung atelectasis. Pulmonary vascular congestion. Medial left base subsegmental atelectasis. Calcified tortuous aorta.  Prominent vascular calcifications. IMPRESSION: Progressive enlargement of right-sided pleural effusion now moderate to large in size with associated right lung atelectasis. Mild atelectasis medial aspect left lung base. Pulmonary vascular congestion. Cardiomegaly post CABG and valve replacement. Aortic Atherosclerosis (ICD10-I70.0). Electronically Signed   By: Lacy Duverney M.D.   On: 04/23/2017 08:45   Dg Hip Unilat W Or Wo Pelvis 2-3 Views Right  Result Date: 04/22/2017 CLINICAL DATA:  Recent fall with right leg deformity EXAM: DG HIP (WITH OR WITHOUT PELVIS) 2-3V RIGHT COMPARISON:  None. FINDINGS: Pelvic ring is intact. Comminuted intratrochanteric fracture of the proximal right femur is noted with impaction and angulation at the fracture site. No soft tissue changes are seen. Diffuse vascular calcifications are noted. IMPRESSION: Comminuted intratrochanteric fracture of the proximal right femur. Electronically  Signed   By: Alcide Clever M.D.   On: 04/22/2017 12:01        Scheduled Meds: . aspirin EC  81 mg Oral Daily  . atorvastatin  40 mg Oral q1800  . chlorhexidine  60 mL Topical Once  . metoprolol succinate  100 mg Oral Daily  .  morphine injection  2 mg Intravenous Once  . ondansetron (ZOFRAN) IV  4 mg Intravenous Once  . potassium chloride SA  20 mEq Oral Daily  . povidone-iodine  2 application Topical Once   Continuous Infusions: .  ceFAZolin (ANCEF) IV    . methocarbamol (ROBAXIN)  IV       LOS: 1 day    Time spent: 35 mins    Ramiro Harvest, MD [facility] Pager [phone]  If 7PM-7AM, please contact night-coverage [email or web address] Password [facility] 04/23/2017, 5:39 PM

## 2017-04-23 NOTE — Consult Note (Signed)
[facility] Consult   Reason for Consult:  Capacity evaluation Referring Physician:  Dr. Grandville Silos Patient Identification: [name] MRN:  [id number] Principal Diagnosis: Evaluation by psychiatric service required Diagnosis:   Patient Active Problem List   Diagnosis Date Noted  . Pleural effusion on right [J90] 04/23/2017  . Closed comminuted intertrochanteric fracture of proximal femur, right, initial encounter ([place]) [S72.141A] 04/22/2017  . Hypertension, essential, benign [I10] 04/22/2017  . Acute on chronic diastolic (congestive) heart failure ([place]) [I50.33] 04/22/2017  . Hypercholesteremia [E78.00] 04/22/2017  . PVD (peripheral vascular disease) ([place]) [I73.9] 04/22/2017  . Dementia [F03.90] 04/22/2017  . AAA (abdominal aortic aneurysm) ([place]) [I71.4] 04/22/2017  . Acute respiratory failure with hypoxia ([place]) [J96.01] 04/22/2017  . CKD (chronic kidney disease), stage III ([place]) [N18.3] 04/22/2017  . Carotid artery stenosis [I65.29] 04/22/2017  . Pulmonary hypertension ([place]) [I27.20] 04/22/2017  . Former tobacco use [Q22.297] 04/22/2017  . CAD (coronary artery disease)-prior CABG 2008 [I25.10] 04/22/2017  . H/O mitral valve repair [Z98.890] 06/04/2012  . [place] [L89.211, L02.519] 08/22/2010  . RENAL ARTERY STENOSIS [I70.1] 09/07/2009  . ABDOMINAL AORTIC ANEURYSM [I71.4] 09/07/2009  . CEREBROVASCULAR DISEASE [I67.9] 07/27/2009  . CHEST PAIN [R07.9] 07/27/2009  . HYPOKALEMIA [E87.6] 07/26/2009  . CAROTID ARTERY STENOSIS [I65.29] 07/26/2009  . PVD [I73.9] 07/26/2009  . Personal history of surgery to heart and great vessels, presenting hazards to health [Z98.890] 07/03/2006  . ATHEROSCLEROSIS, CORONARY, NATIVE ARTERY [I25.10] 06/20/2006  . HYPERCHOLESTEROLEMIA [E78.00] 01/01/2006  . HYPERTENSION, BENIGN ESSENTIAL [I10] 12/04/2005  . RHINITIS, ALLERGIC NOS [J30.9] 12/04/2005  . CARDIAC FLOW MURMUR [R01.1] 12/04/2005    Total Time spent with patient: 1  hour  Subjective:   [name] is a 84 y.o. female patient admitted with right intertrochanteric fracture.  HPI:   Per chart review, patient was admitted with a right hip fracture after a mechanical fall at home. She has had increased confusion/visual hallucinations for several days. Her hospital course has been complicated by acute hypoxic respiratory failure in the setting of acute on chronic diastolic heart failure. She has been refusing care.   On interview, [name] reports that she did not have a fall or fracture her hip. She does report pain on her right side. She is oriented to person and place. She is unable to state the date. She reports that her sister is her sister-in-law who is visiting and at bedside.  Her husband provides most of her history since she is unable to conduct an interview due to [place].   Past Psychiatric History: Denies   Risk to Self: Is patient at risk for suicide?: No Risk to Others:  None.  Denies HI. Prior Inpatient Therapy:  Denies Prior Outpatient Therapy:  Denies   Past Medical History:  Past Medical History:  Diagnosis Date  . AAA (abdominal aortic aneurysm) ([place])   . Carotid artery stenosis   . Chronic diastolic heart failure ([place])   . Coronary atherosclerosis of native coronary artery   . Dementia   . Hypercholesteremia   . Hypertension, essential, benign   . PVD (peripheral vascular disease) ([place])   . Rhinitis, allergic    NOS    Past Surgical History:  Procedure Laterality Date  . CAROTID ENDARTERECTOMY  03/2008   Right  . COLONOSCOPY  1996   in [place] GRAFT  05/22/2006   Left int mammary to distal left ant descending CA, spahenous vein graft to ramus intermediate branch  . MITRAL VALVE  REPAIR  05/22/2006   Quadrangular resection of post leaflet w/ sliding leaflet annuloplaty and 21m [facility] Physio ring   Family History:  Family History  Problem Relation Age of Onset  . Heart attack Mother    Family  Psychiatric  History: Denies Social History:  Social History   Substance and Sexual Activity  Alcohol Use Not Currently     Social History   Substance and Sexual Activity  Drug Use Never    Social History   Socioeconomic History  . Marital status: Married    Spouse name: Not on file  . Number of children: Not on file  . Years of education: Not on file  . Highest education level: Not on file  Occupational History  . Occupation: Retired from [employer] account executive Social Needs  . Financial resource strain: Not on file  . Food insecurity:    Worry: Not on file    Inability: Not on file  . Transportation needs:    Medical: Not on file    Non-medical: Not on file  Tobacco Use  . Smoking status: Former Smoker    Types: Cigarettes  . Smokeless tobacco: Never Used  Substance and Sexual Activity  . Alcohol use: Not Currently  . Drug use: Never  . Sexual activity: Not Currently  Lifestyle  . Physical activity:    Days per week: Not on file    Minutes per session: Not on file  . Stress: Not on file  Relationships  . Social connections:    Talks on phone: Not on file    Gets together: Not on file    Attends religious service: Not on file    Active member of club or organization: Not on file    Attends meetings of clubs or organizations: Not on file    Relationship status: Not on file  Other Topics Concern  . Not on file  Social History Narrative  . Not on file   Additional Social History: She lives at home with her husband of 542years.  She has one son.  She is retired.  She previously worked with furniture.  She does not use illicit substances or alcohol.    Allergies:  No Known Allergies  Labs:  Results for orders placed or performed during the hospital encounter of 04/22/17 (from the past 48 hour(s))  Basic metabolic panel     Status: Abnormal   Collection Time: 04/22/17 11:20 AM  Result Value Ref Range    Sodium 141 135 - 145 mmol/L   Potassium 4.4 3.5 - 5.1 mmol/L   Chloride 101 101 - 111 mmol/L   CO2 28 22 - 32 mmol/L   Glucose, Bld 117 (H) 65 - 99 mg/dL   BUN 20 6 - 20 mg/dL   Creatinine, Ser 1.03 (H) 0.44 - 1.00 mg/dL   Calcium 9.1 8.9 - 10.3 mg/dL   GFR calc non Af Amer 49 (L) >60 mL/min   GFR calc Af Amer 57 (L) >60 mL/min    Comment: (NOTE) The eGFR has been calculated using the CKD EPI equation. This calculation has not been validated in all clinical situations. eGFR's persistently <60 mL/min signify possible Chronic Kidney Disease.    Anion gap 12 5 - 15    Comment: Performed at [facility] [place] [id number] CBC WITH DIFFERENTIAL     Status: Abnormal  Collection Time: 04/22/17 11:20 AM  Result Value Ref Range   WBC 7.9 4.0 - 10.5 K/uL   RBC 5.45 (H) 3.87 - 5.11 MIL/uL   Hemoglobin 12.0 12.0 - 15.0 g/dL   HCT 44.8 36.0 - 46.0 %   MCV 82.2 78.0 - 100.0 fL   MCH 22.0 (L) 26.0 - 34.0 pg   MCHC 26.8 (L) 30.0 - 36.0 g/dL   RDW 18.3 (H) 11.5 - 15.5 %   Platelets 173 150 - 400 K/uL   Neutrophils Relative % 84 %   Neutro Abs 6.6 1.7 - 7.7 K/uL   Lymphocytes Relative 8 %   Lymphs Abs 0.6 (L) 0.7 - 4.0 K/uL   Monocytes Relative 8 %   Monocytes Absolute 0.6 0.1 - 1.0 K/uL   Eosinophils Relative 0 %   Eosinophils Absolute 0.0 0.0 - 0.7 K/uL   Basophils Relative 0 %   Basophils Absolute 0.0 0.0 - 0.1 K/uL    Comment: Performed at [facility], [address] [place], [place] [address]  Protime-INR     Status: Abnormal   Collection Time: 04/22/17 11:20 AM  Result Value Ref Range   Prothrombin Time 15.4 (H) 11.4 - 15.2 seconds   INR 1.23     Comment: Performed at [facility] [address] [place], [place] [address]  Type and screen [place]     Status: None   Collection Time: 04/22/17 11:20 AM  Result Value Ref Range   ABO/RH(D) A NEG    Antibody Screen NEG    Sample Expiration       04/25/2017 Performed at [facility], [place] [address] [place], [place] [address]   Procalcitonin - Baseline     Status: None   Collection Time: 04/22/17  9:56 PM  Result Value Ref Range   Procalcitonin <0.10 ng/mL    Comment:        Interpretation: PCT (Procalcitonin) <= 0.5 ng/mL: Systemic infection (sepsis) is not likely. Local bacterial infection is possible. (NOTE)       Sepsis PCT Algorithm           Lower Respiratory Tract                                      Infection PCT Algorithm    ----------------------------     ----------------------------         PCT < 0.25 ng/mL                PCT < 0.10 ng/mL         Strongly encourage             Strongly discourage   discontinuation of antibiotics    initiation of antibiotics    ----------------------------     -----------------------------       PCT 0.25 - 0.50 ng/mL            PCT 0.10 - 0.25 ng/mL               OR       >80% decrease in PCT            Discourage initiation of  antibiotics      Encourage discontinuation           of antibiotics    ----------------------------     -----------------------------         PCT >= 0.50 ng/mL              PCT 0.26 - 0.50 ng/mL               AND        <80% decrease in PCT             Encourage initiation of                                             antibiotics       Encourage continuation           of antibiotics    ----------------------------     -----------------------------        PCT >= 0.50 ng/mL                  PCT > 0.50 ng/mL               AND         increase in PCT                  Strongly encourage                                      initiation of antibiotics    Strongly encourage escalation           of antibiotics                                     -----------------------------                                           PCT <= 0.25 ng/mL                                                 OR                                         > 80% decrease in PCT                                     Discontinue / Do not initiate                                             antibiotics Performed at [facility], [place] [address], [place], [place] [address]   Sedimentation rate     Status: None   Collection Time: 04/22/17  9:56 PM  Result Value Ref Range   Sed Rate 3 0 - 22 mm/hr    Comment: Performed at [facility], [place] [address] [place], [place] [address]  Brain natriuretic peptide     Status: Abnormal   Collection Time: 04/22/17  9:56 PM  Result Value Ref Range   B Natriuretic Peptide 3,142.4 (H) 0.0 - 100.0 pg/mL    Comment: Performed at [facility] [address], [place], [place] [address]  TSH     Status: None   Collection Time: 04/22/17  9:56 PM  Result Value Ref Range   TSH 1.686 0.350 - 4.500 uIU/mL    Comment: Performed by a 3rd Generation assay with a functional sensitivity of <=0.01 uIU/mL. Performed at [facility], [place] [address] [place], [place] [address]   Surgical pcr screen     Status: None   Collection Time: 04/23/17  1:07 AM  Result Value Ref Range   MRSA, PCR NEGATIVE NEGATIVE   Staphylococcus aureus NEGATIVE NEGATIVE    Comment: (NOTE) The Xpert SA Assay (FDA approved for NASAL specimens in patients 23 years of age and older), is one component of a comprehensive surveillance program. It is not intended to diagnose infection nor to guide or monitor treatment. Performed at [facility], [place] [address] [place], [place] [address]   Glucose, capillary     Status: Abnormal   Collection Time: 04/23/17  6:35 AM  Result Value Ref Range   Glucose-Capillary 100 (H) 65 - 99 mg/dL  CBC with Differential/Platelet     Status: Abnormal   Collection Time: 04/23/17  8:42 AM  Result Value Ref Range   WBC 9.5 4.0 - 10.5 K/uL   RBC 5.05 3.87 - 5.11 MIL/uL   Hemoglobin 11.2 (L) 12.0 - 15.0 g/dL   HCT 42.0 36.0 - 46.0 %   MCV 83.2 78.0 - 100.0 fL   MCH 22.2 (L)  26.0 - 34.0 pg   MCHC 26.7 (L) 30.0 - 36.0 g/dL   RDW 18.3 (H) 11.5 - 15.5 %   Platelets 153 150 - 400 K/uL   Neutrophils Relative % 87 %   Lymphocytes Relative 4 %   Monocytes Relative 9 %   Eosinophils Relative 0 %   Basophils Relative 0 %   Neutro Abs 8.2 (H) 1.7 - 7.7 K/uL   Lymphs Abs 0.4 (L) 0.7 - 4.0 K/uL   Monocytes Absolute 0.9 0.1 - 1.0 K/uL   Eosinophils Absolute 0.0 0.0 - 0.7 K/uL   Basophils Absolute 0.0 0.0 - 0.1 K/uL   RBC Morphology ELLIPTOCYTES     Comment: POLYCHROMASIA PRESENT Performed at [facility], [address] [place], [place] [address]   Magnesium     Status: None   Collection Time: 04/23/17  8:42 AM  Result Value Ref Range   Magnesium 1.9 1.7 - 2.4 mg/dL    Comment: Performed at [facility] [address] [place], [place] [address]  Lactate dehydrogenase     Status: Abnormal   Collection Time: 04/23/17  9:25 AM  Result Value Ref Range   LDH 666 (H) 98 - 192 U/L    Comment: Performed at [facility], [place] [address] [place], [place] [address]  Hepatic function panel     Status: Abnormal   Collection Time: 04/23/17  9:25 AM  Result Value Ref Range   Total Protein 5.0 (L) 6.5 - 8.1 g/dL  Comment: SPECIMEN HEMOLYZED. HEMOLYSIS MAY AFFECT INTEGRITY OF RESULTS.   Albumin 2.5 (L) 3.5 - 5.0 g/dL   AST 54 (H) 15 - 41 U/L   ALT 17 14 - 54 U/L   Alkaline Phosphatase 35 (L) 38 - 126 U/L   Total Bilirubin 1.9 (H) 0.3 - 1.2 mg/dL   Bilirubin, Direct 0.7 (H) 0.1 - 0.5 mg/dL   Indirect Bilirubin 1.2 (H) 0.3 - 0.9 mg/dL    Comment: Performed at [facility] [address], [place], [place] [address]    Current Facility-Administered Medications  Medication Dose Route Frequency Provider Last Rate Last Dose  . aspirin EC tablet 81 mg  81 mg Oral Daily Samella Parr, NP   81 mg at 04/23/17 1000  . atorvastatin (LIPITOR) tablet 40 mg  40 mg Oral q1800 Samella Parr, NP      . ceFAZolin (ANCEF) IVPB 2g/100 mL premix  2 g  Intravenous On Call to OR Lisette Abu, PA-C      . chlorhexidine (HIBICLENS) 4 % liquid 4 application  60 mL Topical Once Lisette Abu, PA-C      . iopamidol (ISOVUE-300) 61 % injection 100 mL  100 mL Intravenous Once PRN Jola Schmidt, MD      . methocarbamol (ROBAXIN) tablet 500 mg  500 mg Oral Q6H PRN Samella Parr, NP       Or  . methocarbamol (ROBAXIN) 500 mg in dextrose 5 % 50 mL IVPB  500 mg Intravenous Q6H PRN Samella Parr, NP      . metoprolol succinate (TOPROL-XL) 24 hr tablet 100 mg  100 mg Oral Daily Samella Parr, NP      . morphine 4 MG/ML injection 1 mg  1 mg Intravenous Q2H PRN Samella Parr, NP      . morphine 4 MG/ML injection 2 mg  2 mg Intravenous Once Samella Parr, NP      . ondansetron [facility]) injection 4 mg  4 mg Intravenous Once Erin Hearing L, NP      . oxyCODONE (Oxy IR/ROXICODONE) immediate release tablet 5-10 mg  5-10 mg Oral Q4H PRN Samella Parr, NP   5 mg at 04/23/17 0001  . potassium chloride SA (K-DUR,KLOR-CON) CR tablet 20 mEq  20 mEq Oral Daily Erin Hearing L, NP   20 mEq at 04/23/17 1001  . povidone-iodine 10 % swab 2 application  2 application Topical Once Lisette Abu, PA-C        Musculoskeletal: Strength & Muscle Tone: decreased due to physical deconditioning.  Gait & Station: UTA since patient was lying in bed. Patient leans: N/A  Psychiatric Specialty Exam: Physical Exam  Nursing note and vitals reviewed. Constitutional: She appears well-developed.  Thin  HENT:  Head: Normocephalic and atraumatic.  Neck: Normal range of motion.  Respiratory: Effort normal.  Musculoskeletal: Normal range of motion.  Neurological: She is alert.  Oriented to person and place.   Psychiatric: She has a normal mood and affect. Her speech is normal and behavior is normal. Judgment and thought content normal. Cognition and memory are impaired.    Review of Systems  Psychiatric/Behavioral: Negative for hallucinations and  suicidal ideas.  All other systems reviewed and are negative.   Blood pressure 95/72, pulse 90, temperature 98.6 F (37 C), temperature source Oral, resp. rate 18, height _0  (1.575 m), weight 45.9 kg (101 lb 3.1 oz), SpO2 96 %.Body mass index is 18.51 kg/m.  General Appearance: Fairly Groomed, elderly, Caucasian female wearing a hospital gown and lying in bed who frequently falls asleep. NAD.   Eye Contact:  Poor since eyes were mostly closed.   Speech:  Clear and Coherent and Normal Rate  Volume:  Decreased  Mood:  Euthymic  Affect:  Appropriate and Congruent  Thought Process:  Linear and Descriptions of Associations: Intact    Orientation:  Other:  Person and place.  Thought Content:  Logical but at times illogical due to falling asleep.  Suicidal Thoughts:  No  Homicidal Thoughts:  No  Memory:  Immediate;   Poor Recent;   Fair Remote;   Fair  Judgement:  Impaired  Insight:  Lacking  Psychomotor Activity:  Decreased  Concentration:  Concentration: Poor and Attention Span: Poor  Recall:  Poor  Fund of Knowledge:  Fair  Language:  Fair  Akathisia:  No  Handed:  Right  AIMS (if indicated):   N/A  Assets:  Housing Intimacy Social Support  ADL's:  Impaired  Cognition:  Impaired due to AMS.  Sleep:   N/A   Assessment:  [name] is a 84 y.o. female who was admitted with a right intertrochanteric fracture after a mechanical fall. She does not demonstrate capacity to refuse care for her current condition. She is disoriented and is unaware of her current medical condition. She will need an authorized representative to help her make medical decisions.   Treatment Plan Summary: -Patient does not demonstrate capacity to refuse care for her current medical condition. -Psychiatry will sign off on patient at this time. Please consult psychiatry again as needed.   Disposition: No evidence of imminent risk to self or others at present.   Patient does not meet criteria for  psychiatric inpatient admission.  Faythe Dingwall, DO 04/23/2017 1:01 PM

## 2017-04-23 NOTE — Consult Note (Addendum)
The patient has been seen in conjunction with Melissa Demark, PA-C. All aspects of care have been considered and discussed. The patient has been personally interviewed, examined, and all clinical data has been reviewed.   Elderly, frail, admitted with fractured hip related to fall.  History of extremity swelling, dyspnea, weakness, progressive over the past 3-4 weeks.  Sleeping and difficult to arouse..  Denies chest pain.  Confirms dyspnea has been a problem.  Cachectic appearing, elevated neck veins, decreased breath sounds at the right base, 2/6 systolic murmur, 2+ bilateral lower extremity edema.  ECG demonstrates sinus rhythm, LVH, nonspecific T wave flattening, poor R wave progression, and occasional PVCs.  No acute change.  Chest x-ray reveals findings as noted below.  BNP greater than 3000.  Echo with preserved EF and severe pulmonary hypertension with estimated RV systolic pressure 75 mmHg  Suspect acute on chronic diastolic heart failure.  Severe pulmonary hypertension with WHO classification Group 2.  High risk for general anesthesia and surgery until heart failure is better controlled.  Elevated troponin due to demand ischemia.  Recommend begin diuresis with furosemide 40 mg IV twice daily.  Bilateral lower extremity venous study to rule out DVT.  Subcu Lovenox for DVT prevention.  Consider chest CT to rule out PE if clinical response is not compatible with heart failure diagnosis.  Hold off on surgery until heart failure is compensated.  Cardiology Consultation:   Patient ID: [name]; [id number]; 07-Sep-1932   Admit date: 04/22/2017 Date of Consult: 04/23/2017  Primary Care Provider: No primary care provider on file. Primary Cardiologist: Dr [name], 01/26/2015 Primary Electrophysiologist:  n/a   Patient Profile:   [name] is a 84 y.o. female with a hx of CABG w/ LIMA-LAD, SVG-RI & MV repair 2008, PFO closure at that time, CEA, HTN, HLD, AAA < 3.5 cm, who is  being seen today for the evaluation of SOB and elevated troponin at the request of Dr [name]. Her husband and sister are present.  History of Present Illness:   [name] c/o DOE, it has been worse than usual, getting bad for the last year.   She has had some chest pain, a pressure. 911 was called, she was checked and they did not transport.   Family states that her activity level is very poor. They deny orthopnea or PND.  Pt states she is not waking up in the night. She states she can do things that the family states she cannot do. She says she can do housework. Husband says she gets SOB walking through the house.   She has had LE edema for a while, timing unclear. She has not been gaining weight.  She fell yesterday, walking from the den to the kitchen. She was carrying a cup of coffee. Her husband heard her fall, but did not see it. She was alert and oriented when he went to her. She has a hip fx, needs repair.   CXR shows pleural effusions and her O2 sats are low, requiring 3 lpm to keep them > 90%. Troponins are being cycled and the first one was elevated at 0.41. BNP elevated at 3412.4.   Past Medical History:  Diagnosis Date  . AAA (abdominal aortic aneurysm) (HCC)   . Carotid artery stenosis   . Chronic diastolic heart failure (HCC)   . Coronary atherosclerosis of native coronary artery   . Dementia   . Hypercholesteremia   . Hypertension, essential, benign   . PVD (peripheral vascular  disease) (HCC)   . Rhinitis, allergic    NOS    Past Surgical History:  Procedure Laterality Date  . CAROTID ENDARTERECTOMY  03/2008   Right  . COLONOSCOPY  1996   in [place]  . CORONARY ARTERY BYPASS GRAFT  05/22/2006   Left int mammary to distal left ant descending CA, spahenous vein graft to ramus intermediate branch  . MITRAL VALVE REPAIR  05/22/2006   Quadrangular resection of post leaflet w/ sliding leaflet annuloplaty and 24mm Edwards Physio ring   Prior to Admission medications    Medication Sig Start Date End Date Taking? Authorizing Provider  amLODipine (NORVASC) 5 MG tablet TAKE 1 TABLET (5 MG TOTAL) BY MOUTH DAILY. 02/13/16  Yes Melissa Bunting, MD  aspirin 81 MG tablet Take 81 mg by mouth daily.     Yes [provider]  atorvastatin (LIPITOR) 40 MG tablet Take 1 tablet (40 mg total) by mouth daily at 6 PM. NEED OV. 01/07/17  Yes Crenshaw, [name], MD  enalapril (VASOTEC) 20 MG tablet TAKE 1 TABLET (20 MG TOTAL) BY MOUTH 2 (TWO) TIMES DAILY. NEED OV. Patient taking differently: Take 20 mg by mouth daily.  04/01/17  Yes Melissa Bunting, MD  furosemide (LASIX) 40 MG tablet TAKE 1 TABLET BY MOUTH EVERY DAY Patient taking differently: 40mg  by mouth daily as needed for fluid 03/23/16  Yes Crenshaw, [name], MD  KLOR-CON M20 20 MEQ tablet TAKE 1 TABLET EVERY DAY -MUST MAKE APPOINTMENT FOR FURTHER REFILLS 07/10/16  Yes Melissa Bunting, MD  metoprolol succinate (TOPROL-XL) 100 MG 24 hr tablet TAKE 1 TABLET (100 MG TOTAL) BY MOUTH DAILY. NEED OV. 04/01/17  Yes Melissa Bunting, MD    Inpatient Medications: Scheduled Meds: . aspirin EC  81 mg Oral Daily  . atorvastatin  40 mg Oral q1800  . chlorhexidine  60 mL Topical Once  . metoprolol succinate  100 mg Oral Daily  .  morphine injection  2 mg Intravenous Once  . ondansetron (ZOFRAN) IV  4 mg Intravenous Once  . potassium chloride SA  20 mEq Oral Daily  . povidone-iodine  2 application Topical Once   Continuous Infusions: .  ceFAZolin (ANCEF) IV    . methocarbamol (ROBAXIN)  IV     PRN Meds: iopamidol, methocarbamol **OR** methocarbamol (ROBAXIN)  IV, morphine injection, oxyCODONE  Allergies:   No Known Allergies  Social History:   Social History   Socioeconomic History  . Marital status: Married    Spouse name: Not on file  . Number of children: Not on file  . Years of education: Not on file  . Highest education level: Not on file  Occupational History  . Occupation: Retired from [employer]  in [place] and [employer]: OTHER    Comment: Film/video editor  Social Needs  . Financial resource strain: Not on file  . Food insecurity:    Worry: Not on file    Inability: Not on file  . Transportation needs:    Medical: Not on file    Non-medical: Not on file  Tobacco Use  . Smoking status: Former Smoker    Types: Cigarettes    Last attempt to quit: 01/15/2006    Years since quitting: 11.2  . Smokeless tobacco: Never Used  Substance and Sexual Activity  . Alcohol use: Not Currently  . Drug use: Never  . Sexual activity: Not Currently  Lifestyle  . Physical activity:    Days per  week: Not on file    Minutes per session: Not on file  . Stress: Not on file  Relationships  . Social connections:    Talks on phone: Not on file    Gets together: Not on file    Attends religious service: Not on file    Active member of club or organization: Not on file    Attends meetings of clubs or organizations: Not on file    Relationship status: Not on file  . Intimate partner violence:    Fear of current or ex partner: Not on file    Emotionally abused: Not on file    Physically abused: Not on file    Forced sexual activity: Not on file  Other Topics Concern  . Not on file  Social History Narrative   Lives in [place], with husband.    Family History:   Family History  Problem Relation Age of Onset  . Heart attack Mother    Family Status:  Family Status  Relation Name Status  . Mother  Deceased  . Father  Deceased  . Sister  Alive  . MGM  Deceased  . MGF  Deceased  . PGM  Deceased  . PGF  Deceased    ROS:  Please see the history of present illness.  All other ROS reviewed and negative.     Physical Exam/Data:   Vitals:   04/23/17 0645 04/23/17 0800 04/23/17 1110 04/23/17 1344  BP: 93/65 92/62 95/72  125/75  Pulse:  96 90 79  Resp:   18   Temp:  (!) 97.4 F (36.3 C) 98.6 F (37 C) 98.1 F (36.7 C)  TempSrc:   Oral Oral  SpO2:  100% 96% (!) 84%  Weight:       Height:        Intake/Output Summary (Last 24 hours) at 04/23/2017 1839 Last data filed at 04/23/2017 1057 Gross per 24 hour  Intake 200 ml  Output 1275 ml  Net -1075 ml   Filed Weights   04/22/17 1546  Weight: 101 lb 3.1 oz (45.9 kg)   Body mass index is 18.51 kg/m.  General:  Elderly, cachectic female, in respiratory distress with any activity HEENT: normal Lymph: no adenopathy Neck: JVD 10 cm Endocrine:  No thryomegaly Vascular: No carotid bruits; upper extremity pulses 2+, LE pulses decreased due to edema Cardiac:  normal S1, S2; RRR; soft murmur  Lungs:  Decreased BS bases bilaterally, no wheezing, rhonchi or rales  Abd: soft, nontender, no hepatomegaly  Ext: 2+ LE edema Musculoskeletal:  +RLE deformity, BUE strength weak but equal Skin: warm and dry  Neuro:  CNs 2-12 intact, no focal abnormalities noted  Psych:  Normal affect   EKG:  The EKG was personally reviewed and demonstrates:  04/08, SR, LVH, HR 74, PVC Telemetry:  Telemetry was personally reviewed and demonstrates:  Not on  Relevant CV Studies:  ECHO: 04/23/2017 - Left ventricle: The cavity size was normal. Wall thickness was   normal. Systolic function was normal. The estimated ejection   fraction was in the range of 60% to 65%. Images were inadequate   for LV wall motion assessment. - Aortic valve: There was trivial regurgitation. - Mitral valve: s/p annuloplasty repair. No significant stenosis.   Mild regurgitation. Valve area by continuity equation (using LVOT   flow): 1.68 cm^2. - Left atrium: The atrium was normal in size. - Right ventricle: The cavity size was mildly dilated. Moderately   reduced RV systolic function. -  Right atrium: The atrium was normal in size. - Tricuspid valve: There was moderate regurgitation. - Pulmonary arteries: PA peak pressure: 75 mm Hg (S). - Inferior vena cava: The vessel was dilated. The respirophasic   diameter changes were blunted (< 50%), consistent with  elevated   central venous pressure. - Pericardium, extracardiac: Ascites is noted. Impressions: - Compared to a study in 2014, the LVEF is higher at 60-65%. The   RVSP is much higher at 75 mmHg with moderate TR. There is no   significant MV stenosis and mild regurgitation. Ascites is noted.  CATH: none since CABG 2008 05/17/2006 CORONARY ANGIOGRAPHY:  The left mainstem has ostial stenosis.  Angiographically it is difficult to appreciate the stenosis but there  was a large pressure gradient and the patient experienced chest pain and  ventricular ectopy with the 4-French catheter in place.  I think there  is at least a 50% ostial left mainstem stenosis present.  The left  mainstem trifurcates into the LAD, intermediate branch and left  circumflex.  There is heavy calcification involving the left mainstem as  well as the LAD.  The LAD has a 25% proximal stenosis that courses down  and supplies the LV apex.  The remainder of the vessel has no  significant angiographic disease.  There are two small diagonal branches  present.   The intermediate branch has mild calcification but is otherwise  angiographically normal.  It is a large caliber ramus intermedius.   The left circumflex branch is large caliber.  There is proximal  calcification present.  There is no significant angiographic disease.  The circumflex supplies two posterolateral branches and the circumflex  system has no significant disease.   The right coronary artery is mildly calcified.  There is a 25% proximal  stenosis present.  There is no obstructive disease.  There are two RV  marginal branches present.  Distally the right coronary artery  terminates in a posterior descending branch and posterior AV segment  that supplies one posterolateral.  There is no flow obstructive disease  seen throughout the right coronary artery.  ASSESSMENT:  1. Severe mitral regurgitation.  2. Significant left mainstem disease.  3.  Pulmonary hypertension most likely attributable to mitral      regurgitation.  PLAN:  The patient is already been seen by Dr. Cornelius Moras.  She is scheduled  for surgery next week.  She will likely require mitral valve repair plus  coronary bypass surgery.   Laboratory Data:  Chemistry Recent Labs  Lab 04/22/17 1120 04/23/17 1453  NA 141 140  K 4.4 5.0  CL 101 103  CO2 28 25  GLUCOSE 117* 100*  BUN 20 24*  CREATININE 1.03* 0.87  CALCIUM 9.1 8.6*  GFRNONAA 49* >60  GFRAA 57* >60  ANIONGAP 12 12    Lab Results  Component Value Date   ALT 17 04/23/2017   AST 54 (H) 04/23/2017   ALKPHOS 35 (L) 04/23/2017   BILITOT 1.9 (H) 04/23/2017   Hematology Recent Labs  Lab 04/22/17 1120 04/23/17 0842  WBC 7.9 9.5  RBC 5.45* 5.05  HGB 12.0 11.2*  HCT 44.8 42.0  MCV 82.2 83.2  MCH 22.0* 22.2*  MCHC 26.8* 26.7*  RDW 18.3* 18.3*  PLT 173 153   Cardiac Enzymes Recent Labs  Lab 04/23/17 1453  TROPONINI 0.42*     BNP Recent Labs  Lab 04/22/17 2156  BNP 3,142.4*    TSH:  Lab Results  Component Value Date  TSH 1.686 04/22/2017   Lipids: Lab Results  Component Value Date   CHOL 137 10/06/2013   HDL 56 10/06/2013   LDLCALC 65 10/06/2013   TRIG 81 10/06/2013   CHOLHDL 2.4 10/06/2013   HgbA1c:No results found for: HGBA1C Magnesium:  Magnesium  Date Value Ref Range Status  04/23/2017 1.9 1.7 - 2.4 mg/dL Final    Comment:    Performed at [facility], [address] [place], [place] [address]     Radiology/Studies:  Dg Chest 1 View  Result Date: 04/22/2017 CLINICAL DATA:  Preop evaluation for right hip surgery EXAM: CHEST  1 VIEW COMPARISON:  04/05/2008 FINDINGS: Cardiac shadow remains enlarged. Postsurgical changes are again seen. Right-sided pleural effusion and atelectatic/infiltrative changes are noted. No pneumothorax is seen. No acute bony abnormality is noted. IMPRESSION: Right basilar atelectatic/infiltrative changes with associated effusion.  Electronically Signed   By: Alcide Clever M.D.   On: 04/22/2017 11:56   Dg Chest Port 1 View  Result Date: 04/23/2017 CLINICAL DATA:  84 year old female with hypoxia. Subsequent encounter. EXAM: PORTABLE CHEST 1 VIEW COMPARISON:  04/22/2017 chest x-ray. FINDINGS: Post CABG and valve replacement.  Cardiomegaly. Progressive enlargement of right-sided pleural effusion now moderate to large in size with associated right lung atelectasis. Pulmonary vascular congestion. Medial left base subsegmental atelectasis. Calcified tortuous aorta.  Prominent vascular calcifications. IMPRESSION: Progressive enlargement of right-sided pleural effusion now moderate to large in size with associated right lung atelectasis. Mild atelectasis medial aspect left lung base. Pulmonary vascular congestion. Cardiomegaly post CABG and valve replacement. Aortic Atherosclerosis (ICD10-I70.0). Electronically Signed   By: Lacy Duverney M.D.   On: 04/23/2017 08:45   Dg Hip Unilat W Or Wo Pelvis 2-3 Views Right  Result Date: 04/22/2017 CLINICAL DATA:  Recent fall with right leg deformity EXAM: DG HIP (WITH OR WITHOUT PELVIS) 2-3V RIGHT COMPARISON:  None. FINDINGS: Pelvic ring is intact. Comminuted intratrochanteric fracture of the proximal right femur is noted with impaction and angulation at the fracture site. No soft tissue changes are seen. Diffuse vascular calcifications are noted. IMPRESSION: Comminuted intratrochanteric fracture of the proximal right femur. Electronically Signed   By: Alcide Clever M.D.   On: 04/22/2017 12:01    Assessment and Plan:   1. Acute on chronic diastolic CHF:  - needs diuresis - too SOB to have surgery right now - Lasix 40 mg IV BID - daily BMET - she has risk factors for PE/DVT>>LE Dopplers. - However, this may be all CHF so will diurese and consider CTA chest if SOB does not improve. - needs DVT prophylaxis, will order DVT Lovenox  Otherwise, per IM/Ortho Principal Problem:   Evaluation by  psychiatric service required Active Problems:   Closed comminuted intertrochanteric fracture of proximal femur, right, initial encounter (HCC)   Hypertension, essential, benign   Acute on chronic diastolic (congestive) heart failure (HCC)   Hypercholesteremia   PVD (peripheral vascular disease) (HCC)   Dementia   AAA (abdominal aortic aneurysm) (HCC)   Acute respiratory failure with hypoxia (HCC)   CKD (chronic kidney disease), stage III (HCC)   Carotid artery stenosis   Pulmonary hypertension (HCC)   Former tobacco use   CAD (coronary artery disease)-prior CABG 2008   Pleural effusion, right   Preoperative respiratory examination     For questions or updates, please contact [facility] Please consult [email or web address] for contact info under Cardiology/STEMI.   Melida Quitter, PA-C  04/23/2017 6:39 PM

## 2017-04-23 NOTE — Progress Notes (Signed)
Called by Nt this AM that Pt Bp was 83/62. Less responsive compare from last night. Able to follow simple command like hand squeezes. Confused, pupils reactive to light. No urine output since last night.Bladder scan shows >500 in the bladder. Called RR Rn to further evaluate pt. C. Bodenheimer notified. 500cc NS bolus given and foley cath placed as ordered. Will continue to monitor pt.

## 2017-04-23 NOTE — Consult Note (Signed)
Name: [name] MRN: [id number] DOB: 05/03/1932    ADMISSION DATE:  04/22/2017 CONSULTATION DATE:  04/23/2017  REFERRING MD :  Dr. Janee Mornhompson  CHIEF COMPLAINT:  Hypoxia/ R pleural effusion  HISTORY OF PRESENT ILLNESS:   HPI obtained from medical chart review as patient is confused and not able to given accurate history.  84 year old female with PMH significant for but not limited to former smoker, diastolic HF, CAD s/p MVR and CABG 2008, AAA, HTN, HLD, PVD and questionable dementia who [facility] with right intertrochanteric fracture.  On exam, patient is oriented to herself and place however clearly confused, unable to explain why she is hospitalized, denies being married, and is pleasant and inappropriate in her responses.  No family at bedside.    Patient is from home, lives with her husband.  Apparently had mechanical fall yesterday with resultant right hip pain found to have right intertrochanteric fracture.  Family reports several days of confusion, visual hallucinations involving her cat, and worsening edema to lower extremities.  Questionable if patient is compliant with home medications including diuretics.  Additionally, patient was hypoxic initially requiring O2 found to have large right pleural effusion, saturating 100% on 2L.  Labs noted for BNP 3142, negative PCT, Tmax 99.3, normal WBC.  Patient has been refusing most care, including surgery, and her competency in making decisions is pending psych evaluation.  There is no established HPOA.   Additionally husband has been having difficulty in guiding decision making and [place] of [place].    Overnight, there was some concern for decreased responsiveness however exam nonfocal, and found to  blood pressure of 83/62, requiring a 500 NS bolus to improve systolic BP; MAPs remained normal.  On chart review, the only medicine patient has agreed, was 5mg  oxycodone overnight.  Hgb trended from 12 _> 11.2.   BNP  3142, procalcitonin normal.  There was some urinary retention requiring insertion of foley.  UA pending.  PCCM consulted for further assistance.   PAST MEDICAL HISTORY :   has a past medical history of AAA (abdominal aortic aneurysm) (HCC), Carotid artery stenosis, Chronic diastolic heart failure (HCC), Coronary atherosclerosis of native coronary artery, Dementia, Hypercholesteremia, Hypertension, essential, benign, PVD (peripheral vascular disease) (HCC), and Rhinitis, allergic.  has a past surgical history that includes Mitral valve repair (05/22/2006); Coronary artery bypass graft (05/22/2006); Carotid endarterectomy (03/2008); and Colonoscopy (1996). Prior to Admission medications   Medication Sig Start Date End Date Taking? Authorizing Provider  amLODipine (NORVASC) 5 MG tablet TAKE 1 TABLET (5 MG TOTAL) BY MOUTH DAILY. 02/13/16  Yes Lewayne Buntingrenshaw, Brian S, MD  aspirin 81 MG tablet Take 81 mg by mouth daily.     Yes [provider]  atorvastatin (LIPITOR) 40 MG tablet Take 1 tablet (40 mg total) by mouth daily at 6 PM. NEED OV. 01/07/17  Yes Crenshaw, Madolyn FriezeBrian S, MD  enalapril (VASOTEC) 20 MG tablet TAKE 1 TABLET (20 MG TOTAL) BY MOUTH 2 (TWO) TIMES DAILY. NEED OV. Patient taking differently: Take 20 mg by mouth daily.  04/01/17  Yes Lewayne Buntingrenshaw, Brian S, MD  furosemide (LASIX) 40 MG tablet TAKE 1 TABLET BY MOUTH EVERY DAY Patient taking differently: 40mg  by mouth daily as needed for fluid 03/23/16  Yes Crenshaw, Madolyn FriezeBrian S, MD  KLOR-CON M20 20 MEQ tablet TAKE 1 TABLET EVERY DAY -MUST MAKE APPOINTMENT FOR FURTHER REFILLS 07/10/16  Yes Lewayne Buntingrenshaw, Brian S, MD  metoprolol succinate (TOPROL-XL) 100 MG  24 hr tablet TAKE 1 TABLET (100 MG TOTAL) BY MOUTH DAILY. NEED OV. 04/01/17  Yes Crenshaw, [name], MD   No Known Allergies  FAMILY HISTORY:  family history includes Heart attack in her mother. SOCIAL HISTORY:  reports that she has quit smoking. Her smoking use included cigarettes. She has never used  smokeless tobacco. She reports that she drank alcohol. She reports that she does not use drugs.  REVIEW OF SYSTEMS:   Unable to obtain as patient is unable to provide based on acute, questionably chronic confusion.  SUBJECTIVE:  Patient without complaints.  Denies SOB or pain.  VITAL SIGNS: Temp:  [97.7 F (36.5 C)-99.3 F (37.4 C)] 97.7 F (36.5 C) (04/09 0624) Pulse Rate:  [69-96] 96 (04/09 0800) Resp:  [15-30] 16 (04/09 0624) BP: (83-151)/(62-107) 92/62 (04/09 0800) SpO2:  [77 %-100 %] 100 % (04/09 0800) Weight:  [101 lb 3.1 oz (45.9 kg)] 101 lb 3.1 oz (45.9 kg) (04/08 1546)  PHYSICAL EXAMINATION: General:  Frail, thin, elderly female lying in bed in NAD HEENT: MM pink/dry, pupils 3/ reactive  Neuro: intermittently f/c CV: RRR, +S4 PULM: even/non-labored, normal WOB,  lungs bilaterally clear, diminished right base, no rales appreciated.   GI: soft, non-tender, bs active  Extremities: warm/dry, BLE 3+ edema, Right leg shortened and internally rotated Skin: no rashes  Recent Labs  Lab 04/22/17 1120  NA 141  K 4.4  CL 101  CO2 28  BUN 20  CREATININE 1.03*  GLUCOSE 117*   Recent Labs  Lab 04/22/17 1120 04/23/17 0842  HGB 12.0 11.2*  HCT 44.8 42.0  WBC 7.9 9.5  PLT 173 PENDING   Dg Chest 1 View  Result Date: 04/22/2017 CLINICAL DATA:  Preop evaluation for right hip surgery EXAM: CHEST  1 VIEW COMPARISON:  04/05/2008 FINDINGS: Cardiac shadow remains enlarged. Postsurgical changes are again seen. Right-sided pleural effusion and atelectatic/infiltrative changes are noted. No pneumothorax is seen. No acute bony abnormality is noted. IMPRESSION: Right basilar atelectatic/infiltrative changes with associated effusion. Electronically Signed   By: Alcide Clever M.D.   On: 04/22/2017 11:56   Dg Chest Port 1 View  Result Date: 04/23/2017 CLINICAL DATA:  84 year old female with hypoxia. Subsequent encounter. EXAM: PORTABLE CHEST 1 VIEW COMPARISON:  04/22/2017 chest x-ray.  FINDINGS: Post CABG and valve replacement.  Cardiomegaly. Progressive enlargement of right-sided pleural effusion now moderate to large in size with associated right lung atelectasis. Pulmonary vascular congestion. Medial left base subsegmental atelectasis. Calcified tortuous aorta.  Prominent vascular calcifications. IMPRESSION: Progressive enlargement of right-sided pleural effusion now moderate to large in size with associated right lung atelectasis. Mild atelectasis medial aspect left lung base. Pulmonary vascular congestion. Cardiomegaly post CABG and valve replacement. Aortic Atherosclerosis (ICD10-I70.0). Electronically Signed   By: Lacy Duverney M.D.   On: 04/23/2017 08:45   Dg Hip Unilat W Or Wo Pelvis 2-3 Views Right  Result Date: 04/22/2017 CLINICAL DATA:  Recent fall with right leg deformity EXAM: DG HIP (WITH OR WITHOUT PELVIS) 2-3V RIGHT COMPARISON:  None. FINDINGS: Pelvic ring is intact. Comminuted intratrochanteric fracture of the proximal right femur is noted with impaction and angulation at the fracture site. No soft tissue changes are seen. Diffuse vascular calcifications are noted. IMPRESSION: Comminuted intratrochanteric fracture of the proximal right femur. Electronically Signed   By: Alcide Clever M.D.   On: 04/22/2017 12:01    SIGNIFICANT EVENTS  4/8 Admit  STUDIES:  CXR 4/8 hip >> Comminuted intratrochanteric fracture of the proximal right femur.  CXR 4/9 >> Progressive enlargement of right-sided pleural effusion now moderate to large in size with associated right lung atelectasis.  Mild atelectasis medial aspect left lung base.  Pulmonary vascular congestion.  Cardiomegaly post CABG and valve replacement   BRIEF PATIENT DESCRIPTION:  57 yoF with likely baseline dementia with mechanical fall at home and increased confusion/ visual hallucinations for several days in with right hip fx.  Pt has been refusing all care, husband unable to guide therapy, no HPOA, son of country.   Pending Psych eval for competency.  Patient has been hypoxic with mod right pleural effusion and with mild hypotension this am.    ASSESSMENT / PLAN:  Acute hypoxic respiratory failure- likely in the setting of acute on chronic diastolic HF in the setting of question compliance with home meds, BNP 3142, R pleural effusion.  PCT negative, no suggestive symptoms of infectious process - currently she is maintaining on minimal O2 requirements with no significant increase in WOB P:  Wean supplemental O2 as needed Consider diagnostic /therapeutic thoracentesis at some point, not urgent at this time, however patient has refused most care, will have to wait till competency is established.   Consider gentle diuresis as BP allows, given patient's size, would base off MAPs rather than systolic Consider further input with cardiology and repeat TTE (last 2014)  Borderline Hypotension (MAP still acceptable)- unclear her baseline BP Questionable acute on chronic worsening encephalopathy (hx dementia)  P:  Assess manual BP UA pending, which could contribute to her confusion Assess glucose now to rule out hypoglycemia, then q 4 while NPO Trend Hgb to rule out ABLA Would hold home BP medications at this point and caution with pain medications (only 5mg  of oxy thus far should have not contributed to her hypotension which is ) Psych to evaluate for competency, although she is clearly confused on exam, and further HPOA to be determined   R intertochanteric hip fracture  P: Per Ortho and primary   Attending to follow with further suggestions  Posey Boyer, AGACNP-BC [facility] Pgr: [phone] or if no answer [phone] 04/23/2017, 10:52 AM

## 2017-04-23 NOTE — Significant Event (Addendum)
Rapid Response Event Note  Overview: Neurologic  Initial Focused Assessment: Called by RN about patient not being as responsive this morning. When I arrived, patient was awake, she stated her name and when I asked her where she was, she stated the hospital.  Patient moves all extremities to pain and to simple commands (squeezes hands/wiggles toes), speech is soft spoken and she is confused to time and situation. Pupils are 3 in size, brisk and reactive. Her morning vitals revealed a SBP in the 80s and patient is experiencing acute urinary retention. Lung sounds were clear, good air movement, sats 100% on 2L, HR in the 80s. RN [facility], received orders for 500cc NS bolus and Foley catheter placement. Blood sugar was normal. When asked if she hurts, she stated yes. Patient is s/p R hip fracture and is pending surgery. + 3 pitting edema BLE.  Interventions: -- NS 500 cc is infusing, BP 93/65 (74).  Plan of Care: -- I [facility], I was not able to appreciate any focal neurologic deficits and given the patient's history of dementia, she was not able to follow complex commands to assess for ataxia or neglect. The patient is pending a psychiatric consultation as well.  -- I requested that [facility] MD on day shift see the patient this morning.  Event Summary:   at    Call Time 635 End Time 710  Dayson Aboud R

## 2017-04-23 NOTE — Clinical Social Work Note (Signed)
Clinical Social Work Assessment  Patient Details  Name: [name] MRN: [id number] Date of Birth: [birth year]  Date of referral:  04/23/17               Reason for consult:  Facility Placement, Discharge Planning                Permission sought to share information with:  Facility Sport and exercise psychologist, Family Supports Permission granted to share information::  Yes, Verbal Permission Granted  Name::      Jadalyn Oliveri  Agency::     Relationship::   husband  Contact Information:     Housing/Transportation Living arrangements for the past 2 months:  Single Family Home Source of Information:  Engineer, materials, Spouse Patient Interpreter Needed:  None Criminal Activity/Legal Involvement Pertinent to Current Situation/Hospitalization:  No - Comment as needed Significant Relationships:  Friend, Neighbor, Adult Children, Spouse Lives with:  Spouse Do you feel safe going back to the place where you live?  Yes Need for family participation in patient care:  Yes (Comment)(decision making; care post surgery)  Care giving concerns: Pt lives with husband and has had a fall. Pt not fully oriented, requires support with decision making and supervision for safety. Pt husband has medically conditions that make it harder for him to appropriately support all of pt needs.    Social Worker assessment / plan: CSW met with pt and pt husband at bedside, pt stirred but did not wake for CSW assessment. Pt neighbor also at bedside and participated in assessment. Pt is not fully oriented per notes and psych assessment deemed that she needed her husband to support with decision making. Pt and pt husband live together at home, and have a son that visits but according to pt husband is not active in their care. Pt husband states that they get support from their neighbor as well as siblings and friends. Pt husband aware that pt will likely require SNF stay and further therapies. CSW will f/u post surgery with PT/OT  recommendations.   Pt PASSR pending, will submit clinicals for review and continue to follow.   Employment status:  Retired Forensic scientist:  Medicare PT Recommendations:  Not assessed at this time Information / Referral to community resources:  [place]  Patient/Family's Response to care: Pt husband thanked CSW for visit, expresses understanding of role and recommendations.   Patient/Family's Understanding of and Emotional Response to Diagnosis, Current Treatment, and Prognosis:  Pt husband states understanding of diagnosis, current treatment, and prognosis. Pt husband states that he knows pt will likely need more help than he can provide, but is optimistic for pt to have surgery and eventually come home.   Emotional Assessment Appearance:  Appears stated age, Other (Comment Required(did not arouse for CSW visit) Attitude/Demeanor/Rapport:  Unable to Assess Affect (typically observed):  Unable to Assess Orientation:  Fluctuating Orientation (Suspected and/or reported Sundowners)(per RN assessment) Alcohol / Substance use:  Not Applicable Psych involvement (Current and /or in the community):  No (Comment)  Discharge Needs  Concerns to be addressed:  Cognitive Concerns, Care Coordination, Discharge Planning Concerns Readmission within the last 30 days:  No Current discharge risk:  Physical Impairment, Cognitively Impaired, Dependent with Mobility Barriers to Discharge:  Continued Medical Work up, [employer], [place] 04/23/2017, 3:32 PM

## 2017-04-23 NOTE — Progress Notes (Signed)
ANTICOAGULATION CONSULT NOTE - Initial Consult  Pharmacy Consult for lovenox Indication: VTE prophylaxis  No Known Allergies  Patient Measurements: Height: 5\' 2"  (157.5 cm) Weight: 101 lb 3.1 oz (45.9 kg) IBW/kg (Calculated) : 50.1 Dosing Weight: 45.9 kg  Vital Signs: Temp: 98.1 F (36.7 C) (04/09 1344) Temp Source: Oral (04/09 1344) BP: 125/75 (04/09 1344) Pulse Rate: 79 (04/09 1344)  Labs: Recent Labs    04/22/17 1120 04/23/17 0842 04/23/17 1453  HGB 12.0 11.2*  --   HCT 44.8 42.0  --   PLT 173 153  --   LABPROT 15.4*  --   --   INR 1.23  --   --   CREATININE 1.03*  --  0.87  TROPONINI  --   --  0.42*    Estimated Creatinine Clearance: 36.1 mL/min (by C-G formula based on SCr of 0.87 mg/dL).   Medical History: Past Medical History:  Diagnosis Date  . AAA (abdominal aortic aneurysm) (HCC)   . Carotid artery stenosis   . Chronic diastolic heart failure (HCC)   . Coronary atherosclerosis of native coronary artery   . Dementia   . Hypercholesteremia   . Hypertension, essential, benign   . PVD (peripheral vascular disease) (HCC)   . Rhinitis, allergic    NOS    Medications:  Medications Prior to Admission  Medication Sig Dispense Refill Last Dose  . amLODipine (NORVASC) 5 MG tablet TAKE 1 TABLET (5 MG TOTAL) BY MOUTH DAILY. 90 tablet 3 04/22/2017 at Unknown time  . aspirin 81 MG tablet Take 81 mg by mouth daily.     04/22/2017 at Unknown time  . atorvastatin (LIPITOR) 40 MG tablet Take 1 tablet (40 mg total) by mouth daily at 6 PM. NEED OV. 7 tablet 0 04/21/2017 at Unknown time  . enalapril (VASOTEC) 20 MG tablet TAKE 1 TABLET (20 MG TOTAL) BY MOUTH 2 (TWO) TIMES DAILY. NEED OV. (Patient taking differently: Take 20 mg by mouth daily. ) 30 tablet 0 04/22/2017 at Unknown time  . furosemide (LASIX) 40 MG tablet TAKE 1 TABLET BY MOUTH EVERY DAY (Patient taking differently: 40mg  by mouth daily as needed for fluid) 90 tablet 3 prn at prn  . KLOR-CON M20 20 MEQ tablet TAKE  1 TABLET EVERY DAY -MUST MAKE APPOINTMENT FOR FURTHER REFILLS 30 tablet 0 04/22/2017 at Unknown time  . metoprolol succinate (TOPROL-XL) 100 MG 24 hr tablet TAKE 1 TABLET (100 MG TOTAL) BY MOUTH DAILY. NEED OV. 15 tablet 0 04/22/2017 at 0700    Assessment: 84 yo lady to start lovenox for VTE prophylaxis.   Goal of Therapy:  Prevention of VTE Monitor platelets by anticoagulation protocol: Yes   Plan:  Lovenox 30 mg sq q24 hours Pharmacy will sign off.  Please reconsult if needed  Woodfin GanjaSeay, Sheranda Seabrooks [place] 04/23/2017,7:11 PM

## 2017-04-23 NOTE — Social Work (Signed)
CSW acknowledging consult for SNF, will need PT/OT evalutions.   CSW continuing to follow.  Doy HutchingIsabel H Amyrie Illingworth, LCSWA [facility] Clinical Social Work [phone]

## 2017-04-24 ENCOUNTER — Encounter (HOSPITAL_COMMUNITY): Payer: Self-pay | Admitting: Certified Registered Nurse Anesthetist

## 2017-04-24 ENCOUNTER — Encounter (HOSPITAL_COMMUNITY)
Admission: EM | Disposition: A | Discharge status: Skilled Nursing Facility | Payer: Self-pay | Source: Home / Self Care | Attending: Pulmonary Disease

## 2017-04-24 ENCOUNTER — Inpatient Hospital Stay (HOSPITAL_COMMUNITY): Payer: Medicare Other

## 2017-04-24 DIAGNOSIS — Z87891 Personal history of nicotine dependence: Secondary | ICD-10-CM

## 2017-04-24 DIAGNOSIS — I739 Peripheral vascular disease, unspecified: Secondary | ICD-10-CM

## 2017-04-24 DIAGNOSIS — R609 Edema, unspecified: Secondary | ICD-10-CM

## 2017-04-24 LAB — BASIC METABOLIC PANEL
Anion gap: 9 (ref 5–15)
BUN: 20 mg/dL (ref 6–20)
CO2: 33 mmol/L — ABNORMAL HIGH (ref 22–32)
Calcium: 8.7 mg/dL — ABNORMAL LOW (ref 8.9–10.3)
Chloride: 100 mmol/L — ABNORMAL LOW (ref 101–111)
Creatinine, Ser: 0.84 mg/dL (ref 0.44–1.00)
GFR calc Af Amer: >60 mL/min (ref >60)
GFR calc non Af Amer: >60 mL/min (ref >60)
GLUCOSE: 83 mg/dL (ref 65–99)
Potassium: 4.9 mmol/L (ref 3.5–5.1)
SODIUM: 142 mmol/L (ref 135–145)

## 2017-04-24 LAB — CBC
HCT: 37.7 % (ref 36.0–46.0)
HEMATOCRIT: 36.3 % (ref 36.0–46.0)
HEMOGLOBIN: 9.9 g/dL — AB (ref 12.0–15.0)
Hemoglobin: 9.9 g/dL — ABNORMAL LOW (ref 12.0–15.0)
MCH: 22.2 pg — ABNORMAL LOW (ref 26.0–34.0)
MCH: 23.1 pg — ABNORMAL LOW (ref 26.0–34.0)
MCHC: 26.3 g/dL — AB (ref 30.0–36.0)
MCHC: 27.3 g/dL — ABNORMAL LOW (ref 30.0–36.0)
MCV: 84.7 fL (ref 78.0–100.0)
MCV: 84.8 fL (ref 78.0–100.0)
Platelets: 132 10*3/uL — ABNORMAL LOW (ref 150–400)
Platelets: 149 10*3/uL — ABNORMAL LOW (ref 150–400)
RBC: 4.45 MIL/uL (ref 3.87–5.11)
RBC: 4.28 MIL/uL (ref 3.87–5.11)
RDW: 18.2 % — AB (ref 11.5–15.5)
RDW: 18.5 % — ABNORMAL HIGH (ref 11.5–15.5)
WBC: 6.5 10*3/uL (ref 4.0–10.5)
WBC: 7 10*3/uL (ref 4.0–10.5)

## 2017-04-24 LAB — URINE CULTURE: Culture: NO GROWTH

## 2017-04-24 LAB — GLUCOSE, CAPILLARY
GLUCOSE-CAPILLARY: 86 mg/dL (ref 65–99)
GLUCOSE-CAPILLARY: 83 mg/dL (ref 65–99)
Glucose-Capillary: 128 mg/dL — ABNORMAL HIGH (ref 65–99)
Glucose-Capillary: 69 mg/dL (ref 65–99)
Glucose-Capillary: 74 mg/dL (ref 65–99)
Glucose-Capillary: 78 mg/dL (ref 65–99)
Glucose-Capillary: 94 mg/dL (ref 65–99)

## 2017-04-24 LAB — MAGNESIUM: Magnesium: 2 mg/dL (ref 1.7–2.4)

## 2017-04-24 LAB — VITAMIN D 25 HYDROXY (VIT D DEFICIENCY, FRACTURES): VIT D 25 HYDROXY: 8.5 ng/mL — AB (ref 30.0–100.0)

## 2017-04-24 SURGERY — FIXATION, FRACTURE, INTERTROCHANTERIC, WITH INTRAMEDULLARY ROD
Anesthesia: General | Laterality: Right

## 2017-04-24 MED ORDER — SODIUM CHLORIDE 0.9 % IV BOLUS
250.0000 mL | Freq: Once | INTRAVENOUS | Status: AC
  Administered 2017-04-24: 250 mL via INTRAVENOUS

## 2017-04-24 NOTE — Progress Notes (Signed)
Orthopedic Tech Progress Note Patient Details:  [name] 03/25/1932 [id number]  Patient ID: [name], female   DOB: 04/12/1932, 84 y.o.   MRN: [id number] Pt cant have ohf due to age restrictions  Trinna PostMartinez, Danialle Dement J 04/24/2017, 7:13 PM

## 2017-04-24 NOTE — Progress Notes (Signed)
[facility] notified that patient has bp 82/52. Ilean SkillVeronica Elfrida Pixley LPN

## 2017-04-24 NOTE — Progress Notes (Signed)
PROGRESS NOTE    [name]  WUJ:[id number] DOB: 04/01/1932 DOA: 04/22/2017 PCP: No primary care provider on file.  Brief Narrative:  Is a 84 year old female history of coronary artery disease, prior CABG, history of mitral valve repair, hypertension, dyslipidemia, peripheral vascular disease, carotid artery stenosis, AAA, history of chronic diastolic heart failure associated pulmonary hypertension presents to the ED after mechanical fall and noted to have a right intertrochanteric proximal Femur Fx.    Patient also noted to be hypoxemic with sats dropping in the 70s.  Chest x-ray and physical exam concerning for volume overload.  Patient also noted to have a right-sided pleural effusion and lower extremity edema.  BNP significantly elevated.  Patient with borderline blood pressure and as such diuretics were held.  Cardiac enzymes cycled with initial enzyme elevated at 0.42.    Cardiology consulted for further evaluation and cardiac clearance.  Pulmonary also consulted due to moderate size right-sided pleural effusion.  Orthopedics consulted to repair hip fracture and will likely undergo tomorrow. IR consulted for Right Thoracentesis prior to Surgery.   Assessment & Plan:   Principal Problem:   Closed displaced intertrochanteric fracture of right femur (HCC) Active Problems:   H/O mitral valve repair   Hypertension, essential, benign   Acute on chronic diastolic (congestive) heart failure (HCC)   Dyslipidemia, goal LDL below 70   PVD (peripheral vascular disease) (HCC)   Dementia   AAA (abdominal aortic aneurysm) (HCC)   Acute respiratory failure with hypoxia (HCC)   CKD (chronic kidney disease), stage III (HCC)   Carotid artery stenosis   Pulmonary hypertension (HCC)   Former tobacco use   Hx of CABG   Pleural effusion, right   Preoperative respiratory examination   Evaluation by psychiatric service required   Hypoxia  Closed Right Intertrochanteric Fx of the Right Proximal  Right Femur -Questionable etiology.   -Unknown as to whether it was secondary to mechanical fall.   -Hip X-Ray showed Pelvic ring is intact. Comminuted intratrochanteric fracture of the proximal right femur is noted with impaction and angulation at the fracture site. No soft tissue changes are seen. Diffuse vascular calcifications are noted. -Cardiac enzymes are being cycled and initial troponin is slightly elevated.   -Patient in consultation by Orthopedics and plan is to have Right Hip Repaired after clearance by Cardiology  -Pulmonary evaluated and feel as if she is at High Risk for any respiratory complication such as PNA, Collapse but is at Moderate Risk for Prolonged Vent Dependence after Surgery  -Pain Control with Robaxin 500 mg po/IV q6hprn Muscle Spasms, Morphine 1 mg q2hprn Severe Pain, and Oxycodone 5-10 mg po q4hprn Moderate Pain  Acute on chronic respiratory failure/hypoxia/shortness of breath likely secondary to right pleural effusion and CHF  -Patient noted to be hypoxic overnight and noted to have some sats in the 80s.  Patient however not in distress. -Chest x-ray was ordered yesterday morning concerning for moderate right-sided pleural effusion.  Checked LDH and was 666.  -Pulmonary evaluated and feel Right Pleural Effusion is Small to Moderate and feel like it is Cardiac Related -Recommending Diuresis and IR Guided Thoracentesis  -Per Pulmonary High Risk for for any Respiratory Complication but Moderate Risk for Prolonged Vent Dependence after Surgery  -IR Consulted for Right Thoracentesis   Elevated Troponin  -Cardiology following and feel it is 2/2 to Demand Ischemia -Troponin Elevated to 0.45 -No Active Chest Pain  -Transthoracic Echo has been obtained EF of 60-65%. -Trivial aortic valvular regurgitation.  Images  were inadequate for left ventricular wall motion assessment. Left ventricular systolic function normal.   Chronic Kidney Disease Stage III -Stable. BUN/Cr  went from 24/0.87 -> 20/0.84 -Continue to Monitor Renal Fxn Carefully as patient is being Diuresed  Dementia with Psychotic Features -Patient noted to be demented on admission and felt not to have capacity.   -Concern for frontotemporal dementia given hallucinations. -Psychiatry was consulted and patient deemed not to have capacity.   -Appreciated Psychiatric input and recommendations. -Will place on Delirium Precautions   HTN -Blood pressure borderline but resumed Metoprolol XL 100 mg -C/w Diuresis per Cardiology -BP this AM was 95/68 -Continue to Monitor extremely closely   Acute Decompensation of Chronic Diastolic Heart Failure -BNP elevated at 3142.4.   -Blood pressure was borderline and as such oral diuretics were discontinued but now getting IV Diuresis    -Patient with Troponin level of 0.42 and increased to 0.45. -Transthoracic Echo with a EF of 60-65%.  Images inadequate to assess left ventricular wall motion but did show severe PHTN with an estimated RV Systolic Pressure of 75 mmHg -Strict I's/O's, Daily Weights, SLIV and 1500 mL Fluid Restriction -Cardiology consulted for further evaluation and recommendations  -Recommending continuing IV Lasix 40 mg BIDand plan for Hip Repair in AM -C/w Metoprolol Succinate 100 mg po Daily  -LE Duplex Negative for DVT  Hyperbilirubinemia -T Bili was elevated on Admission at 1.9 -Repeat CMP in AM  Thrombocytopenia -Mild. Platelet Count improving and went from 132 -> 149 -Continue to Monitor and repeat CBC in AM  Normocytic Anemia -Patient's Hb/Hct stable at 9.9/36.3 -Continue to Monitor for S/Sx of Bleeding -Repeat CBC in AM   Abnormal LFT's -Mildly elevated  AST at 54 -Repeat CMP in AM   DVT prophylaxis: Enoxaparin 30 mg sq q24h Code Status: FULL CODE Family Communication: Discussed with Husband and Sister at bedside  Disposition Plan: Remain Inpatient for Anticipated Femur Surgery in AM   Consultants:   Orthopedic  Surgery  Cardiology  PCCM/Pulmonary   Psychiatry   Interventional Radiology    Procedures:  BILATERAL LE VENOUS DUPLEX Bilateral lower extremity venous duplex has been completed. Negative for obvious evidence of DVT.   Antimicrobials:  Anti-infectives (From admission, onward)   Start     Dose/Rate Route Frequency Ordered Stop   04/23/17 0600  ceFAZolin (ANCEF) IVPB 2g/100 mL premix     2 g 200 mL/hr over 30 Minutes Intravenous On call to O.R. 04/23/17 0000 04/24/17 0559     Subjective: Seen and examined at bedside and had her eyes closed wanting to rest but able to converse and knew she was in "the hospital in [place]." No CP or SOB and states pain in leg was ok. No other complaints or concerns and family at bedside.   Objective: Vitals:   04/23/17 2008 04/24/17 0355 04/24/17 1115 04/24/17 1508  BP: 110/69 114/72 93/68 95/68   Pulse: 82 80 100 (!) 101  Resp: 17 18    Temp: 98 F (36.7 C) 98.2 F (36.8 C)  99.1 F (37.3 C)  TempSrc: Oral Axillary  Oral  SpO2: 96% 98% 100%   Weight:      Height:        Intake/Output Summary (Last 24 hours) at 04/24/2017 1538 Last data filed at 04/24/2017 1441 Gross per 24 hour  Intake 0 ml  Output 1800 ml  Net -1800 ml   Filed Weights   04/22/17 1546  Weight: 45.9 kg (101 lb 3.1 oz)   Examination: Physical  Exam:  Constitutional: Thin frail cachectic Caucasian female in NAD and appears calm  Eyes: Lids and conjunctivae normal, sclerae anicteric  ENMT: External Ears, Nose appear normal. Grossly normal hearing. Mucous membranes are moist.   Neck: Appears normal, supple, no cervical masses, normal ROM, no appreciable thyromegaly; Had some JVD Respiratory: Diminished to auscultation bilaterally but worse on Right with some crackles, No appreciable wheezing, rales, rhonchi. Normal respiratory effort and patient is not tachypenic. No accessory muscle use.  Cardiovascular: RRR, no murmurs / rubs / gallops. S1 and S2 auscultated.  Very Little LE Edema  Abdomen: Soft, non-tender, Distended. No masses palpated. No appreciable hepatosplenomegaly. Bowel sounds positive x4.  GU: Deferred. Musculoskeletal: No clubbing / cyanosis of digits/nails. Right Hip with pain on palpation.  Skin: No rashes, lesions, ulcers on a limited skin evaluation. No induration; Warm and dry.  Neurologic: CN 2-12 grossly intact with no focal deficits.  Romberg sign and cerebellar reflexes not assessed.  Psychiatric:  Sleepy but able to arouse easily. Oriented x2. Normal mood and appropriate affect.   Data Reviewed: I have personally reviewed following labs and imaging studies  CBC: Recent Labs  Lab 04/22/17 1120 04/23/17 0842 04/24/17 0529  WBC 7.9 9.5 6.5  NEUTROABS 6.6 8.2*  --   HGB 12.0 11.2* 9.9*  HCT 44.8 42.0 37.7  MCV 82.2 83.2 84.7  PLT 173 153 132*   Basic Metabolic Panel: Recent Labs  Lab 04/22/17 1120 04/23/17 0842 04/23/17 1453 04/24/17 0529  NA 141  --  140 142  K 4.4  --  5.0 4.9  CL 101  --  103 100*  CO2 28  --  25 33*  GLUCOSE 117*  --  100* 83  BUN 20  --  24* 20  CREATININE 1.03*  --  0.87 0.84  CALCIUM 9.1  --  8.6* 8.7*  MG  --  1.9  --  2.0   GFR: Estimated Creatinine Clearance: 37.4 mL/min (by C-G formula based on SCr of 0.84 mg/dL). Liver Function Tests: Recent Labs  Lab 04/23/17 0925  AST 54*  ALT 17  ALKPHOS 35*  BILITOT 1.9*  PROT 5.0*  ALBUMIN 2.5*   No results for input(s): LIPASE, AMYLASE in the last 168 hours. No results for input(s): AMMONIA in the last 168 hours. Coagulation Profile: Recent Labs  Lab 04/22/17 1120  INR 1.23   Cardiac Enzymes: Recent Labs  Lab 04/23/17 1453 04/23/17 2032  TROPONINI 0.42* 0.45*   BNP (last 3 results) No results for input(s): PROBNP in the last 8760 hours. HbA1C: No results for input(s): HGBA1C in the last 72 hours. CBG: Recent Labs  Lab 04/24/17 0037 04/24/17 0349 04/24/17 0747 04/24/17 1201 04/24/17 1258  GLUCAP 78 86 74 69 83    Lipid Profile: No results for input(s): CHOL, HDL, LDLCALC, TRIG, CHOLHDL, LDLDIRECT in the last 72 hours. Thyroid Function Tests: Recent Labs    04/22/17 2156  TSH 1.686   Anemia Panel: No results for input(s): VITAMINB12, FOLATE, FERRITIN, TIBC, IRON, RETICCTPCT in the last 72 hours. Sepsis Labs: Recent Labs  Lab 04/22/17 2156  PROCALCITON <0.10    Recent Results (from the past 240 hour(s))  Surgical pcr screen     Status: None   Collection Time: 04/23/17  1:07 AM  Result Value Ref Range Status   MRSA, PCR NEGATIVE NEGATIVE Final   Staphylococcus aureus NEGATIVE NEGATIVE Final    Comment: (NOTE) The Xpert SA Assay (FDA approved for NASAL specimens in patients 22 years  of age and older), is one component of a comprehensive surveillance program. It is not intended to diagnose infection nor to guide or monitor treatment. Performed at [facility], [address] [place], [place] [address]   Urine Culture     Status: None   Collection Time: 04/23/17  8:37 AM  Result Value Ref Range Status   Specimen Description URINE, RANDOM  Final   Special Requests NONE  Final   Culture   Final    NO GROWTH Performed at [facility], [address] [place], [place] [address]    Report Status 04/24/2017 FINAL  Final    Radiology Studies: Dg Chest Port 1 View  Result Date: 04/23/2017 CLINICAL DATA:  84 year old female with hypoxia. Subsequent encounter. EXAM: PORTABLE CHEST 1 VIEW COMPARISON:  04/22/2017 chest x-ray. FINDINGS: Post CABG and valve replacement.  Cardiomegaly. Progressive enlargement of right-sided pleural effusion now moderate to large in size with associated right lung atelectasis. Pulmonary vascular congestion. Medial left base subsegmental atelectasis. Calcified tortuous aorta.  Prominent vascular calcifications. IMPRESSION: Progressive enlargement of right-sided pleural effusion now moderate to large in size with associated right lung atelectasis. Mild  atelectasis medial aspect left lung base. Pulmonary vascular congestion. Cardiomegaly post CABG and valve replacement. Aortic Atherosclerosis (ICD10-I70.0). Electronically Signed   By: Lacy Duverney M.D.   On: 04/23/2017 08:45   Scheduled Meds: . aspirin EC  81 mg Oral Daily  . atorvastatin  40 mg Oral q1800  . chlorhexidine  60 mL Topical Once  . enoxaparin (LOVENOX) injection  30 mg Subcutaneous Q24H  . furosemide  40 mg Intravenous BID  . metoprolol succinate  100 mg Oral Daily  .  morphine injection  2 mg Intravenous Once  . ondansetron (ZOFRAN) IV  4 mg Intravenous Once  . potassium chloride SA  20 mEq Oral Daily  . povidone-iodine  2 application Topical Once   Continuous Infusions: . methocarbamol (ROBAXIN)  IV      LOS: 2 days   Merlene Laughter, DO [facility] Pager [phone]  If 7PM-7AM, please contact night-coverage [email or web address] Password [facility] 04/24/2017, 3:38 PM

## 2017-04-24 NOTE — Progress Notes (Signed)
Bilateral lower extremity venous duplex has been completed. Negative for obvious evidence of DVT.  04/24/17 10:22 AM Olen CordialGreg Dmiyah Liscano RVT

## 2017-04-24 NOTE — NC FL2 (Addendum)
MEDICAID FL2 LEVEL OF CARE SCREENING TOOL     IDENTIFICATION  Patient Name: [name] Birthdate: 1932-05-11 Sex: female Admission Date (Current Location): 04/22/2017  [facility] and [place] Number:  Producer, television/film/video and Address:  [facility], [address], [place], [place] [address]      Provider Number: [id number]  Attending Physician Name and Address:  Merlene Laughter, DO  Relative Name and Phone Number:  Glynnis Gavel [phone]    Current Level of Care: Hospital Recommended Level of Care: Skilled Nursing Facility Prior Approval Number:    Date Approved/Denied:   PASRR Number:  [id number]  Discharge Plan: SNF   Current Diagnoses: Patient Active Problem List   Diagnosis Date Noted  . Pleural effusion, right 04/23/2017  . Preoperative respiratory examination   . Evaluation by psychiatric service required   . Hypoxia   . Closed displaced intertrochanteric fracture of right femur (HCC) 04/22/2017  . Hypertension, essential, benign 04/22/2017  . Acute on chronic diastolic (congestive) heart failure (HCC) 04/22/2017  . Dyslipidemia, goal LDL below 70 04/22/2017  . PVD (peripheral vascular disease) (HCC) 04/22/2017  . Dementia 04/22/2017  . AAA (abdominal aortic aneurysm) (HCC) 04/22/2017  . Acute respiratory failure with hypoxia (HCC) 04/22/2017  . CKD (chronic kidney disease), stage III (HCC) 04/22/2017  . Carotid artery stenosis 04/22/2017  . Pulmonary hypertension (HCC) 04/22/2017  . Former tobacco use 04/22/2017  . Hx of CABG 04/22/2017  . H/O mitral valve repair 06/04/2012  . CELLULITIS, HAND 08/22/2010  . RENAL ARTERY STENOSIS 09/07/2009  . HYPOKALEMIA 07/26/2009  . RHINITIS, ALLERGIC NOS 12/04/2005    Orientation RESPIRATION BLADDER Height & Weight     Self O2(2L) Incontinent, Indwelling catheter Weight: 101 lb 3.1 oz (45.9 kg) Height:  5\' 2"  (157.5 cm)  BEHAVIORAL SYMPTOMS/MOOD NEUROLOGICAL BOWEL NUTRITION  STATUS      Incontinent Diet(see discharge summary)  AMBULATORY STATUS COMMUNICATION OF NEEDS Skin   Extensive Assist Verbally Surgical wounds, Skin abrasions(closed right intertrochanteric hip fracture)                       Personal Care Assistance Level of Assistance  Bathing, Feeding, Dressing Bathing Assistance: Maximum assistance Feeding assistance: Maximum assistance Dressing Assistance: Maximum assistance     Functional Limitations Info  Sight, Hearing, Speech Sight Info: Adequate Hearing Info: Adequate Speech Info: Adequate    SPECIAL CARE FACTORS FREQUENCY  PT (By licensed PT), OT (By licensed OT)     PT Frequency: 5x week OT Frequency: 5x week            Contractures Contractures Info: Not present    Additional Factors Info  Code Status, Allergies Code Status Info: Full Code Allergies Info: No Known Allergies           Current Medications (04/24/2017):  This is the current hospital active medication list Current Facility-Administered Medications  Medication Dose Route Frequency Provider Last Rate Last Dose  . aspirin EC tablet 81 mg  81 mg Oral Daily Russella Dar, NP   81 mg at 04/24/17 1131  . atorvastatin (LIPITOR) tablet 40 mg  40 mg Oral q1800 Russella Dar, NP   40 mg at 04/24/17 1742  . chlorhexidine (HIBICLENS) 4 % liquid 4 application  60 mL Topical Once [name], PA-C      . enoxaparin (LOVENOX) injection 30 mg  30 mg Subcutaneous Q24H Rodolph Bong, MD   30 mg  at 04/23/17 2044  . furosemide (LASIX) injection 40 mg  40 mg Intravenous BID Barrett, Rhonda G, PA-C   40 mg at 04/24/17 1742  . iopamidol (ISOVUE-300) 61 % injection 100 mL  100 mL Intravenous Once PRN Azalia Bilisampos, Kevin, MD      . methocarbamol (ROBAXIN) tablet 500 mg  500 mg Oral Q6H PRN Russella DarEllis, Allison L, NP       Or  . methocarbamol (ROBAXIN) 500 mg in dextrose 5 % 50 mL IVPB  500 mg Intravenous Q6H PRN Russella DarEllis, Allison L, NP      . metoprolol succinate  (TOPROL-XL) 24 hr tablet 100 mg  100 mg Oral Daily Russella DarEllis, Allison L, NP      . morphine 4 MG/ML injection 1 mg  1 mg Intravenous Q2H PRN Russella DarEllis, Allison L, NP      . morphine 4 MG/ML injection 2 mg  2 mg Intravenous Once Russella DarEllis, Allison L, NP      . ondansetron [facility](ZOFRAN) injection 4 mg  4 mg Intravenous Once Junious SilkEllis, Allison L, NP      . oxyCODONE (Oxy IR/ROXICODONE) immediate release tablet 5-10 mg  5-10 mg Oral Q4H PRN Russella DarEllis, Allison L, NP   5 mg at 04/24/17 1441  . potassium chloride SA (K-DUR,KLOR-CON) CR tablet 20 mEq  20 mEq Oral Daily Russella DarEllis, Allison L, NP   20 mEq at 04/24/17 1130  . povidone-iodine 10 % swab 2 application  2 application Topical Once Freeman CaldronJeffery, Michael J, PA-C         Discharge Medications: Please see discharge summary for a list of discharge medications.  Relevant Imaging Results:  Relevant Lab Results:   Additional Information SS# [id number]  Hamilton Marinello [place], [place]

## 2017-04-24 NOTE — Anesthesia Preprocedure Evaluation (Deleted)
Anesthesia Evaluation    Airway       Dental   Pulmonary former smoker,          Cardiovascular hypertension,     Neuro/Psych    GI/Hepatic   Endo/Other    Renal/GU      Musculoskeletal   Abdominal   Peds  Hematology   Anesthesia Other Findings   Reproductive/Obstetrics                          Anesthesia Physical Anesthesia Plan Anesthesia Quick Evaluation  

## 2017-04-24 NOTE — Evaluation (Signed)
Clinical/Bedside Swallow Evaluation Patient Details  Name: [name] MRN: [id number] Date of Birth: 1932-09-30  Today's Date: 04/24/2017 Time: SLP Start Time (ACUTE ONLY): 1354 SLP Stop Time (ACUTE ONLY): 1415 SLP Time Calculation (min) (ACUTE ONLY): 21 min  Past Medical History:  Past Medical History:  Diagnosis Date  . AAA (abdominal aortic aneurysm) (HCC)   . Carotid artery stenosis   . Chronic diastolic heart failure (HCC)   . Coronary atherosclerosis of native coronary artery   . Dementia   . Hypercholesteremia   . Hypertension, essential, benign   . PVD (peripheral vascular disease) (HCC)   . Rhinitis, allergic    NOS   Past Surgical History:  Past Surgical History:  Procedure Laterality Date  . CAROTID ENDARTERECTOMY  03/2008   Right  . COLONOSCOPY  1996   in [place]  . CORONARY ARTERY BYPASS GRAFT  05/22/2006   Left int mammary to distal left ant descending CA, spahenous vein graft to ramus intermediate branch  . MITRAL VALVE REPAIR  05/22/2006   Quadrangular resection of post leaflet w/ sliding leaflet annuloplaty and 24mm Edwards Physio ring   HPI:   84 year old female history of coronary artery disease, prior CABG, history of mitral valve repair, hypertension, dyslipidemia, peripheral vascular disease, carotid artery stenosis, AAA, history of chronic diastolic heart failure associated pulmonary hypertension presents to the ED after mechanical fall and noted to have a right intertrochanteric hip fracture.  Planned repair on 04/25/17.    Assessment / Plan / Recommendation Clinical Impression  Pt demonstrates cognitive impairment impacting attention during PO intake. Upon SLP arrival pt was found laying almost flat in bed with sister feeding her. Prolonged mastication and pocketing observed. Repositioned pt upright but comfortable and demonstrated basic aspiration precautions to sister, verbal cueing provided for bolus transit.  Pt is at increased risk as aspiration due to  respiratory function and AMS and needs special care with feeding and precaution. Sister verbalized understanding. Will f/u for tolerance.  SLP Visit Diagnosis: Dysphagia, oral phase (R13.11)    Aspiration Risk       Diet Recommendation Dysphagia 3 (Mech soft);Thin liquid   Liquid Administration via: Cup;Straw Medication Administration: Whole meds with puree Supervision: Staff to assist with self feeding;Full supervision/cueing for compensatory strategies Compensations: Slow rate;Small sips/bites;Follow solids with liquid;Lingual sweep for clearance of pocketing Postural Changes: Seated upright at 90 degrees    Other  Recommendations Oral Care Recommendations: Oral care BID   Follow up Recommendations Skilled Nursing facility      Frequency and Duration min 2x/week  2 weeks       Prognosis        Swallow Study   General HPI:  84 year old female history of coronary artery disease, prior CABG, history of mitral valve repair, hypertension, dyslipidemia, peripheral vascular disease, carotid artery stenosis, AAA, history of chronic diastolic heart failure associated pulmonary hypertension presents to the ED after mechanical fall and noted to have a right intertrochanteric hip fracture.  Planned repair on 04/25/17.  Type of Study: Bedside Swallow Evaluation Previous Swallow Assessment: none Diet Prior to this Study: Dysphagia 3 (soft);Thin liquids Temperature Spikes Noted: No Respiratory Status: Nasal cannula History of Recent Intubation: No Behavior/Cognition: Requires cueing Oral Care Completed by SLP: No Oral Cavity - Dentition: Edentulous Vision: Impaired for self-feeding Self-Feeding Abilities: Needs assist Patient Positioning: Partially reclined Baseline Vocal Quality: Normal Volitional Cough: Cognitively unable to elicit    Oral/Motor/Sensory Function Overall Oral Motor/Sensory Function: Within functional limits   Ice Chips  Thin Liquid Thin Liquid: Within functional  limits Presentation: Straw    Nectar Thick Nectar Thick Liquid: Not tested   Honey Thick Honey Thick Liquid: Not tested   Puree Puree: Not tested   Solid   GO   Solid: Impaired Presentation: Spoon Oral Phase Impairments: Impaired mastication;Poor awareness of bolus Oral Phase Functional Implications: Prolonged oral transit       [name], MA CCC-SLP [phone]  [name], Riley NearingBonnie [name] 04/24/2017,2:31 PM

## 2017-04-24 NOTE — Progress Notes (Addendum)
Progress Note  Patient Name: [name] Date of Encounter: 04/24/2017  Primary Cardiologist: Olga Millers, MD   Subjective   Pt laying with her eyes closed but answers questions and responds appropriately. She says she is not SOB.   Inpatient Medications    Scheduled Meds: . aspirin EC  81 mg Oral Daily  . atorvastatin  40 mg Oral q1800  . chlorhexidine  60 mL Topical Once  . enoxaparin (LOVENOX) injection  30 mg Subcutaneous Q24H  . furosemide  40 mg Intravenous BID  . metoprolol succinate  100 mg Oral Daily  .  morphine injection  2 mg Intravenous Once  . ondansetron (ZOFRAN) IV  4 mg Intravenous Once  . potassium chloride SA  20 mEq Oral Daily  . povidone-iodine  2 application Topical Once   Continuous Infusions: . methocarbamol (ROBAXIN)  IV     PRN Meds: iopamidol, methocarbamol **OR** methocarbamol (ROBAXIN)  IV, morphine injection, oxyCODONE   Vital Signs    Vitals:   04/23/17 1344 04/23/17 2008 04/24/17 0355 04/24/17 1115  BP: 125/75 110/69 114/72 93/68  Pulse: 79 82 80 100  Resp:  17 18   Temp: 98.1 F (36.7 C) 98 F (36.7 C) 98.2 F (36.8 C)   TempSrc: Oral Oral Axillary   SpO2: (!) 84% 96% 98% 100%  Weight:      Height:        Intake/Output Summary (Last 24 hours) at 04/24/2017 1220 Last data filed at 04/24/2017 0900 Gross per 24 hour  Intake 0 ml  Output 800 ml  Net -800 ml   Filed Weights   04/22/17 1546  Weight: 101 lb 3.1 oz (45.9 kg)    Telemetry      ECG    04/24/17- NSR, PVC, LAD, LVH - Personally Reviewed  Physical Exam   GEN: Cachectic Caucasian female No acute distress.  Eyes closed but awake Neck: No JVD Cardiac: RRR 2/6 systolic murmur LSB Respiratory: decreased breath sounds Rt > Lt, no rales GI: Soft, nontender, non-distended  MS: trace LE edema, No deformity. Neuro:  Nonfocal  Psych: Normal affect   Labs    Chemistry Recent Labs  Lab 04/22/17 1120 04/23/17 0925 04/23/17 1453 04/24/17 0529  NA 141  --   140 142  K 4.4  --  5.0 4.9  CL 101  --  103 100*  CO2 28  --  25 33*  GLUCOSE 117*  --  100* 83  BUN 20  --  24* 20  CREATININE 1.03*  --  0.87 0.84  CALCIUM 9.1  --  8.6* 8.7*  PROT  --  5.0*  --   --   ALBUMIN  --  2.5*  --   --   AST  --  54*  --   --   ALT  --  17  --   --   ALKPHOS  --  35*  --   --   BILITOT  --  1.9*  --   --   GFRNONAA 49*  --  >60 >60  GFRAA 57*  --  >60 >60  ANIONGAP 12  --  12 9     Hematology Recent Labs  Lab 04/22/17 1120 04/23/17 0842 04/24/17 0529  WBC 7.9 9.5 6.5  RBC 5.45* 5.05 4.45  HGB 12.0 11.2* 9.9*  HCT 44.8 42.0 37.7  MCV 82.2 83.2 84.7  MCH 22.0* 22.2* 22.2*  MCHC 26.8* 26.7* 26.3*  RDW 18.3* 18.3* 18.2*  PLT 173 153 132*    Cardiac Enzymes Recent Labs  Lab 04/23/17 1453 04/23/17 2032  TROPONINI 0.42* 0.45*   No results for input(s): TROPIPOC in the last 168 hours.   BNP Recent Labs  Lab 04/22/17 2156  BNP 3,142.4*     DDimer No results for input(s): DDIMER in the last 168 hours.   Radiology    Dg Chest Port 1 View  Result Date: 04/23/2017 CLINICAL DATA:  84 year old female with hypoxia. Subsequent encounter. EXAM: PORTABLE CHEST 1 VIEW COMPARISON:  04/22/2017 chest x-ray. FINDINGS: Post CABG and valve replacement.  Cardiomegaly. Progressive enlargement of right-sided pleural effusion now moderate to large in size with associated right lung atelectasis. Pulmonary vascular congestion. Medial left base subsegmental atelectasis. Calcified tortuous aorta.  Prominent vascular calcifications. IMPRESSION: Progressive enlargement of right-sided pleural effusion now moderate to large in size with associated right lung atelectasis. Mild atelectasis medial aspect left lung base. Pulmonary vascular congestion. Cardiomegaly post CABG and valve replacement. Aortic Atherosclerosis (ICD10-I70.0). Electronically Signed   By: Lacy Duverney M.D.   On: 04/23/2017 08:45    Cardiac Studies   Echo 04/23/17- Study Conclusions  - Left  ventricle: The cavity size was normal. Wall thickness was   normal. Systolic function was normal. The estimated ejection   fraction was in the range of 60% to 65%. Images were inadequate   for LV wall motion assessment. - Aortic valve: There was trivial regurgitation. - Mitral valve: s/p annuloplasty repair. No significant stenosis.   Mild regurgitation. Valve area by continuity equation (using LVOT   flow): 1.68 cm^2. - Left atrium: The atrium was normal in size. - Right ventricle: The cavity size was mildly dilated. Moderately   reduced RV systolic function. - Right atrium: The atrium was normal in size. - Tricuspid valve: There was moderate regurgitation. - Pulmonary arteries: PA peak pressure: 75 mm Hg (S). - Inferior vena cava: The vessel was dilated. The respirophasic   diameter changes were blunted (< 50%), consistent with elevated   central venous pressure. - Pericardium, extracardiac: Ascites is noted.  Impressions:  - Compared to a study in 2014, the LVEF is higher at 60-65%. The   RVSP is much higher at 75 mmHg with moderate TR. There is no   significant MV stenosis and mild regurgitation. Ascites is noted.  Patient Profile     84 y.o. female with medical history significant for history of CAD with prior CABG x 2 and mitral valve repair with LAA ligation 2008, hypertension, dyslipidemia, RCE 2010, moderate iliac disease, AAA 2.8 cm in 2015, and chronic diastolic heart failure with associated pulmonary hypertension, admitted 04/22/17 with fractured Rt hip. On admission pt had respiratory distress and confusion. Her BNP was 3k and she had Rt pleural effusion on CXR, cardiology consulted 4/9.   Assessment & Plan    Acute on chronic diastolic CHF- she has diuresed 1.8L on Iv lasix. Normal LVF and pulmonary HTN on echo.   Hip fracture- she needs surgery- this has apparently been postponed until Cardiology feels she is stable enough  Dementia- pt has been deemed not competent  to make medical decisions on her behalf.   H/O CABG and MV repair 2008  Know PVD  Pulmonary HTN  Plan: Will review with MD- she is diuresing. Continue IV Lasix. Someone had mentioned pleural tap to the family (no notes in the chart about this), ? Check f/u CXR tomorrow.   For questions or updates, please contact [facility] Please consult [email or web address]  for contact info under Cardiology/STEMI.      Signed, Corine ShelterLuke Kilroy, PA-C  04/24/2017, 12:20 PM    Attending Note:   The patient was seen and examined.  Agree with assessment and plan as noted above.  Changes made to the above note as needed.  Patient seen and independently examined with Corine ShelterLuke Kilroy, PA .   We discussed all aspects of the encounter. I agree with the assessment and plan as stated above.  1.   Chronic diastolic CHF:  Has diuresed 1.8 liters  So far. seems to be doing better .  She still has some increase JVD and peripheral edema  I would like 1 more day of diuresis and plan for hip repair tomorrow     I have spent a total of 40 minutes with patient reviewing hospital  notes , telemetry, EKGs, labs and examining patient as well as establishing an assessment and plan that was discussed with the patient. > 50% of time was spent in direct patient care.    Vesta MixerPhilip J. Marjon Doxtater, Montez HagemanJr., MD, [facility] 04/24/2017, 1:36 PM [address],  [address] Office [phone] Pager [phone]

## 2017-04-24 NOTE — Social Work (Signed)
CSW aware that pt will have surgical procedure in morning, will await recommendations of PT/OT post surgery.   CSW continuing to follow.  Doy HutchingIsabel H Jaiyanna [name], LCSWA [facility] Clinical Social Work [phone]

## 2017-04-25 ENCOUNTER — Encounter (HOSPITAL_COMMUNITY)
Admission: EM | Disposition: A | Discharge status: Skilled Nursing Facility | Payer: Self-pay | Source: Home / Self Care | Attending: Pulmonary Disease

## 2017-04-25 ENCOUNTER — Inpatient Hospital Stay (HOSPITAL_COMMUNITY): Payer: Medicare Other

## 2017-04-25 ENCOUNTER — Inpatient Hospital Stay (HOSPITAL_COMMUNITY): Payer: Medicare Other | Admitting: Anesthesiology

## 2017-04-25 ENCOUNTER — Encounter (HOSPITAL_COMMUNITY): Payer: Self-pay | Admitting: Certified Registered Nurse Anesthetist

## 2017-04-25 DIAGNOSIS — R0902 Hypoxemia: Secondary | ICD-10-CM

## 2017-04-25 HISTORY — PX: INTRAMEDULLARY (IM) NAIL INTERTROCHANTERIC: SHX5875

## 2017-04-25 LAB — POCT I-STAT 3, ART BLOOD GAS (G3+)
Acid-Base Excess: 9 mmol/L — ABNORMAL HIGH (ref 0.0–2.0)
BICARBONATE: 35.2 mmol/L — AB (ref 20.0–28.0)
O2 Saturation: 98 %
PCO2 ART: 53 mmHg — AB (ref 32.0–48.0)
PH ART: 7.427 (ref 7.350–7.450)
PO2 ART: 97 mmHg (ref 83.0–108.0)
Patient temperature: 97.2
TCO2: 37 mmol/L — ABNORMAL HIGH (ref 22–32)

## 2017-04-25 LAB — BASIC METABOLIC PANEL
ANION GAP: 12 (ref 5–15)
BUN: 19 mg/dL (ref 6–20)
CO2: 27 mmol/L (ref 22–32)
CREATININE: 0.78 mg/dL (ref 0.44–1.00)
Calcium: 8.5 mg/dL — ABNORMAL LOW (ref 8.9–10.3)
Chloride: 101 mmol/L (ref 101–111)
Glucose, Bld: 162 mg/dL — ABNORMAL HIGH (ref 65–99)
Potassium: 5 mmol/L (ref 3.5–5.1)
Sodium: 140 mmol/L (ref 135–145)

## 2017-04-25 LAB — GLUCOSE, CAPILLARY
GLUCOSE-CAPILLARY: 113 mg/dL — AB (ref 65–99)
GLUCOSE-CAPILLARY: 143 mg/dL — AB (ref 65–99)
GLUCOSE-CAPILLARY: 160 mg/dL — AB (ref 65–99)
GLUCOSE-CAPILLARY: 96 mg/dL (ref 65–99)
Glucose-Capillary: 104 mg/dL — ABNORMAL HIGH (ref 65–99)
Glucose-Capillary: 101 mg/dL — ABNORMAL HIGH (ref 65–99)
Glucose-Capillary: 83 mg/dL (ref 65–99)

## 2017-04-25 LAB — BLOOD GAS, ARTERIAL
ACID-BASE EXCESS: 7.8 mmol/L — AB (ref 0.0–2.0)
Bicarbonate: 35.6 mmol/L — ABNORMAL HIGH (ref 20.0–28.0)
DRAWN BY: 52075
Expiratory PAP: 8
FIO2: 100
Inspiratory PAP: 20
Mode: POSITIVE
O2 SAT: 99.1 %
PATIENT TEMPERATURE: 98.6
PCO2 ART: 94.1 mmHg — AB (ref 32.0–48.0)
PH ART: 7.202 — AB (ref 7.350–7.450)
PO2 ART: 226 mmHg — AB (ref 83.0–108.0)
RATE: 20 resp/min

## 2017-04-25 LAB — POCT I-STAT 7, (LYTES, BLD GAS, ICA,H+H)
Acid-Base Excess: 9 mmol/L — ABNORMAL HIGH (ref 0.0–2.0)
BICARBONATE: 35.2 mmol/L — AB (ref 20.0–28.0)
Calcium, Ion: 1.16 mmol/L (ref 1.15–1.40)
HEMATOCRIT: 29 % — AB (ref 36.0–46.0)
Hemoglobin: 9.9 g/dL — ABNORMAL LOW (ref 12.0–15.0)
O2 Saturation: 100 %
PCO2 ART: 57.2 mmHg — AB (ref 32.0–48.0)
POTASSIUM: 4.8 mmol/L (ref 3.5–5.1)
Sodium: 142 mmol/L (ref 135–145)
TCO2: 37 mmol/L — AB (ref 22–32)
pH, Arterial: 7.396 (ref 7.350–7.450)
pO2, Arterial: 257 mmHg — ABNORMAL HIGH (ref 83.0–108.0)

## 2017-04-25 LAB — CBC
HCT: 31.8 % — ABNORMAL LOW (ref 36.0–46.0)
Hemoglobin: 8.4 g/dL — ABNORMAL LOW (ref 12.0–15.0)
MCH: 22.6 pg — ABNORMAL LOW (ref 26.0–34.0)
MCHC: 26.4 g/dL — AB (ref 30.0–36.0)
MCV: 85.7 fL (ref 78.0–100.0)
PLATELETS: 172 10*3/uL (ref 150–400)
RBC: 3.71 MIL/uL — ABNORMAL LOW (ref 3.87–5.11)
RDW: 18.2 % — ABNORMAL HIGH (ref 11.5–15.5)
WBC: 7.9 10*3/uL (ref 4.0–10.5)

## 2017-04-25 LAB — CBC WITH DIFFERENTIAL/PLATELET
BASOS ABS: 0 10*3/uL (ref 0.0–0.1)
Basophils Relative: 0 %
EOS ABS: 0 10*3/uL (ref 0.0–0.7)
Eosinophils Relative: 0 %
HCT: 34.2 % — ABNORMAL LOW (ref 36.0–46.0)
HEMOGLOBIN: 9 g/dL — AB (ref 12.0–15.0)
LYMPHS ABS: 0.6 10*3/uL — AB (ref 0.7–4.0)
LYMPHS PCT: 11 %
MCH: 22.4 pg — AB (ref 26.0–34.0)
MCHC: 26.3 g/dL — ABNORMAL LOW (ref 30.0–36.0)
MCV: 85.3 fL (ref 78.0–100.0)
Monocytes Absolute: 0.5 10*3/uL (ref 0.1–1.0)
Monocytes Relative: 10 %
NEUTROS PCT: 79 %
Neutro Abs: 4.4 10*3/uL (ref 1.7–7.7)
PLATELETS: 144 10*3/uL — AB (ref 150–400)
RBC: 4.01 MIL/uL (ref 3.87–5.11)
RDW: 18.1 % — ABNORMAL HIGH (ref 11.5–15.5)
WBC: 5.6 10*3/uL (ref 4.0–10.5)

## 2017-04-25 LAB — COMPREHENSIVE METABOLIC PANEL
ALK PHOS: 33 U/L — AB (ref 38–126)
ALT: 12 U/L — AB (ref 14–54)
AST: 16 U/L (ref 15–41)
Albumin: 2.4 g/dL — ABNORMAL LOW (ref 3.5–5.0)
Anion gap: 8 (ref 5–15)
BUN: 16 mg/dL (ref 6–20)
CALCIUM: 8.5 mg/dL — AB (ref 8.9–10.3)
CHLORIDE: 98 mmol/L — AB (ref 101–111)
CO2: 37 mmol/L — AB (ref 22–32)
CREATININE: 0.71 mg/dL (ref 0.44–1.00)
GFR calc non Af Amer: >60 mL/min (ref >60)
Glucose, Bld: 100 mg/dL — ABNORMAL HIGH (ref 65–99)
Potassium: 4.2 mmol/L (ref 3.5–5.1)
SODIUM: 143 mmol/L (ref 135–145)
Total Bilirubin: 1.2 mg/dL (ref 0.3–1.2)
Total Protein: 5.1 g/dL — ABNORMAL LOW (ref 6.5–8.1)

## 2017-04-25 LAB — PREPARE RBC (CROSSMATCH)

## 2017-04-25 LAB — PHOSPHORUS: PHOSPHORUS: 2.4 mg/dL — AB (ref 2.5–4.6)

## 2017-04-25 LAB — MAGNESIUM: Magnesium: 1.7 mg/dL (ref 1.7–2.4)

## 2017-04-25 SURGERY — FIXATION, FRACTURE, INTERTROCHANTERIC, WITH INTRAMEDULLARY ROD
Anesthesia: Monitor Anesthesia Care | Laterality: Right

## 2017-04-25 MED ORDER — DEXAMETHASONE SODIUM PHOSPHATE 10 MG/ML IJ SOLN
INTRAMUSCULAR | Status: DC | PRN
  Administered 2017-04-25: 4 mg via INTRAVENOUS

## 2017-04-25 MED ORDER — METOPROLOL SUCCINATE ER 50 MG PO TB24: 50.0000 mg | ORAL_TABLET | Freq: Every day | ORAL | Status: DC

## 2017-04-25 MED ORDER — MORPHINE SULFATE (PF) 4 MG/ML IV SOLN: 0.5000 mg | INTRAVENOUS | Status: DC | PRN

## 2017-04-25 MED ORDER — LACTATED RINGERS IV SOLN
INTRAVENOUS | Status: DC | PRN
  Administered 2017-04-25: 14:00:00 via INTRAVENOUS

## 2017-04-25 MED ORDER — PROPOFOL 10 MG/ML IV BOLUS
INTRAVENOUS | Status: DC | PRN
  Administered 2017-04-25: 20 mg via INTRAVENOUS

## 2017-04-25 MED ORDER — FENTANYL CITRATE (PF) 100 MCG/2ML IJ SOLN
50.0000 ug | INTRAMUSCULAR | Status: DC | PRN
  Administered 2017-04-25 – 2017-04-27 (×3): 50 ug via INTRAVENOUS
  Filled 2017-04-25 (×3): qty 2

## 2017-04-25 MED ORDER — ALBUTEROL SULFATE (2.5 MG/3ML) 0.083% IN NEBU: 2.5000 mg | INHALATION_SOLUTION | Freq: Once | RESPIRATORY_TRACT | Status: DC

## 2017-04-25 MED ORDER — ORAL CARE MOUTH RINSE
15.0000 mL | OROMUCOSAL | Status: DC
  Administered 2017-04-25 – 2017-04-27 (×18): 15 mL via OROMUCOSAL

## 2017-04-25 MED ORDER — CEFAZOLIN SODIUM-DEXTROSE 2-4 GM/100ML-% IV SOLN
INTRAVENOUS | Status: AC
  Filled 2017-04-25: qty 100

## 2017-04-25 MED ORDER — LACTATED RINGERS IV SOLN
INTRAVENOUS | Status: DC
  Administered 2017-04-25 – 2017-04-26 (×2): via INTRAVENOUS

## 2017-04-25 MED ORDER — ONDANSETRON HCL 4 MG PO TABS: 4.0000 mg | ORAL_TABLET | Freq: Four times a day (QID) | ORAL | Status: DC | PRN

## 2017-04-25 MED ORDER — SODIUM CHLORIDE 0.9 % IV SOLN
0.0000 ug/min | INTRAVENOUS | Status: DC
  Administered 2017-04-25: 50 ug/min via INTRAVENOUS
  Filled 2017-04-25: qty 10

## 2017-04-25 MED ORDER — ALBUMIN HUMAN 5 % IV SOLN
12.5000 g | Freq: Once | INTRAVENOUS | Status: AC
  Administered 2017-04-25: 12.5 g via INTRAVENOUS

## 2017-04-25 MED ORDER — POLYETHYLENE GLYCOL 3350 17 G PO PACK
17.0000 g | PACK | Freq: Every day | ORAL | Status: DC | PRN
  Administered 2017-04-26 – 2017-04-30 (×3): 17 g via ORAL
  Filled 2017-04-25 (×3): qty 1

## 2017-04-25 MED ORDER — HYDROMORPHONE HCL 1 MG/ML IJ SOLN: 0.2500 mg | INTRAMUSCULAR | Status: DC | PRN

## 2017-04-25 MED ORDER — ACETAMINOPHEN 325 MG PO TABS
325.0000 mg | ORAL_TABLET | Freq: Four times a day (QID) | ORAL | Status: DC | PRN
  Administered 2017-04-30 – 2017-05-05 (×2): 650 mg via ORAL
  Filled 2017-04-25 (×3): qty 2

## 2017-04-25 MED ORDER — HYDROCODONE-ACETAMINOPHEN 5-325 MG PO TABS: 1.0000 | ORAL_TABLET | Freq: Four times a day (QID) | ORAL | Status: DC | PRN

## 2017-04-25 MED ORDER — NALOXONE HCL 0.4 MG/ML IJ SOLN
INTRAMUSCULAR | Status: AC
  Administered 2017-04-25: 0.4 mg via INTRAVENOUS
  Filled 2017-04-25: qty 1

## 2017-04-25 MED ORDER — PROPOFOL 10 MG/ML IV BOLUS
INTRAVENOUS | Status: AC
  Filled 2017-04-25: qty 20

## 2017-04-25 MED ORDER — NALOXONE HCL 0.4 MG/ML IJ SOLN
0.4000 mg | INTRAMUSCULAR | Status: DC | PRN
  Administered 2017-04-25: 0.4 mg via INTRAVENOUS

## 2017-04-25 MED ORDER — ONDANSETRON HCL 4 MG/2ML IJ SOLN
INTRAMUSCULAR | Status: AC
  Filled 2017-04-25: qty 2

## 2017-04-25 MED ORDER — BISACODYL 10 MG RE SUPP
10.0000 mg | Freq: Every day | RECTAL | Status: DC | PRN
  Administered 2017-05-01: 10 mg via RECTAL
  Filled 2017-04-25 (×2): qty 1

## 2017-04-25 MED ORDER — BISACODYL 10 MG RE SUPP: 10.0000 mg | Freq: Every day | RECTAL | Status: DC | PRN

## 2017-04-25 MED ORDER — NALOXONE HCL 0.4 MG/ML IJ SOLN
0.4000 mg | Freq: Once | INTRAMUSCULAR | Status: AC
  Administered 2017-04-25: 0.4 mg via INTRAVENOUS

## 2017-04-25 MED ORDER — HYDROCODONE-ACETAMINOPHEN 5-325 MG PO TABS: 1.0000 | ORAL_TABLET | ORAL | 0 refills | Status: DC | PRN

## 2017-04-25 MED ORDER — FENTANYL CITRATE (PF) 250 MCG/5ML IJ SOLN
INTRAMUSCULAR | Status: AC
  Filled 2017-04-25: qty 5

## 2017-04-25 MED ORDER — ALBUMIN HUMAN 5 % IV SOLN
INTRAVENOUS | Status: AC
  Filled 2017-04-25: qty 250

## 2017-04-25 MED ORDER — METOPROLOL TARTRATE 5 MG/5ML IV SOLN
2.5000 mg | Freq: Once | INTRAVENOUS | Status: AC
  Administered 2017-04-25: 2.5 mg via INTRAVENOUS

## 2017-04-25 MED ORDER — PHENYLEPHRINE 40 MCG/ML (10ML) SYRINGE FOR IV PUSH (FOR BLOOD PRESSURE SUPPORT)
PREFILLED_SYRINGE | INTRAVENOUS | Status: AC
  Filled 2017-04-25: qty 10

## 2017-04-25 MED ORDER — METOPROLOL TARTRATE 5 MG/5ML IV SOLN
INTRAVENOUS | Status: AC
  Filled 2017-04-25: qty 5

## 2017-04-25 MED ORDER — ALBUTEROL SULFATE (2.5 MG/3ML) 0.083% IN NEBU
INHALATION_SOLUTION | RESPIRATORY_TRACT | Status: AC
  Administered 2017-04-25: 2.5 mg
  Filled 2017-04-25: qty 3

## 2017-04-25 MED ORDER — FLEET ENEMA 7-19 GM/118ML RE ENEM: 1.0000 | ENEMA | Freq: Once | RECTAL | Status: DC | PRN

## 2017-04-25 MED ORDER — PROPOFOL 500 MG/50ML IV EMUL
INTRAVENOUS | Status: DC | PRN
  Administered 2017-04-25: 25 ug/kg/min via INTRAVENOUS

## 2017-04-25 MED ORDER — FENTANYL CITRATE (PF) 100 MCG/2ML IJ SOLN
INTRAMUSCULAR | Status: DC | PRN
  Administered 2017-04-25: 25 ug via INTRAVENOUS

## 2017-04-25 MED ORDER — CHLORHEXIDINE GLUCONATE 0.12% ORAL RINSE (MEDLINE KIT): 15.0000 mL | Freq: Two times a day (BID) | OROMUCOSAL | Status: DC

## 2017-04-25 MED ORDER — METOCLOPRAMIDE HCL 5 MG PO TABS: 5.0000 mg | ORAL_TABLET | Freq: Three times a day (TID) | ORAL | Status: DC | PRN

## 2017-04-25 MED ORDER — ENOXAPARIN SODIUM 30 MG/0.3ML ~~LOC~~ SOLN: 30.0000 mg | SUBCUTANEOUS | 0 refills | Status: AC

## 2017-04-25 MED ORDER — CEFAZOLIN SODIUM-DEXTROSE 2-4 GM/100ML-% IV SOLN
2.0000 g | Freq: Four times a day (QID) | INTRAVENOUS | Status: AC
  Administered 2017-04-25 – 2017-04-26 (×2): 2 g via INTRAVENOUS
  Filled 2017-04-25 (×2): qty 100

## 2017-04-25 MED ORDER — PHENYLEPHRINE HCL 10 MG/ML IJ SOLN
INTRAVENOUS | Status: DC | PRN
  Administered 2017-04-25: 25 ug/min via INTRAVENOUS

## 2017-04-25 MED ORDER — METOCLOPRAMIDE HCL 5 MG/ML IJ SOLN: 5.0000 mg | Freq: Three times a day (TID) | INTRAMUSCULAR | Status: DC | PRN

## 2017-04-25 MED ORDER — DEXAMETHASONE SODIUM PHOSPHATE 10 MG/ML IJ SOLN
INTRAMUSCULAR | Status: AC
  Filled 2017-04-25: qty 1

## 2017-04-25 MED ORDER — PANTOPRAZOLE SODIUM 40 MG IV SOLR
40.0000 mg | Freq: Every day | INTRAVENOUS | Status: DC
  Administered 2017-04-25 – 2017-04-27 (×3): 40 mg via INTRAVENOUS
  Filled 2017-04-25 (×3): qty 40

## 2017-04-25 MED ORDER — CHLORHEXIDINE GLUCONATE 0.12% ORAL RINSE (MEDLINE KIT)
15.0000 mL | Freq: Two times a day (BID) | OROMUCOSAL | Status: DC
  Administered 2017-04-25 – 2017-04-27 (×4): 15 mL via OROMUCOSAL

## 2017-04-25 MED ORDER — PROMETHAZINE HCL 25 MG/ML IJ SOLN: 6.2500 mg | INTRAMUSCULAR | Status: DC | PRN

## 2017-04-25 MED ORDER — SUCCINYLCHOLINE CHLORIDE 20 MG/ML IJ SOLN
INTRAMUSCULAR | Status: DC | PRN
  Administered 2017-04-25: 40 mg via INTRAVENOUS

## 2017-04-25 MED ORDER — BUPIVACAINE IN DEXTROSE 0.75-8.25 % IT SOLN
INTRATHECAL | Status: DC | PRN
  Administered 2017-04-25: 1.6 mL via INTRATHECAL

## 2017-04-25 MED ORDER — FUROSEMIDE 10 MG/ML IJ SOLN
INTRAMUSCULAR | Status: AC
  Administered 2017-04-25: 20 mg via INTRAVENOUS
  Filled 2017-04-25: qty 4

## 2017-04-25 MED ORDER — SODIUM CHLORIDE 0.9 % IV BOLUS
250.0000 mL | Freq: Once | INTRAVENOUS | Status: AC
  Administered 2017-04-25: 250 mL via INTRAVENOUS

## 2017-04-25 MED ORDER — DEXMEDETOMIDINE HCL IN NACL 200 MCG/50ML IV SOLN: 0.0000 ug/kg/h | INTRAVENOUS | Status: DC

## 2017-04-25 MED ORDER — 0.9 % SODIUM CHLORIDE (POUR BTL) OPTIME
TOPICAL | Status: DC | PRN
  Administered 2017-04-25: 1000 mL

## 2017-04-25 MED ORDER — FENTANYL CITRATE (PF) 100 MCG/2ML IJ SOLN: 50.0000 ug | INTRAMUSCULAR | Status: DC | PRN

## 2017-04-25 MED ORDER — ONDANSETRON HCL 4 MG/2ML IJ SOLN
INTRAMUSCULAR | Status: DC | PRN
  Administered 2017-04-25: 4 mg via INTRAVENOUS

## 2017-04-25 MED ORDER — ONDANSETRON HCL 4 MG/2ML IJ SOLN: 4.0000 mg | Freq: Four times a day (QID) | INTRAMUSCULAR | Status: DC | PRN

## 2017-04-25 MED ORDER — DOCUSATE SODIUM 100 MG PO CAPS: 100.0000 mg | ORAL_CAPSULE | Freq: Two times a day (BID) | ORAL | Status: DC

## 2017-04-25 MED ORDER — ORAL CARE MOUTH RINSE: 15.0000 mL | Freq: Four times a day (QID) | OROMUCOSAL | Status: DC

## 2017-04-25 MED ORDER — DOCUSATE SODIUM 50 MG/5ML PO LIQD
100.0000 mg | Freq: Two times a day (BID) | ORAL | Status: DC | PRN
  Administered 2017-04-30: 100 mg
  Filled 2017-04-25: qty 10

## 2017-04-25 MED ORDER — FUROSEMIDE 10 MG/ML IJ SOLN
20.0000 mg | Freq: Once | INTRAMUSCULAR | Status: AC
  Administered 2017-04-25: 20 mg via INTRAVENOUS

## 2017-04-25 MED ORDER — NALOXONE HCL 0.4 MG/ML IJ SOLN: 0.4000 mg | Freq: Once | INTRAMUSCULAR | Status: DC

## 2017-04-25 MED ORDER — CEFAZOLIN SODIUM-DEXTROSE 2-3 GM-%(50ML) IV SOLR
INTRAVENOUS | Status: DC | PRN
  Administered 2017-04-25: 2 g via INTRAVENOUS

## 2017-04-25 SURGICAL SUPPLY — 47 items
BIT DRILL AO GAMMA 4.2X180 (BIT) ×2 IMPLANT
BRUSH SCRUB SURG 4.25 DISP (MISCELLANEOUS) ×2 IMPLANT
CHLORAPREP W/TINT 26ML (MISCELLANEOUS) ×1 IMPLANT
COVER MAYO STAND STRL (DRAPES) ×2 IMPLANT
COVER PERINEAL POST (MISCELLANEOUS) ×3 IMPLANT
COVER SURGICAL LIGHT HANDLE (MISCELLANEOUS) ×3 IMPLANT
DERMABOND ADVANCED (GAUZE/BANDAGES/DRESSINGS) ×2
DERMABOND ADVANCED .7 DNX12 (GAUZE/BANDAGES/DRESSINGS) ×1 IMPLANT
DRAPE HALF SHEET 40X57 (DRAPES) ×6 IMPLANT
DRAPE HIP W/POCKET STRL (DRAPE) ×2 IMPLANT
DRAPE IMP U-DRAPE 54X76 (DRAPES) ×3 IMPLANT
DRAPE INCISE IOBAN 66X45 STRL (DRAPES) ×3 IMPLANT
DRAPE STERI IOBAN 125X83 (DRAPES) ×1 IMPLANT
DRAPE SURG 17X23 STRL (DRAPES) ×6 IMPLANT
DRAPE U-SHAPE 47X51 STRL (DRAPES) ×3 IMPLANT
DRSG MEPILEX BORDER 4X4 (GAUZE/BANDAGES/DRESSINGS) ×7 IMPLANT
DRSG MEPILEX BORDER 4X8 (GAUZE/BANDAGES/DRESSINGS) ×3 IMPLANT
DURAPREP 26ML APPLICATOR (WOUND CARE) ×2 IMPLANT
ELECT REM PT RETURN 9FT ADLT (ELECTROSURGICAL) ×3
ELECTRODE REM PT RTRN 9FT ADLT (ELECTROSURGICAL) ×1 IMPLANT
GLOVE BIO SURGEON STRL SZ7.5 (GLOVE) ×12 IMPLANT
GLOVE BIOGEL PI IND STRL 7.5 (GLOVE) ×1 IMPLANT
GLOVE BIOGEL PI INDICATOR 7.5 (GLOVE) ×2
GOWN STRL REUS W/ TWL LRG LVL3 (GOWN DISPOSABLE) ×1 IMPLANT
GOWN STRL REUS W/TWL LRG LVL3 (GOWN DISPOSABLE) ×2
GUIDEROD T2 3X1000 (ROD) ×2 IMPLANT
K-WIRE  3.2X450M STR (WIRE) ×2
K-WIRE 3.2X450M STR (WIRE) ×1
KIT BASIN OR (CUSTOM PROCEDURE TRAY) ×3 IMPLANT
KIT TURNOVER KIT B (KITS) ×3 IMPLANT
KWIRE 3.2X450M STR (WIRE) IMPLANT
LINER BOOT UNIVERSAL DISP (MISCELLANEOUS) ×1 IMPLANT
MANIFOLD NEPTUNE II (INSTRUMENTS) ×3 IMPLANT
NAIL GAMMA LG R 5TI 10X360X125 (Nail) ×2 IMPLANT
NS IRRIG 1000ML POUR BTL (IV SOLUTION) ×3 IMPLANT
PACK GENERAL/GYN (CUSTOM PROCEDURE TRAY) ×3 IMPLANT
PAD ARMBOARD 7.5X6 YLW CONV (MISCELLANEOUS) ×6 IMPLANT
SCREW LAG GAMMA 3 TI 10.5X90MM (Screw) ×2 IMPLANT
SCREW LOCKING T2 F/T  5MMX40MM (Screw) ×2 IMPLANT
SCREW LOCKING T2 F/T 5MMX40MM (Screw) IMPLANT
SUT MNCRL AB 3-0 PS2 18 (SUTURE) ×5 IMPLANT
SUT VIC AB 0 CT1 27 (SUTURE)
SUT VIC AB 0 CT1 27XBRD ANBCTR (SUTURE) IMPLANT
SUT VIC AB 2-0 CT1 27 (SUTURE) ×2
SUT VIC AB 2-0 CT1 TAPERPNT 27 (SUTURE) ×2 IMPLANT
TOWEL OR 17X26 10 PK STRL BLUE (TOWEL DISPOSABLE) ×6 IMPLANT
WATER STERILE IRR 1000ML POUR (IV SOLUTION) ×3 IMPLANT

## 2017-04-25 NOTE — Progress Notes (Signed)
RT NOTE:  ABG drawn from [facility]. Results loaded to Epic.

## 2017-04-25 NOTE — Op Note (Signed)
DATE OF SURGERY:  04/25/2017  TIME: 2:58 PM  PATIENT NAME:  [name]  AGE: 84 y.o.  PRE-OPERATIVE DIAGNOSIS:  RIGHT TROCH FRACTURE  POST-OPERATIVE DIAGNOSIS:  SAME  PROCEDURE:  INTRAMEDULLARY (IM) NAIL INTERTROCHANTRIC  SURGEON:  Dandra Shambaugh D  ASSISTANT:  Aquilla HackerHenry Martensen, PA-C, he was present and scrubbed throughout the case, critical for completion in a timely fashion, and for retraction, instrumentation, and closure.   OPERATIVE IMPLANTS: [employer] Gamma Nail  PREOPERATIVE INDICATIONS:  Melissa Rakersnn B Hennon is a 84 y.o. year old who fell and suffered a hip fracture. She was brought into the ER and then admitted and optimized and then elected for surgical intervention.    The risks benefits and alternatives were discussed with the patient including but not limited to the risks of nonoperative treatment, versus surgical intervention including infection, bleeding, nerve injury, malunion, nonunion, hardware prominence, hardware failure, need for hardware removal, blood clots, cardiopulmonary complications, morbidity, mortality, among others, and they were willing to proceed.    OPERATIVE PROCEDURE:  The patient was brought to the operating room and placed in the supine position. General anesthesia was administered. She was placed on the fracture table.  Closed reduction was performed under C-arm guidance. Time out was then performed after sterile prep and drape. She received preoperative antibiotics.  Incision was made proximal to the greater trochanter. A guidewire was placed in the appropriate position. Confirmation was made on AP and lateral views. The above-named nail was opened. I opened the proximal femur with a reamer. I then placed the nail by hand easily down. I did not need to ream the femur.  Once the nail was completely seated, I placed a guidepin into the femoral head into the center center position. I measured the length, and then reamed the lateral cortex and up into the  head. I then placed the lag screw. Slight compression was applied. Anatomic fixation achieved. Bone quality was mediocre.  I then secured the proximal interlocking bolt, and took off a half a turn, and then removed the instruments, and took final C-arm pictures AP and lateral the entire length of the leg.  I then used perfect circles technique to place a distal interlock screw.   Anatomic reconstruction was achieved, and the wounds were irrigated copiously and closed with Vicryl followed by staples and sterile gauze for the skin. The patient was awakened and returned to PACU in stable and satisfactory condition. There no complications and the patient tolerated the procedure well.  She will be weightbearing as tolerated, and will be on chemical px  for a period of four weeks after discharge.   [name], M.D.

## 2017-04-25 NOTE — Transfer of Care (Deleted)
Immediate Anesthesia Transfer of Care Note  Patient: [name]  Procedure(s) Performed: INTRAMEDULLARY (IM) NAIL INTERTROCHANTRIC (Right )  Patient Location: PACU  Anesthesia Type:General  Level of Consciousness: awake, alert  and oriented  Airway & Oxygen Therapy: Patient Spontanous Breathing and Patient connected to nasal cannula oxygen  Post-op Assessment: Report given to RN, Post -op Vital signs reviewed and stable and Patient moving all extremities X 4  Post vital signs: Reviewed and stable  Last Vitals:  Vitals Value Taken Time  BP    Temp    Pulse    Resp    SpO2      Last Pain:  Vitals:   04/25/17 0602  TempSrc: Oral  PainSc:          Complications: No apparent anesthesia complications

## 2017-04-25 NOTE — Transfer of Care (Signed)
Immediate Anesthesia Transfer of Care Note  Patient: [name]  Procedure(s) Performed: INTRAMEDULLARY (IM) NAIL INTERTROCHANTRIC (Right )  Patient Location: PACU  Anesthesia Type:MAC and Spinal  Level of Consciousness: drowsy  Airway & Oxygen Therapy: Patient Spontanous Breathing and Patient connected to face mask oxygen  Post-op Assessment: Report given to RN and Post -op Vital signs reviewed and stable  Post vital signs: Reviewed and stable  Last Vitals:  Vitals Value Taken Time  BP 124/67 04/25/2017  3:31 PM  Temp 36.8 C 04/25/2017  3:30 PM  Pulse 112 04/25/2017  3:37 PM  Resp 17 04/25/2017  3:37 PM  SpO2 93 % 04/25/2017  3:37 PM  Vitals shown include unvalidated device data.  Last Pain:  Vitals:   04/25/17 1530  TempSrc:   PainSc: (P) Asleep         Complications: No apparent anesthesia complications

## 2017-04-25 NOTE — Anesthesia Preprocedure Evaluation (Addendum)
Anesthesia Evaluation  Patient identified by MRN, date of birth, ID band Patient confused    Reviewed: Allergy & Precautions, NPO status , Patient's Chart, lab work & pertinent test results  Airway Mallampati: II  TM Distance: >3 FB Neck ROM: Full    Dental no notable dental hx.    Pulmonary neg pulmonary ROS, former smoker,    Pulmonary exam normal breath sounds clear to auscultation       Cardiovascular hypertension, + CAD, + CABG, + Peripheral Vascular Disease and +CHF  + Valvular Problems/Murmurs MR  Rhythm:Regular Rate:Normal + Systolic murmurs - Left ventricle: The cavity size was normal. Wall thickness was   normal. Systolic function was normal. The estimated ejection   fraction was in the range of 60% to 65%. Images were inadequate   for LV wall motion assessment. - Aortic valve: There was trivial regurgitation. - Mitral valve: s/p annuloplasty repair. No significant stenosis.   Mild regurgitation. Valve area by continuity equation (using LVOT   flow): 1.68 cm^2. - Left atrium: The atrium was normal in size. - Right ventricle: The cavity size was mildly dilated. Moderately   reduced RV systolic function. - Right atrium: The atrium was normal in size. - Tricuspid valve: There was moderate regurgitation. - Pulmonary arteries: PA peak pressure: 75 mm Hg (S). - Inferior vena cava: The vessel was dilated. The respirophasic   diameter changes were blunted (< 50%), consistent with elevated   central venous pressure. - Pericardium, extracardiac: Ascites is noted.    Neuro/Psych Dementia -Unfortunately, the patient is fairly obstinate regarding her wishes and she is refusing all care regarding her hip -She does not appear to have capacity - despite knowing where she is and somewhat appearing to understand that she has a hip fracture, she was not in any way able to logically say what would happen if the hip is not repaired -I  explained to her that opting not to repair the hip will necessitate lifelong SNF placement and likely chronic pain -Despite this, she remained unable to understand the circumstances and unwilling to accept any care -As a result of this very difficult and tragic situation, we have requested BH consultation to help to declare the patient incapable of making decisions -Even more tragically, her husband appears to also have difficulty in guiding decisions in this circumstance -Her son is out of the [place] right now but may be the most appropriate appointed HCPOA -She may require temporary guardianship -For now will make NPO after midnight in case surgery can somehow be performed tomorrow -Suspect frontotemporal dementia given the hallucinations -She is likely to respond to antipsychotic medication - if we are able to administer it  negative neurological ROS  negative psych ROS   GI/Hepatic negative GI ROS, Neg liver ROS,   Endo/Other  negative endocrine ROS  Renal/GU Renal InsufficiencyRenal disease  negative genitourinary   Musculoskeletal negative musculoskeletal ROS (+)   Abdominal   Peds negative pediatric ROS (+)  Hematology  (+) anemia ,   Anesthesia Other Findings   Reproductive/Obstetrics negative OB ROS                            Anesthesia Physical Anesthesia Plan  ASA: IV  Anesthesia Plan: General   Post-op Pain Management:    Induction: Intravenous  PONV Risk Score and Plan: 3 and Ondansetron and Dexamethasone  Airway Management Planned: Oral ETT  Additional Equipment:   Intra-op Plan:  Post-operative Plan: Extubation in OR  Informed Consent: I have reviewed the patients History and Physical, chart, labs and discussed the procedure including the risks, benefits and alternatives for the proposed anesthesia with the patient or authorized representative who has indicated his/her understanding and acceptance.   Dental advisory  given  Plan Discussed with: CRNA and Surgeon  Anesthesia Plan Comments:         Anesthesia Quick Evaluation

## 2017-04-25 NOTE — Progress Notes (Signed)
[facility] notified of patient f/u bp after bolus 93/71 HR 90. Ilean SkillVeronica Ady Heimann LPN

## 2017-04-25 NOTE — Anesthesia Procedure Notes (Signed)
Spinal  Patient location during procedure: OR Start time: 04/25/2017 2:14 PM End time: 04/25/2017 2:17 PM Staffing Anesthesiologist: Val EagleMoser, Kiani Wurtzel, MD Preanesthetic Checklist Completed: patient identified, surgical consent, pre-op evaluation, timeout performed, IV checked, risks and benefits discussed and monitors and equipment checked Spinal Block Patient position: right lateral decubitus Prep: site prepped and draped and DuraPrep Patient monitoring: heart rate, cardiac monitor, continuous pulse ox and blood pressure Approach: midline Location: L3-4 Injection technique: single-shot Needle Needle type: Pencan  Needle gauge: 24 G Needle length: 10 cm Assessment Sensory level: T6

## 2017-04-25 NOTE — Progress Notes (Signed)
Patient was cleared to have surgery today. IR made aware of pending surgery and will attempt thoracentesis tomorrow. Patient being transferred for surgery at this time.

## 2017-04-25 NOTE — Anesthesia Procedure Notes (Signed)
Procedure Name: Intubation Date/Time: 04/25/2017 6:54 PM Performed by: Oleta Mouse, MD Pre-anesthesia Checklist: Patient identified, Emergency Drugs available, Suction available, Patient being monitored and Timeout performed Patient Re-evaluated:Patient Re-evaluated prior to induction Oxygen Delivery Method: Ambu bag Preoxygenation: Pre-oxygenation with 100% oxygen Induction Type: IV induction Laryngoscope Size: Mac and 3 Grade View: Grade I Tube type: Subglottic suction tube Tube size: 7.5 mm Number of attempts: 1 Placement Confirmation: ETT inserted through vocal cords under direct vision,  CO2 detector and breath sounds checked- equal and bilateral Secured at: 20 cm Tube secured with: Tape Dental Injury: Teeth and Oropharynx as per pre-operative assessment

## 2017-04-25 NOTE — Progress Notes (Signed)
[facility] notified of f/u bp after bolus 82/64 hr 105

## 2017-04-25 NOTE — Anesthesia Procedure Notes (Signed)
Procedure Name: MAC Date/Time: 04/25/2017 2:02 PM Performed by: Inda Coke, CRNA Pre-anesthesia Checklist: Patient identified, Emergency Drugs available, Suction available, Timeout performed and Patient being monitored Patient Re-evaluated:Patient Re-evaluated prior to induction Oxygen Delivery Method: Simple face mask Induction Type: IV induction Dental Injury: Teeth and Oropharynx as per pre-operative assessment

## 2017-04-25 NOTE — Anesthesia Procedure Notes (Signed)
Arterial Line Insertion Start/End4/11/2017 7:36 PM, 04/25/2017 7:40 PM Performed by: Rosalio MacadamiaHayes, Christine T, CRNA, CRNA  Patient location: PACU. Preanesthetic checklist: patient identified, IV checked, site marked, risks and benefits discussed, surgical consent, monitors and equipment checked, pre-op evaluation, timeout performed and anesthesia consent Lidocaine 1% used for infiltration Left, radial was placed Catheter size: 20 G Hand hygiene performed  and maximum sterile barriers used   Attempts: 1 Procedure performed without using ultrasound guided technique. Following insertion, dressing applied and Biopatch. Post procedure assessment: normal and unchanged  Patient tolerated the procedure well with no immediate complications. Additional procedure comments: Preformed by Everlene Ballshristine Hayes CRNA.

## 2017-04-25 NOTE — Progress Notes (Signed)
Name: [name] MRN: [id number] DOB: December 20, 1932    ADMISSION DATE:  04/22/2017 CONSULTATION DATE:  04/23/2017  REFERRING MD :  Dr. Janee Morn  CHIEF COMPLAINT:  Hypoxia/ R pleural effusion  HISTORY OF PRESENT ILLNESS:   HPI obtained from medical chart review as patient is confused and not able to given accurate history.  84 year old female with PMH significant for but not limited to former smoker, diastolic HF, CAD s/p MVR and CABG 2008, AAA, HTN, HLD, PVD and questionable dementia who was admitted to [facility] on 4/8 with right intertrochanteric fracture.  On exam, patient is oriented to herself and place however clearly confused, unable to explain why she is hospitalized, denies being married, and is pleasant and inappropriate in her responses.  No family at bedside.    Patient is from home, lives with her husband.  Apparently had mechanical fall yesterday with resultant right hip pain found to have right intertrochanteric fracture.  Family reports several days of confusion, visual hallucinations involving her cat, and worsening edema to lower extremities.  Questionable if patient is compliant with home medications including diuretics.  Additionally, patient was hypoxic initially requiring O2 found to have large right pleural effusion, saturating 100% on 2L.  Labs noted for BNP 3142, negative PCT, Tmax 99.3, normal WBC.  Patient has been refusing most care, including surgery, and her competency in making decisions is pending psych evaluation.  There is no established HPOA.   Additionally husband has been having difficulty in guiding decision making and their son is out of the country.     4/11 I was asked to ress the patient by the hospitalist He was concerned that the patient may need a thoracentesis. The patient just had her right hip surgery. She is pale and poorly responsive. She had 25 mcg of Fentanyl durng the intervention. She is grossly hypopneic and remains poorly responsive. She got 25  mcg opf propofol as well. We are going to check her Hb as she appears pale. I gave her 2 amps of .04 narcan as well. Her BP is ok. Her heart rate is about 115 and Bp is about 125/70.  PAST MEDICAL HISTORY :   has a past medical history of AAA (abdominal aortic aneurysm) (HCC), Carotid artery stenosis, Chronic diastolic heart failure (HCC), Coronary atherosclerosis of native coronary artery, Dementia, Hypercholesteremia, Hypertension, essential, benign, PVD (peripheral vascular disease) (HCC), and Rhinitis, allergic.  has a past surgical history that includes Mitral valve repair (05/22/2006); Coronary artery bypass graft (05/22/2006); Carotid endarterectomy (03/2008); and Colonoscopy (1996). Prior to Admission medications   Medication Sig Start Date End Date Taking? Authorizing Provider  amLODipine (NORVASC) 5 MG tablet TAKE 1 TABLET (5 MG TOTAL) BY MOUTH DAILY. 02/13/16  Yes Lewayne Bunting, MD  aspirin 81 MG tablet Take 81 mg by mouth daily.     Yes [provider]  atorvastatin (LIPITOR) 40 MG tablet Take 1 tablet (40 mg total) by mouth daily at 6 PM. NEED OV. 01/07/17  Yes Crenshaw, [name], MD  enalapril (VASOTEC) 20 MG tablet TAKE 1 TABLET (20 MG TOTAL) BY MOUTH 2 (TWO) TIMES DAILY. NEED OV. Patient taking differently: Take 20 mg by mouth daily.  04/01/17  Yes Lewayne Bunting, MD  furosemide (LASIX) 40 MG tablet TAKE 1 TABLET BY MOUTH EVERY DAY Patient taking differently: 40mg  by mouth daily as needed for fluid 03/23/16  Yes Crenshaw, [name], MD  KLOR-CON M20 20 MEQ tablet TAKE 1 TABLET EVERY DAY -  MUST MAKE APPOINTMENT FOR FURTHER REFILLS 07/10/16  Yes Lewayne Buntingrenshaw, Brian S, MD  metoprolol succinate (TOPROL-XL) 100 MG 24 hr tablet TAKE 1 TABLET (100 MG TOTAL) BY MOUTH DAILY. NEED OV. 04/01/17  Yes Crenshaw, Madolyn FriezeBrian S, MD   No Known Allergies  FAMILY HISTORY:  family history includes Heart attack in her mother. SOCIAL HISTORY:  reports that she quit smoking about 11 years ago. Her  smoking use included cigarettes. She has never used smokeless tobacco. She reports that she drank alcohol. She reports that she does not use drugs.  REVIEW OF SYSTEMS:   Unable to obtain as patient is unable to provide based on acute, questionably chronic confusion.  SUBJECTIVE:  Patient without complaints.  Denies SOB or pain.  VITAL SIGNS: Temp:  [98.2 F (36.8 C)-98.4 F (36.9 C)] 98.2 F (36.8 C) (04/11 1530) Pulse Rate:  [90-123] 99 (04/11 1620) Resp:  [9-20] 14 (04/11 1620) BP: (81-137)/(52-77) 137/77 (04/11 1615) SpO2:  [83 %-100 %] 83 % (04/11 1620) Weight:  [101 lb 3.1 oz (45.9 kg)] 101 lb 3.1 oz (45.9 kg) (04/11 1258)  PHYSICAL EXAMINATION: General:  Frail, thin, elderly female lying in bed in NAD HEENT: MM pink/dry, pupils 3/ reactive  Neuro: Patient significantly obtunded, non-responsive CV: RRR, +S4 PULM: using accessory resp. Mm, poor resp effort,   GI: soft, non-tender, bs active  Extremities: warm/dry, BLE 3+ edema, Right leg shortened and internally rotated Skin: no rashes  Recent Labs  Lab 04/23/17 1453 04/24/17 0529 04/25/17 0413  NA 140 142 143  K 5.0 4.9 4.2  CL 103 100* 98*  CO2 25 33* 37*  BUN 24* 20 16  CREATININE 0.87 0.84 0.71  GLUCOSE 100* 83 100*   Recent Labs  Lab 04/24/17 0529 04/24/17 1522 04/25/17 0413  HGB 9.9* 9.9* 9.0*  HCT 37.7 36.3 34.2*  WBC 6.5 7.0 5.6  PLT 132* 149* 144*   Dg Chest Port 1 View  Result Date: 04/25/2017 CLINICAL DATA:  Shortness of Breath EXAM: PORTABLE CHEST 1 VIEW COMPARISON:  04/23/2017 FINDINGS: Changes of median sternotomy and valve replacement. Moderate to large right effusion is similar or slightly improved since prior study. Diffuse right lung airspace disease. Small to moderate left effusion, new since prior study with left lower lobe atelectasis or infiltrate. IMPRESSION: Moderate right effusion, slightly improved since prior study. New small to moderate left effusion. Bilateral airspace disease  which could reflect atelectasis or pneumonia, right greater than left. Electronically Signed   By: Charlett NoseKevin  Dover M.D.   On: 04/25/2017 08:59   Dg C-arm 1-60 Min  Result Date: 04/25/2017 CLINICAL DATA:  Right intertrochanteric femur fracture ORIF. EXAM: DG C-ARM 61-120 MIN; RIGHT FEMUR 2 VIEWS COMPARISON:  Right hip x-rays dated April 22, 2017. FINDINGS: Intraoperative x-rays demonstrate interval placement of a right femoral cephalomedullary rod with distal interlocking screw. Near anatomic alignment of the dominant intertrochanteric fracture fragments. The lesser trochanter remains mildly displaced medially. Right hip osteoarthritis. Vascular calcifications. IMPRESSION: 1. Improved alignment of the right intertrochanteric femur fracture status post ORIF. FLUOROSCOPY TIME:  1 minutes, 37 seconds. C-arm fluoroscopic images were obtained intraoperatively and submitted for post operative interpretation. Electronically Signed   By: Obie DredgeWilliam T Derry M.D.   On: 04/25/2017 15:46   Dg Femur, Min 2 Views Right  Result Date: 04/25/2017 CLINICAL DATA:  Right intertrochanteric femur fracture ORIF. EXAM: DG C-ARM 61-120 MIN; RIGHT FEMUR 2 VIEWS COMPARISON:  Right hip x-rays dated April 22, 2017. FINDINGS: Intraoperative x-rays demonstrate interval placement of a  right femoral cephalomedullary rod with distal interlocking screw. Near anatomic alignment of the dominant intertrochanteric fracture fragments. The lesser trochanter remains mildly displaced medially. Right hip osteoarthritis. Vascular calcifications. IMPRESSION: 1. Improved alignment of the right intertrochanteric femur fracture status post ORIF. FLUOROSCOPY TIME:  1 minutes, 37 seconds. C-arm fluoroscopic images were obtained intraoperatively and submitted for post operative interpretation. Electronically Signed   By: Obie Dredge M.D.   On: 04/25/2017 15:46    SIGNIFICANT EVENTS  4/8 Admit  STUDIES:  CXR 4/8 hip >> Comminuted intratrochanteric fracture  of the proximal right femur.  CXR 4/9 >> Progressive enlargement of right-sided pleural effusion now moderate to large in size with associated right lung atelectasis.  Mild atelectasis medial aspect left lung base.  Pulmonary vascular congestion.  Cardiomegaly post CABG and valve replacement  ECHO:Echo with preserved EF and severe pulmonary hypertension with estimated RV systolic pressure 75 mmHg.Suspect acute on chronic diastolic heart failure.  Severe pulmonary hypertension.The pulmonary HTN is likely related to her chronic MR as per cardiology.   BRIEF PATIENT DESCRIPTION:  ASSESSMENT / PLAN: Patient with dementia. Newly s/p right hip surgery. She is hypopneic and doesn't look well. We are in the midst of deciding where she will go post-op.   Acute  respiratory failure  likely in the setting of acute on chronic diastolic HF in the setting of question compliance with home meds, BNP 3142. PCT negative, no suggestive symptoms of infectious process  currently she is maintaining on minimal O2 requirements with no significant increase in WOB. I have just placed the patient on BIPAP until when and if she arouses to support her breathing. It appears more and more that the patient may require reintubation at this point.    Pleural effusions The patient appears to have diastolic heart failure and bilateral effusions.  It is probably due to CHF. There may be some compression atelectasis as well. May need a thoracentesis at some point but cannot pursue at this time.  Altered mental status We are assuming at this point that the patient developed acute hypercarbic resp failure in the setting of her surgery perhaps secondary  To the sedating medications ( although minimal as they were). There is a possibility of superimposed sepsis, pneumonia, atelctasis ,CVA( perhaps introperative). May need some additional time to ascertain what has transpired here,   BP The patient is running a liitle more  hypotensive since being placed on BIPAP.  I am attributing it to her significant pulmonary HTN and the positive pressure ventilation. May have to be a little cautious with diuresis at this point. The patient may need pressors for the time being.  I have discussed the situation with the patient's husband and daughter-in-law.

## 2017-04-25 NOTE — Progress Notes (Signed)
PROGRESS NOTE    [name]  WUJ:[id number] DOB: [birth year] DOA: 04/22/2017 PCP: No primary care provider on file.  Brief Narrative:  Is a 84 year old female history of coronary artery disease, prior CABG, history of mitral valve repair, hypertension, dyslipidemia, peripheral vascular disease, carotid artery stenosis, AAA, history of chronic diastolic heart failure associated pulmonary hypertension presents to the ED after mechanical fall and noted to have a right intertrochanteric proximal Femur Fx.    Patient also noted to be hypoxemic with sats dropping in the 70s.  Chest x-ray and physical exam concerning for volume overload.  Patient also noted to have a right-sided pleural effusion and lower extremity edema.  BNP significantly elevated.  Patient with borderline blood pressure and as such diuretics were held.  Cardiac enzymes cycled with initial enzyme elevated at 0.42.    Cardiology consulted for further evaluation and cardiac clearance.  Pulmonary also consulted due to moderate size right-sided pleural effusion and recommended diuresis and IR Thoracentesis.  Orthopedics consulted to repair hip fracture and patient to go for surgery today. IR consulted for Right Thoracentesis prior to Surgery however not able to be done so will be done tomorrow after Right Intramedullary Intertrochanteric Nail.   Assessment & Plan:   Principal Problem:   Closed displaced intertrochanteric fracture of right femur (HCC) Active Problems:   H/O mitral valve repair   Hypertension, essential, benign   Acute on chronic diastolic (congestive) heart failure (HCC)   Dyslipidemia, goal LDL below 70   PVD (peripheral vascular disease) (HCC)   Dementia   AAA (abdominal aortic aneurysm) (HCC)   Acute respiratory failure with hypoxia (HCC)   CKD (chronic kidney disease), stage III (HCC)   Carotid artery stenosis   Pulmonary hypertension (HCC)   Former tobacco use   Hx of CABG   Pleural effusion, right  Preoperative respiratory examination   Evaluation by psychiatric service required   Hypoxia  Closed Right Intertrochanteric Fx of the Right Proximal Right Femur -Questionable etiology.   -Unknown as to whether it was secondary to mechanical fall.   -Hip X-Ray showed Pelvic ring is intact. Comminuted intratrochanteric fracture of the proximal right femur is noted with impaction and angulation at the fracture site. No soft tissue changes are seen. Diffuse vascular calcifications are noted. -Cardiac enzymes are being cycled and initial troponin is slightly elevated.   -Patient in consultation by Orthopedics and plan is to have Right Hip Repaired after clearance by Cardiology  -Pulmonary evaluated and feel as if she is at High Risk for any respiratory complication such as PNA, Collapse but is at Moderate Risk for Prolonged Vent Dependence after Surgery  -Pain Control with Robaxin 500 mg po/IV q6hprn Muscle Spasms, Morphine 1 mg q2hprn Severe Pain, and Oxycodone 5-10 mg po q4hprn Moderate Pain -Patient to undergo Intramedullary Intertrochanteric Nailing today   Acute on chronic respiratory failure/hypoxia/shortness of breath likely secondary to right pleural effusion and CHF  -Patient noted to be hypoxic a few nights and noted to have some sats in the 80s.  Patient however not in distress. -Chest x-ray was ordered 04/23/17 morning concerning for moderate right-sided pleural effusion.  Checked LDH and was 666.  -Pulmonary evaluated and feel Right Pleural Effusion is Small to Moderate and feel like it is Cardiac Related -Pulmonary Recommending Diuresis and IR Guided Thoracentesis; -Per Pulmonary High Risk for for any Respiratory Complication but Moderate Risk for Prolonged Vent Dependence after Surgery  -IR Consulted for Right Thoracentesis however was unfortunately not able to  be done prior to Surgery but will be done after. Pulmonary had cleared patient from a Lung standpoint for Surgery and risk as  above.  -Diuresis per Cardiology and was stopped as patient may have been overdiuresed   -Thoracentesis to be done tomorrow   Elevated Troponin  -Cardiology following and feel it is 2/2 to Demand Ischemia -Troponin Elevated to 0.45 -No Active Chest Pain  -Transthoracic Echo has been obtained EF of 60-65%. -Trivial aortic valvular regurgitation.  Images were inadequate for left ventricular wall motion assessment. Left ventricular systolic function normal.   Chronic Kidney Disease Stage III -Stable. BUN/Cr went from 24/0.87 -> 20/0.84 -> 16/0.71 -Continue to Monitor Renal Fxn Carefully as patient is being Diuresed  Dementia with Psychotic Features -Patient noted to be demented on admission and felt not to have capacity per Psychiatry -Concern for frontotemporal dementia given hallucinations. -Psychiatry was consulted and patient deemed not to have capacity.   -Appreciated Psychiatric input and recommendations. -C/w Delirium Precautions   HTN -Blood pressure borderline but resumed Metoprolol XL 100 mg and now reduced dose to Metoprolol XL 50 mg po Daily  -C/w Diuresis per Cardiology -BP this AM was 94/67 -Continue to Monitor extremely closely   Acute Decompensation of Chronic Diastolic Heart Failure -BNP elevated at 3142.4.   -Blood pressure was borderline and as such oral diuretics were discontinued but was getting IV Diuresis per Cardiology but now Stopped likely 2/2 overdiuresis  -Patient with Troponin level of 0.42 and increased to 0.45. -Transthoracic Echo with a EF of 60-65%.  Images inadequate to assess left ventricular wall motion but did show severe PHTN with an estimated RV Systolic Pressure of 75 mmHg -Strict I's/O's, Daily Weights, SLIV and 1500 mL Fluid Restriction -Cardiology consulted for further evaluation and recommendations  -Recommended continuing IV Lasix 40 mg BID but now Stopped as she was hypotensive overnight and had to be given some fluid  -Metoprolol  Succinate 100 mg po Daily reduced to 50 mg po Daily  -LE Duplex Negative for DVT  Hyperbilirubinemia -T Bili was elevated on Admission at 1.9 -Repeat CMP this AM showed 1.2 this AM   Thrombocytopenia -Mild. Platelet Count improving and went from 132 -> 149 -> 144 -Continue to Monitor and repeat CBC in AM  Normocytic Anemia -Patient's Hb/Hct stable at 9.9/36.3 -> 9.0/34.2 -Continue to Monitor for S/Sx of Bleeding -Repeat CBC in AM   Abnormal LFT's -Mildly elevated  AST at 54 but now improved to 16 -Repeat CMP in AM   Hypophosphatemia -Patient's Phos Level was 2.4 -Continue to Monitor and if still low in AM will replete -Repeat Phos Level in AM   DVT prophylaxis: Enoxaparin 30 mg sq q24h Code Status: FULL CODE Family Communication: Discussed with Husband and daughter at bedside  Disposition Plan: Remain Inpatient for Surgery   Consultants:   Orthopedic Surgery  Cardiology  PCCM/Pulmonary   Psychiatry   Interventional Radiology    Procedures:  BILATERAL LE VENOUS DUPLEX Bilateral lower extremity venous duplex has been completed. Negative for obvious evidence of DVT.   Antimicrobials:  Anti-infectives (From admission, onward)   Start     Dose/Rate Route Frequency Ordered Stop   04/25/17 1255  ceFAZolin (ANCEF) 2-4 GM/100ML-% IVPB    Note to Pharmacy:  Sabino Niemann   : cabinet override      04/25/17 1255 04/26/17 0059   04/23/17 0600  ceFAZolin (ANCEF) IVPB 2g/100 mL premix     2 g 200 mL/hr over 30 Minutes Intravenous On call  to O.R. 04/23/17 0000 04/24/17 0559     Subjective: Seen and examined at bedside and was more awake today. No CP or SOB. States pain was well controlled. Not oriented today and thought she was [place] No other concerns and unfortunately Thoracentesis was unable to be done pre-operatively and will be done tomorrow.   Objective: Vitals:   04/25/17 0000 04/25/17 0120 04/25/17 0602 04/25/17 1258  BP: (!) 82/64 93/71 94/67      Pulse: (!) 105 90 (!) 101   Resp:   19   Temp:   98.2 F (36.8 C)   TempSrc:   Oral   SpO2:   100%   Weight:    45.9 kg (101 lb 3.1 oz)  Height:    5\' 2"  (1.575 m)    Intake/Output Summary (Last 24 hours) at 04/25/2017 1457 Last data filed at 04/25/2017 1449 Gross per 24 hour  Intake 60 ml  Output 1775 ml  Net -1715 ml   Filed Weights   04/22/17 1546 04/25/17 1258  Weight: 45.9 kg (101 lb 3.1 oz) 45.9 kg (101 lb 3.1 oz)   Examination: Physical Exam:  Constitutional: Thin frail cachectic Caucasian female in NAD appears calm  Eyes: Sclerae anicteric; Lids normal ENMT: External Ears and nose appear normal. MMM Neck: Appears supple with no JVD Respiratory: Diminished to auscultation bilaterally but worse on the Right with some crackles. No appreciable wheezing/rales/rhonchi; No accessory muscle use Cardiovascular: RRR; No appreciable m/r/g. No LE Edema Abdomen: Soft, NT, ND. Bowel sounds present GU: Deferred Musculoskeletal: Right hip not as painful. No Cyanosis Skin: No appreciable rashes or lesions on a limited skin eval; Warm and dry  Neurologic: CN 2-12 grossly intact with no appreciable focal deficits Psychiatric: Awake and alert but not fully oriented. Pleasant mood and affect  Data Reviewed: I have personally reviewed following labs and imaging studies  CBC: Recent Labs  Lab 04/22/17 1120 04/23/17 0842 04/24/17 0529 04/24/17 1522 04/25/17 0413  WBC 7.9 9.5 6.5 7.0 5.6  NEUTROABS 6.6 8.2*  --   --  4.4  HGB 12.0 11.2* 9.9* 9.9* 9.0*  HCT 44.8 42.0 37.7 36.3 34.2*  MCV 82.2 83.2 84.7 84.8 85.3  PLT 173 153 132* 149* 144*   Basic Metabolic Panel: Recent Labs  Lab 04/22/17 1120 04/23/17 0842 04/23/17 1453 04/24/17 0529 04/25/17 0413  NA 141  --  140 142 143  K 4.4  --  5.0 4.9 4.2  CL 101  --  103 100* 98*  CO2 28  --  25 33* 37*  GLUCOSE 117*  --  100* 83 100*  BUN 20  --  24* 20 16  CREATININE 1.03*  --  0.87 0.84 0.71  CALCIUM 9.1  --  8.6* 8.7*  8.5*  MG  --  1.9  --  2.0 1.7  PHOS  --   --   --   --  2.4*   GFR: Estimated Creatinine Clearance: 39.3 mL/min (by C-G formula based on SCr of 0.71 mg/dL). Liver Function Tests: Recent Labs  Lab 04/23/17 0925 04/25/17 0413  AST 54* 16  ALT 17 12*  ALKPHOS 35* 33*  BILITOT 1.9* 1.2  PROT 5.0* 5.1*  ALBUMIN 2.5* 2.4*   No results for input(s): LIPASE, AMYLASE in the last 168 hours. No results for input(s): AMMONIA in the last 168 hours. Coagulation Profile: Recent Labs  Lab 04/22/17 1120  INR 1.23   Cardiac Enzymes: Recent Labs  Lab 04/23/17 1453 04/23/17 2032  TROPONINI 0.42* 0.45*   BNP (last 3 results) No results for input(s): PROBNP in the last 8760 hours. HbA1C: No results for input(s): HGBA1C in the last 72 hours. CBG: Recent Labs  Lab 04/24/17 2024 04/25/17 0018 04/25/17 0403 04/25/17 0846 04/25/17 1148  GLUCAP 113* 96 104* 101* 83   Lipid Profile: No results for input(s): CHOL, HDL, LDLCALC, TRIG, CHOLHDL, LDLDIRECT in the last 72 hours. Thyroid Function Tests: Recent Labs    04/22/17 2156  TSH 1.686   Anemia Panel: No results for input(s): VITAMINB12, FOLATE, FERRITIN, TIBC, IRON, RETICCTPCT in the last 72 hours. Sepsis Labs: Recent Labs  Lab 04/22/17 2156  PROCALCITON <0.10    Recent Results (from the past 240 hour(s))  Surgical pcr screen     Status: None   Collection Time: 04/23/17  1:07 AM  Result Value Ref Range Status   MRSA, PCR NEGATIVE NEGATIVE Final   Staphylococcus aureus NEGATIVE NEGATIVE Final    Comment: (NOTE) The Xpert SA Assay (FDA approved for NASAL specimens in patients 99 years of age and older), is one component of a comprehensive surveillance program. It is not intended to diagnose infection nor to guide or monitor treatment. Performed at [facility], [address] [place], [place] [address]   Urine Culture     Status: None   Collection Time: 04/23/17  8:37 AM  Result Value Ref Range Status    Specimen Description URINE, RANDOM  Final   Special Requests NONE  Final   Culture   Final    NO GROWTH Performed at [facility], [address], [place], [place] [address]    Report Status 04/24/2017 FINAL  Final    Radiology Studies: Dg Chest Port 1 View  Result Date: 04/25/2017 CLINICAL DATA:  Shortness of Breath EXAM: PORTABLE CHEST 1 VIEW COMPARISON:  04/23/2017 FINDINGS: Changes of median sternotomy and valve replacement. Moderate to large right effusion is similar or slightly improved since prior study. Diffuse right lung airspace disease. Small to moderate left effusion, new since prior study with left lower lobe atelectasis or infiltrate. IMPRESSION: Moderate right effusion, slightly improved since prior study. New small to moderate left effusion. Bilateral airspace disease which could reflect atelectasis or pneumonia, right greater than left. Electronically Signed   By: Charlett Nose M.D.   On: 04/25/2017 08:59   Scheduled Meds: . [MAR Hold] aspirin EC  81 mg Oral Daily  . [MAR Hold] atorvastatin  40 mg Oral q1800  . chlorhexidine  60 mL Topical Once  . [MAR Hold] enoxaparin (LOVENOX) injection  30 mg Subcutaneous Q24H  . [MAR Hold] metoprolol succinate  50 mg Oral Daily  . [MAR Hold]  morphine injection  2 mg Intravenous Once  . [MAR Hold] ondansetron (ZOFRAN) IV  4 mg Intravenous Once  . [MAR Hold] potassium chloride SA  20 mEq Oral Daily  . povidone-iodine  2 application Topical Once   Continuous Infusions: . ceFAZolin    . lactated ringers 10 mL/hr at 04/25/17 1249  . [MAR Hold] methocarbamol (ROBAXIN)  IV      LOS: 3 days   Merlene Laughter, DO [facility] Pager [phone]  If 7PM-7AM, please contact night-coverage [email or web address] Password [facility] 04/25/2017, 2:57 PM

## 2017-04-25 NOTE — Progress Notes (Signed)
Patient transported on vent from PACU to 4N-30 without complication.

## 2017-04-25 NOTE — Consult Note (Signed)
Name: [name] MRN: [id number] DOB: 05/10/1932    ADMISSION DATE:  04/22/2017 CONSULTATION DATE:  04/23/2017  REFERRING MD :  Dr. Janee Mornhompson  CHIEF COMPLAINT:  Hypoxia/ R pleural effusion  HISTORY OF PRESENT ILLNESS:   HPI obtained from medical chart review as patient is confused and not able to given accurate history.  84 year old female with PMH significant for but not limited to former smoker, diastolic HF, CAD s/p MVR and CABG 2008, AAA, HTN, HLD, PVD and questionable dementia who was admitted to [facility] on 4/8 with right intertrochanteric fracture.  On exam, patient is oriented to herself and place however clearly confused, unable to explain why she is hospitalized, denies being married, and is pleasant and inappropriate in her responses.  No family at bedside.    Patient is from home, lives with her husband.  Apparently had mechanical fall yesterday with resultant right hip pain found to have right intertrochanteric fracture.  Family reports several days of confusion, visual hallucinations involving her cat, and worsening edema to lower extremities.  Questionable if patient is compliant with home medications including diuretics.  Additionally, patient was hypoxic initially requiring O2 found to have large right pleural effusion, saturating 100% on 2L.  Labs noted for BNP 3142, negative PCT, Tmax 99.3, normal WBC.  Patient has been refusing most care, including surgery, and her competency in making decisions is pending psych evaluation.  There is no established HPOA.   Additionally husband has been having difficulty in guiding decision making and their son is out of the country.    Overnight, there was some concern for decreased responsiveness however exam nonfocal, and found to  blood pressure of 83/62, requiring a 500 NS bolus to improve systolic BP; MAPs remained normal.  On chart review, the only medicine patient has agreed, was 5mg  oxycodone overnight.  Hgb trended from 12 _> 11.2.   BNP  3142, procalcitonin normal.  There was some urinary retention requiring insertion of foley.  UA pending.  PCCM consulted for further assistance.   PAST MEDICAL HISTORY :   has a past medical history of AAA (abdominal aortic aneurysm) (HCC), Carotid artery stenosis, Chronic diastolic heart failure (HCC), Coronary atherosclerosis of native coronary artery, Dementia, Hypercholesteremia, Hypertension, essential, benign, PVD (peripheral vascular disease) (HCC), and Rhinitis, allergic.  has a past surgical history that includes Mitral valve repair (05/22/2006); Coronary artery bypass graft (05/22/2006); Carotid endarterectomy (03/2008); and Colonoscopy (1996). Prior to Admission medications   Medication Sig Start Date End Date Taking? Authorizing Provider  amLODipine (NORVASC) 5 MG tablet TAKE 1 TABLET (5 MG TOTAL) BY MOUTH DAILY. 02/13/16  Yes Lewayne Buntingrenshaw, Brian S, MD  aspirin 81 MG tablet Take 81 mg by mouth daily.     Yes [provider]  atorvastatin (LIPITOR) 40 MG tablet Take 1 tablet (40 mg total) by mouth daily at 6 PM. NEED OV. 01/07/17  Yes Crenshaw, Madolyn FriezeBrian S, MD  enalapril (VASOTEC) 20 MG tablet TAKE 1 TABLET (20 MG TOTAL) BY MOUTH 2 (TWO) TIMES DAILY. NEED OV. Patient taking differently: Take 20 mg by mouth daily.  04/01/17  Yes Lewayne Buntingrenshaw, Brian S, MD  furosemide (LASIX) 40 MG tablet TAKE 1 TABLET BY MOUTH EVERY DAY Patient taking differently: 40mg  by mouth daily as needed for fluid 03/23/16  Yes Crenshaw, Madolyn FriezeBrian S, MD  KLOR-CON M20 20 MEQ tablet TAKE 1 TABLET EVERY DAY -MUST MAKE APPOINTMENT FOR FURTHER REFILLS 07/10/16  Yes Lewayne Buntingrenshaw, Brian S, MD  metoprolol succinate (TOPROL-XL) 100 MG  24 hr tablet TAKE 1 TABLET (100 MG TOTAL) BY MOUTH DAILY. NEED OV. 04/01/17  Yes Crenshaw, [name], MD   No Known Allergies  FAMILY HISTORY:  family history includes Heart attack in her mother. SOCIAL HISTORY:  reports that she quit smoking about 11 years ago. Her smoking use included cigarettes. She has  never used smokeless tobacco. She reports that she drank alcohol. She reports that she does not use drugs.  REVIEW OF SYSTEMS:   Unable to obtain as patient is unable to provide based on acute, questionably chronic confusion.  SUBJECTIVE:  Patient without complaints.  Denies SOB or pain.  VITAL SIGNS: Temp:  [98.2 F (36.8 C)-98.4 F (36.9 C)] 98.2 F (36.8 C) (04/11 1530) Pulse Rate:  [90-123] 99 (04/11 1620) Resp:  [9-20] 14 (04/11 1620) BP: (81-137)/(52-77) 137/77 (04/11 1615) SpO2:  [83 %-100 %] 83 % (04/11 1620) Weight:  [101 lb 3.1 oz (45.9 kg)] 101 lb 3.1 oz (45.9 kg) (04/11 1258)  PHYSICAL EXAMINATION: General:  Frail, thin, elderly female lying in bed in NAD HEENT: MM pink/dry, pupils 3/ reactive  Neuro: intermittently f/c CV: RRR, +S4 PULM: even/non-labored, normal WOB,  lungs bilaterally clear, diminished right base, no rales appreciated.   GI: soft, non-tender, bs active  Extremities: warm/dry, BLE 3+ edema, Right leg shortened and internally rotated Skin: no rashes  Recent Labs  Lab 04/23/17 1453 04/24/17 0529 04/25/17 0413  NA 140 142 143  K 5.0 4.9 4.2  CL 103 100* 98*  CO2 25 33* 37*  BUN 24* 20 16  CREATININE 0.87 0.84 0.71  GLUCOSE 100* 83 100*   Recent Labs  Lab 04/24/17 0529 04/24/17 1522 04/25/17 0413  HGB 9.9* 9.9* 9.0*  HCT 37.7 36.3 34.2*  WBC 6.5 7.0 5.6  PLT 132* 149* 144*   Dg Chest Port 1 View  Result Date: 04/25/2017 CLINICAL DATA:  Shortness of Breath EXAM: PORTABLE CHEST 1 VIEW COMPARISON:  04/23/2017 FINDINGS: Changes of median sternotomy and valve replacement. Moderate to large right effusion is similar or slightly improved since prior study. Diffuse right lung airspace disease. Small to moderate left effusion, new since prior study with left lower lobe atelectasis or infiltrate. IMPRESSION: Moderate right effusion, slightly improved since prior study. New small to moderate left effusion. Bilateral airspace disease which could  reflect atelectasis or pneumonia, right greater than left. Electronically Signed   By: Charlett Nose M.D.   On: 04/25/2017 08:59   Dg C-arm 1-60 Min  Result Date: 04/25/2017 CLINICAL DATA:  Right intertrochanteric femur fracture ORIF. EXAM: DG C-ARM 61-120 MIN; RIGHT FEMUR 2 VIEWS COMPARISON:  Right hip x-rays dated April 22, 2017. FINDINGS: Intraoperative x-rays demonstrate interval placement of a right femoral cephalomedullary rod with distal interlocking screw. Near anatomic alignment of the dominant intertrochanteric fracture fragments. The lesser trochanter remains mildly displaced medially. Right hip osteoarthritis. Vascular calcifications. IMPRESSION: 1. Improved alignment of the right intertrochanteric femur fracture status post ORIF. FLUOROSCOPY TIME:  1 minutes, 37 seconds. C-arm fluoroscopic images were obtained intraoperatively and submitted for post operative interpretation. Electronically Signed   By: Obie Dredge M.D.   On: 04/25/2017 15:46   Dg Femur, Min 2 Views Right  Result Date: 04/25/2017 CLINICAL DATA:  Right intertrochanteric femur fracture ORIF. EXAM: DG C-ARM 61-120 MIN; RIGHT FEMUR 2 VIEWS COMPARISON:  Right hip x-rays dated April 22, 2017. FINDINGS: Intraoperative x-rays demonstrate interval placement of a right femoral cephalomedullary rod with distal interlocking screw. Near anatomic alignment of the dominant intertrochanteric  fracture fragments. The lesser trochanter remains mildly displaced medially. Right hip osteoarthritis. Vascular calcifications. IMPRESSION: 1. Improved alignment of the right intertrochanteric femur fracture status post ORIF. FLUOROSCOPY TIME:  1 minutes, 37 seconds. C-arm fluoroscopic images were obtained intraoperatively and submitted for post operative interpretation. Electronically Signed   By: Obie Dredge M.D.   On: 04/25/2017 15:46    SIGNIFICANT EVENTS  4/8 Admit  STUDIES:  CXR 4/8 hip >> Comminuted intratrochanteric fracture of the  proximal right femur.  CXR 4/9 >> Progressive enlargement of right-sided pleural effusion now moderate to large in size with associated right lung atelectasis.  Mild atelectasis medial aspect left lung base.  Pulmonary vascular congestion.  Cardiomegaly post CABG and valve replacement   BRIEF PATIENT DESCRIPTION:  85 yoF with likely baseline dementia with mechanical fall at home and increased confusion/ visual hallucinations for several days in with right hip fx.  Pt has been refusing all care, husband unable to guide therapy, no HPOA, son of country.  Pending Psych eval for competency.  Patient has been hypoxic with mod right pleural effusion and with mild hypotension this am.    ASSESSMENT / PLAN: Acute Respiratory failure The patient presented inpACU with hypercqrbic resp failure. Placed on BIPAP. Iniitally was not doing well and had poor TVs. I gave the patient  2 amps pof Narcan .04 mg without substantioal improvement. The oatient was placed on BIPAP. We explained to the family that she proabbly required intiubatiuon but they wanrted to spek with her son ( who is away) before embarking on that path. ABG pending.  The patient will be admitted to the ICU. The patient wwas sen preop by the pulmonary service. The effusion was juudged not urgent to tap at that time.  Cardio The patient has signifanct diastolic dysfn. Has bialterl effusions likely due to CHF. Has signifiant pulmonary hTN which may be due to pore-existing severe mitral regurgitation. May have had minor ischemia inn thepast few days.  Neuro The patient is very altered . It may be a consequence of hypercarbia and introp ssedation. Howver, there is a chance she could be septic or that she might have become septic. Hopefully, the poicture will clarfy over the next several hours.  The patient has aloing hsitory of dementia  Hemodynamics BPwas iniially ok. It trailed off on bIPAPO. Likely a consequence of Postive pressure  ventialtion aand her hsitory of signifant pulmonary HTN.  Renal Renal fn. Appears normam  Hip Fracture S/P right hip nailplacemnt . F/U as per ortho.

## 2017-04-25 NOTE — Progress Notes (Signed)
Name: [name] MRN: [id number] DOB: 12-16-1932    ADMISSION DATE:  04/22/2017 CONSULTATION DATE:  04/23/2017  REFERRING MD :  Dr. Janee Morn  CHIEF COMPLAINT:  Hypoxia/ R pleural effusion  Brief HPI  84 year old female with PMH significant for but not limited to former smoker, diastolic HF, CAD s/p MVR and CABG 2008, AAA, HTN, HLD, PVD and questionable dementia who was admitted to [facility] on 4/8 with right intertrochanteric fracture.  She was found to be hypoxic with moderate right sided pleural effusion, without significant WOB or oxygen requirements, found to be in acute on chronic diastolic heart failure.  Diuresis limited due to ongoing borderline blood pressure.  IR was consulted for thoracentesis however unable to perform till after surgery.  Cardiology consulted for surgery clearance.  PCCM following for hypoxic respiratory failure.  She was taken to the OR 4/11 for right intramedullary intertrochanteric nail under spinal block as she was deemed high risk.  Post-operatively she remained lethargic, failing narcan and BiPAP requiring intubation per anesthesia.  Additionally she requires vasopressors post-operatively and labs pending.  Transferred to ICU where PCCM will assume medical care.   SUBJECTIVE:  Weaning Neo, currently at 50 mcg Sedation with PRN fentanyl  VITAL SIGNS: Temp:  [97.2 F (36.2 C)-98.2 F (36.8 C)] 97.2 F (36.2 C) (04/11 2020) Pulse Rate:  [42-123] 111 (04/11 2020) Resp:  [9-23] 20 (04/11 2040) BP: (70-154)/(15-118) 70/36 (04/11 2010) SpO2:  [76 %-100 %] 100 % (04/11 2020) Arterial Line BP: (68-140)/(50-71) 122/64 (04/11 2020) FiO2 (%):  [40 %-70 %] 40 % (04/11 2040) Weight:  [101 lb 3.1 oz (45.9 kg)] 101 lb 3.1 oz (45.9 kg) (04/11 1258)  PHYSICAL EXAMINATION: General:  Critically ill, thin elderly female sedated on MV HEENT: MM pink/moist, pupils 3/reactive Neuro: sedated, does not f/c CV:  rrr, S4 PULM: even/non-labored on MV, lungs bilaterally clear,  diminished in R base GI: soft, non-tender, bs active  Extremities: warm/dry, +2-3 BLE edema  Skin: no rashes, surgical dressings to right hip and thigh x 2 dry   Recent Labs  Lab 04/24/17 0529 04/25/17 0413 04/25/17 1940 04/25/17 2004  NA 142 143 140 142  K 4.9 4.2 5.0 4.8  CL 100* 98* 101  --   CO2 33* 37* 27  --   BUN 20 16 19   --   CREATININE 0.84 0.71 0.78  --   GLUCOSE 83 100* 162*  --    Recent Labs  Lab 04/24/17 1522 04/25/17 0413 04/25/17 1940 04/25/17 2004  HGB 9.9* 9.0* 8.4* 9.9*  HCT 36.3 34.2* 31.8* 29.0*  WBC 7.0 5.6 7.9  --   PLT 149* 144* 172  --    Dg Chest Port 1 View  Result Date: 04/25/2017 CLINICAL DATA:  Shortness of Breath EXAM: PORTABLE CHEST 1 VIEW COMPARISON:  04/23/2017 FINDINGS: Changes of median sternotomy and valve replacement. Moderate to large right effusion is similar or slightly improved since prior study. Diffuse right lung airspace disease. Small to moderate left effusion, new since prior study with left lower lobe atelectasis or infiltrate. IMPRESSION: Moderate right effusion, slightly improved since prior study. New small to moderate left effusion. Bilateral airspace disease which could reflect atelectasis or pneumonia, right greater than left. Electronically Signed   By: Charlett Nose M.D.   On: 04/25/2017 08:59   Dg C-arm 1-60 Min  Result Date: 04/25/2017 CLINICAL DATA:  Right intertrochanteric femur fracture ORIF. EXAM: DG C-ARM 61-120 MIN; RIGHT FEMUR 2 VIEWS COMPARISON:  Right  hip x-rays dated April 22, 2017. FINDINGS: Intraoperative x-rays demonstrate interval placement of a right femoral cephalomedullary rod with distal interlocking screw. Near anatomic alignment of the dominant intertrochanteric fracture fragments. The lesser trochanter remains mildly displaced medially. Right hip osteoarthritis. Vascular calcifications. IMPRESSION: 1. Improved alignment of the right intertrochanteric femur fracture status post ORIF. FLUOROSCOPY TIME:  1  minutes, 37 seconds. C-arm fluoroscopic images were obtained intraoperatively and submitted for post operative interpretation. Electronically Signed   By: Obie Dredge M.D.   On: 04/25/2017 15:46   Dg Femur, Min 2 Views Right  Result Date: 04/25/2017 CLINICAL DATA:  Right intertrochanteric femur fracture ORIF. EXAM: DG C-ARM 61-120 MIN; RIGHT FEMUR 2 VIEWS COMPARISON:  Right hip x-rays dated April 22, 2017. FINDINGS: Intraoperative x-rays demonstrate interval placement of a right femoral cephalomedullary rod with distal interlocking screw. Near anatomic alignment of the dominant intertrochanteric fracture fragments. The lesser trochanter remains mildly displaced medially. Right hip osteoarthritis. Vascular calcifications. IMPRESSION: 1. Improved alignment of the right intertrochanteric femur fracture status post ORIF. FLUOROSCOPY TIME:  1 minutes, 37 seconds. C-arm fluoroscopic images were obtained intraoperatively and submitted for post operative interpretation. Electronically Signed   By: Obie Dredge M.D.   On: 04/25/2017 15:46    SIGNIFICANT EVENTS  4/8 Admit  STUDIES:  CXR 4/8 hip >> Comminuted intratrochanteric fracture of the proximal right femur.  CXR 4/9 >> Progressive enlargement of right-sided pleural effusion now moderate to large in size with associated right lung atelectasis.  Mild atelectasis medial aspect left lung base.  Pulmonary vascular congestion.  Cardiomegaly post CABG and valve replacement  4/9 TTE:  Echo with preserved EF and severe pulmonary hypertension with estimated RV systolic pressure 75 mmHg.Suspect acute on chronic diastolic heart failure.  Severe pulmonary hypertension.The pulmonary HTN is likely related to her chronic MR as per cardiology.  CULTURES: 4/9 UC >> neg 4/9 MRSA PCR >> neg  ANTIBIOTICS: 4/11 ancef x 1  LINES/TUBES: PIV x 3 4/11 L radial Aline >>  4/11 ETT >> 4/09 Foley >>  DISCUSSION: 40 yoF admitted s/p ?mechanical fall w/ R hip Fx  found in acute on chronic diastolic heart failure, moderate R pleural effusion, acute on chronic worsening encephalopathy (?underlying dementia) underwent surgery 4/11 with spinal block.  Postoperatively with worsening lethargy, on BiPAP/ no response to narcan, subsequently intubated by anesthesia.  ASSESSMENT / PLAN:  PULMONARY A: Acute hypoxic/ hypercarbic respiratory failure - in the setting of post-operative meds +/- acute on chronic diastolic HF Pleural Effusions- R moderate  -likely in the setting of acute on chronic diastolic HF in the setting of question compliance with home meds, BNP 3142. PCT negative, no suggestive symptoms of infectious process  P:   Full MV support, PRVC 8cc/kg ABG and CXR now IR already consulted for thora, will ideally perform prior to extubation VAP protocol Daily SBT Duoneb PRN  CARDIOVASCULAR A:  Acute hypotension -? Likely related to sedation Acute on chronic diastolic HF Hx CAD s/p MVR and CABG 2008, AAA, HTN, HLD, PVD - 4/9 TTE- LVEF is higher at 60-65%. RVSP is much higher at 75 mmHg with moderate TR. There is no   significant MV stenosis and mild regurgitation. Ascites is noted. P:  Tele monitoring Neo for MAP > 65 Continue Aline Consider CVL if pressor requirement increase Hold diuresis while on pressors  post op Hgb r/o ABLA -> 8.4 to 9.9 S/p albumin Assess cortisol   RENAL A:   No acute issues P:   Hold diuresis  while on pressors Trend BMP / mag/ phos/ daily wt/ urinary output Replace electrolytes as indicated  GASTROINTESTINAL A:   NPO P:   NPO  PPI for SUP  Start TF in am if not extubated LFTs in am  HEMATOLOGIC A:   Anemia  P:  Trend CBC Transfuse for Hgb < 7 SCD and lovenox for VTE  INFECTIOUS A:   R/o infection - remains afebrile, normal WBC, no obvious infiltrate - CXR  - PCT neg 4/8 P:   Send UA Trend WBC/ fever curve   ENDOCRINE A:   No acute findings P:   CBG q 4 while NPO SSI if consistently  hyperglycemic   NEUROLOGIC A:   Acute on chronic encephalopathy, ?baseline dementia - acute 4/11 in the post-op setting ddx:  sedating medications (although minimal) superimposed sepsis, pneumonia, atelctasis ,CVA( perhaps introperative) P:   RASS goal: -1 Wake up assessment in ICU to eval neuro exam - determine if CTH is needed, then PAD protocol with prn fentanyl, precedex if needed  Frequent neuro exams   FAMILY  - Updates:  Patient's husband and daughter in law updated prior on days by Dr. Franchot Erichsenosenblatt.  No family at bedside in ICU.  - Inter-disciplinary family meet or Palliative Care meeting due by:  05/02/2016  CCT 35 min  Posey BoyerBrooke Simpson, AGACNP-BC [facility] Pgr: [phone] or if no answer [phone] 04/25/2017, 10:02 PM

## 2017-04-25 NOTE — Consult Note (Addendum)
[facility] CM Primary Care Navigator  04/25/2017  [name] 1932-07-08 [id number]    Went to see patient at the bedside to identify possible discharge needs but she is off the unit for surgery per RN report. (INTRAMEDULLARY (IM) NAIL INTERTROCHANTERIC (Right).  Per chart review, patient presented to the ED after a mechanical fall and noted to have a right intertrochanteric hip fracture.  Per Inpatient social worker note, patient's husband has medical conditions that make it harder for him to appropriately support all of patient's needs. Will await recommendations of PT/ OT post surgery.  Per MD note, patient has dementia. Psychiatry consulted and patient does not demonstrate capacity to refuse care for her current medical condition. Discharge disposition is skilled nursing facility (SNF) when medically stable, postoperatively and clinically improved per MD.  Will attempt to see patient or family at another time when available it the room.    Addendum (04/26/17):  Patient was transferred to ICU 4N 30 after right hip surgery yesterday. Per MD note, patient was grossly hypopneic and remains poorly responsive that required intubation.     Addendum: (04/29/17):  Went to seepatient at the bedside to identify possible discharge needs.Met with patient's husband [name]) and son [name]- only child). Sonreportsthat patient "had a fall and fractured her right hip" that had led to this admission/ surgery.  Patient and husband confirms that Dr.Brian [place]renshaw with [place] astheprimary care provider for years.   [facility] [place].  According tohusband, patient has beenmanaginghermedicationsat home with his assistance, straight out of the containers.Son plans to use "pill box" system for patient after discharge.  Patient states that she has beendriving prior admission/ surgery. She  also provides transportation for her husband as well. Son works as a Insurance underwriter for [employer] and he is often working out of the country. Son shared his plan of moving his parents to [place] which is closer to him. Son was made aware of [facility] CM transportation resources for patient if still needed in the near future.   Patient and husband lives on their own at home. Patient is the primary caregiver for herself. Son reports that he had cleared up his schedule to the end of this month to provide assistance for the care of patient (his parents).  Anticipated discharge plan is skilled nursing facility (SNF- in process) as per therapy recommendation .  Patient and son voiced understanding to callprimary care provider's office when shereturnsbackhome to schedulea post discharge follow-upwithin1- 2 weeksor sooner if needed. Patient letter (with PCP's contact number) was provided as areminder.    Explained topatientand son regardingTHN CM services available for health managementat home and seemed quite interested with it. Patient's son expressed understanding to seek referral from primary care provider to [facility] care managementifdeemed necessary and appropriatefor servicesin the future-once patientreturns back home).  [facility] care management information provided for future needs that may arise.   For additional questions please contact:  Edwena Felty A. Mazy Culton, BSN, RN-BC [facility] PRIMARY CARE Navigator Cell: [phone]

## 2017-04-25 NOTE — Interval H&P Note (Signed)
History and Physical Interval Note:  04/25/2017 1:04 PM  Regan RakersAnn B Hursey  has presented today for surgery, with the diagnosis of [facility]  The various methods of treatment have been discussed with the patient and family. After consideration of risks, benefits and other options for treatment, the patient has consented to  Procedure(s): INTRAMEDULLARY (IM) NAIL INTERTROCHANTRIC (Right) as a surgical intervention .  The patient's history has been reviewed, patient examined, no change in status, stable for surgery.  I have reviewed the patient's chart and labs.  Questions were answered to the patient's satisfaction.     Reema Chick D

## 2017-04-25 NOTE — OR Nursing (Signed)
Pt out of surgery, continues to be unresponsive. Pt started to desat on simple mask in 80s, NRB applied. Anesthesia aware and at bedside. Pt O2sat increased back to 98% NRB. New orders for Lasix and Albuterol. Dr. Marland McalpineSheikh informed of situation by Dr. Maple HudsonMoser. New orders for stepdown placed.

## 2017-04-25 NOTE — Progress Notes (Addendum)
Progress Note  Patient Name: [name] Date of Encounter: 04/25/2017  Primary Cardiologist: Olga Millers, MD   Subjective   Pt opens her eyes and responds to questions this am. No SOB  Inpatient Medications    Scheduled Meds: . aspirin EC  81 mg Oral Daily  . atorvastatin  40 mg Oral q1800  . chlorhexidine  60 mL Topical Once  . enoxaparin (LOVENOX) injection  30 mg Subcutaneous Q24H  . furosemide  40 mg Intravenous BID  . metoprolol succinate  100 mg Oral Daily  .  morphine injection  2 mg Intravenous Once  . ondansetron (ZOFRAN) IV  4 mg Intravenous Once  . potassium chloride SA  20 mEq Oral Daily  . povidone-iodine  2 application Topical Once   Continuous Infusions: . methocarbamol (ROBAXIN)  IV     PRN Meds: iopamidol, methocarbamol **OR** methocarbamol (ROBAXIN)  IV, morphine injection, oxyCODONE   Vital Signs    Vitals:   04/24/17 2030 04/25/17 0000 04/25/17 0120 04/25/17 0602  BP: (!) 81/52 (!) 82/64 93/71 94/67   Pulse: (!) 105 (!) 105 90 (!) 101  Resp: 18   19  Temp: 98.4 F (36.9 C)   98.2 F (36.8 C)  TempSrc: Oral   Oral  SpO2: 100%   100%  Weight:      Height:        Intake/Output Summary (Last 24 hours) at 04/25/2017 4696 Last data filed at 04/25/2017 0120 Gross per 24 hour  Intake 60 ml  Output 2500 ml  Net -2440 ml   Filed Weights   04/22/17 1546  Weight: 101 lb 3.1 oz (45.9 kg)    Telemetry      ECG    04/24/17- NSR, PVC, LAD, LVH - Personally Reviewed  Physical Exam   GEN: Cachectic Caucasian female No acute distress.   Neck: No JVD Cardiac: RRR 1/6 systolic murmur LSB Respiratory: decreased breath sounds Rt > Lt, no rales GI: Soft, nontender, non-distended  MS: trace LE edema, No deformity. Neuro:  Nonfocal  Psych: Normal affect   Labs    Chemistry Recent Labs  Lab 04/23/17 0925 04/23/17 1453 04/24/17 0529 04/25/17 0413  NA  --  140 142 143  K  --  5.0 4.9 4.2  CL  --  103 100* 98*  CO2  --  25 33* 37*    GLUCOSE  --  100* 83 100*  BUN  --  24* 20 16  CREATININE  --  0.87 0.84 0.71  CALCIUM  --  8.6* 8.7* 8.5*  PROT 5.0*  --   --  5.1*  ALBUMIN 2.5*  --   --  2.4*  AST 54*  --   --  16  ALT 17  --   --  12*  ALKPHOS 35*  --   --  33*  BILITOT 1.9*  --   --  1.2  GFRNONAA  --  >60 >60 >60  GFRAA  --  >60 >60 >60  ANIONGAP  --  12 9 8      Hematology Recent Labs  Lab 04/24/17 0529 04/24/17 1522 04/25/17 0413  WBC 6.5 7.0 5.6  RBC 4.45 4.28 4.01  HGB 9.9* 9.9* 9.0*  HCT 37.7 36.3 34.2*  MCV 84.7 84.8 85.3  MCH 22.2* 23.1* 22.4*  MCHC 26.3* 27.3* 26.3*  RDW 18.2* 18.5* 18.1*  PLT 132* 149* 144*    Cardiac Enzymes Recent Labs  Lab 04/23/17 1453 04/23/17 2032  TROPONINI 0.42*  0.45*   No results for input(s): TROPIPOC in the last 168 hours.   BNP Recent Labs  Lab 04/22/17 2156  BNP 3,142.4*     DDimer No results for input(s): DDIMER in the last 168 hours.   Radiology    Dg Chest Port 1 View  Result Date: 04/23/2017 CLINICAL DATA:  84 year old female with hypoxia. Subsequent encounter. EXAM: PORTABLE CHEST 1 VIEW COMPARISON:  04/22/2017 chest x-ray. FINDINGS: Post CABG and valve replacement.  Cardiomegaly. Progressive enlargement of right-sided pleural effusion now moderate to large in size with associated right lung atelectasis. Pulmonary vascular congestion. Medial left base subsegmental atelectasis. Calcified tortuous aorta.  Prominent vascular calcifications. IMPRESSION: Progressive enlargement of right-sided pleural effusion now moderate to large in size with associated right lung atelectasis. Mild atelectasis medial aspect left lung base. Pulmonary vascular congestion. Cardiomegaly post CABG and valve replacement. Aortic Atherosclerosis (ICD10-I70.0). Electronically Signed   By: Lacy Duverney M.D.   On: 04/23/2017 08:45    Cardiac Studies   Echo 04/23/17- Study Conclusions  - Left ventricle: The cavity size was normal. Wall thickness was   normal. Systolic  function was normal. The estimated ejection   fraction was in the range of 60% to 65%. Images were inadequate   for LV wall motion assessment. - Aortic valve: There was trivial regurgitation. - Mitral valve: s/p annuloplasty repair. No significant stenosis.   Mild regurgitation. Valve area by continuity equation (using LVOT   flow): 1.68 cm^2. - Left atrium: The atrium was normal in size. - Right ventricle: The cavity size was mildly dilated. Moderately   reduced RV systolic function. - Right atrium: The atrium was normal in size. - Tricuspid valve: There was moderate regurgitation. - Pulmonary arteries: PA peak pressure: 75 mm Hg (S). - Inferior vena cava: The vessel was dilated. The respirophasic   diameter changes were blunted (< 50%), consistent with elevated   central venous pressure. - Pericardium, extracardiac: Ascites is noted.  Impressions:  - Compared to a study in 2014, the LVEF is higher at 60-65%. The   RVSP is much higher at 75 mmHg with moderate TR. There is no   significant MV stenosis and mild regurgitation. Ascites is noted.  Patient Profile     84 y.o. female with medical history significant for history of CAD with prior CABG x 2 and mitral valve repair with LAA ligation 2008, hypertension, dyslipidemia, RCE 2010, moderate iliac disease, AAA 2.8 cm in 2015, and chronic diastolic heart failure with associated pulmonary hypertension, admitted 04/22/17 with fractured Rt hip. On admission pt had respiratory distress and confusion. Her BNP was 3k and she had Rt pleural effusion on CXR, cardiology consulted 4/9.   Assessment & Plan    Acute on chronic diastolic CHF- she has diuresed 4.3L on Iv lasix. Normal LVF and pulmonary HTN on echo.   Hip fracture- she needs surgery- this has apparently been postponed until Cardiology feels she is stable enough  Dementia- pt has been deemed not competent to make medical decisions on her behalf.   H/O CABG and MV repair  2008  Know PVD  Pulmonary HTN  Rt pleural effusion- ? For thoracentesis this am  Plan: Will review with MD- she has diuresed. Hypotensive overnight requiring IVF- will stop Lasix and decrease Toprol. CXR pending.   For questions or updates, please contact [facility] Please consult [email or web address] for contact info under Cardiology/STEMI.      Signed, Corine Shelter, PA-C  04/25/2017, 8:21 AM  Attending Note:   The patient was seen and examined.  Agree with assessment and plan as noted above.  Changes made to the above note as needed.  Patient seen and independently examined with Corine ShelterLuke Kilroy, PA .   We discussed all aspects of the encounter. I agree with the assessment and plan as stated above.  1.   Pre-op evaluation :  She has acute / chronic diastolic CHF .   The patient appears to be adequately diuresed.   In fact, may be a bit over diuresed.    She is ok for surgery .   She is at low- moderate risk She may need some IVF if her BP is soft.    I have spent a total of 40 minutes with patient reviewing hospital  notes , telemetry, EKGs, labs and examining patient as well as establishing an assessment and plan that was discussed with the patient. > 50% of time was spent in direct patient care.    Vesta MixerPhilip J. Nahser, Montez HagemanJr., MD, [facility] Medical CenterFACC 04/25/2017, 9:20 AM [address],  [address] Office [phone] Pager [phone]

## 2017-04-25 NOTE — Progress Notes (Signed)
Patient ID: [name], female   DOB: 10/28/1932, 84 y.o.   MRN: [id number]  Spoke with Dr. Marland McalpineSheikh who had hoped to have the thoracentesis done prior to surgery but as pulmonary and cardiology have already cleared her he didn't think surgery needed to be delayed to have procedure done.    Freeman CaldronMichael J. [name], PA-C Orthopedic Surgery [phone]

## 2017-04-26 ENCOUNTER — Inpatient Hospital Stay (HOSPITAL_COMMUNITY): Payer: Medicare Other

## 2017-04-26 ENCOUNTER — Encounter (HOSPITAL_COMMUNITY): Payer: Self-pay | Admitting: Orthopedic Surgery

## 2017-04-26 DIAGNOSIS — I480 Paroxysmal atrial fibrillation: Secondary | ICD-10-CM

## 2017-04-26 DIAGNOSIS — Z951 Presence of aortocoronary bypass graft: Secondary | ICD-10-CM

## 2017-04-26 DIAGNOSIS — F0281 Dementia in other diseases classified elsewhere with behavioral disturbance: Secondary | ICD-10-CM

## 2017-04-26 LAB — POCT I-STAT 3, ART BLOOD GAS (G3+)
ACID-BASE EXCESS: 16 mmol/L — AB (ref 0.0–2.0)
Acid-Base Excess: 9 mmol/L — ABNORMAL HIGH (ref 0.0–2.0)
Bicarbonate: 38.7 mmol/L — ABNORMAL HIGH (ref 20.0–28.0)
Bicarbonate: 34.3 mmol/L — ABNORMAL HIGH (ref 20.0–28.0)
O2 SAT: 99 %
O2 SAT: 98 %
PCO2 ART: 36.9 mmHg (ref 32.0–48.0)
PCO2 ART: 47.8 mmHg (ref 32.0–48.0)
PH ART: 7.629 — AB (ref 7.350–7.450)
Patient temperature: 98.6
Patient temperature: 98.6
TCO2: 40 mmol/L — AB (ref 22–32)
TCO2: 36 mmol/L — ABNORMAL HIGH (ref 22–32)
pH, Arterial: 7.464 — ABNORMAL HIGH (ref 7.350–7.450)
pO2, Arterial: 94 mmHg (ref 83.0–108.0)
pO2, Arterial: 100 mmHg (ref 83.0–108.0)

## 2017-04-26 LAB — CBC WITH DIFFERENTIAL/PLATELET
BASOS ABS: 0 10*3/uL (ref 0.0–0.1)
BASOS PCT: 0 %
EOS ABS: 0 10*3/uL (ref 0.0–0.7)
EOS PCT: 0 %
HCT: 27.2 % — ABNORMAL LOW (ref 36.0–46.0)
Hemoglobin: 7.5 g/dL — ABNORMAL LOW (ref 12.0–15.0)
LYMPHS PCT: 10 %
Lymphs Abs: 0.5 10*3/uL — ABNORMAL LOW (ref 0.7–4.0)
MCH: 22.9 pg — ABNORMAL LOW (ref 26.0–34.0)
MCHC: 27.6 g/dL — ABNORMAL LOW (ref 30.0–36.0)
MCV: 83.2 fL (ref 78.0–100.0)
Monocytes Absolute: 0.4 10*3/uL (ref 0.1–1.0)
Monocytes Relative: 8 %
Neutro Abs: 3.8 10*3/uL (ref 1.7–7.7)
Neutrophils Relative %: 82 %
PLATELETS: 101 10*3/uL — AB (ref 150–400)
RBC: 3.27 MIL/uL — AB (ref 3.87–5.11)
RDW: 18.1 % — ABNORMAL HIGH (ref 11.5–15.5)
WBC: 4.6 10*3/uL (ref 4.0–10.5)

## 2017-04-26 LAB — URINALYSIS, ROUTINE W REFLEX MICROSCOPIC
BILIRUBIN URINE: NEGATIVE
Glucose, UA: NEGATIVE mg/dL
Ketones, ur: 5 mg/dL — AB
Leukocytes, UA: NEGATIVE
Nitrite: NEGATIVE
PH: 5 (ref 5.0–8.0)
PROTEIN: NEGATIVE mg/dL
SQUAMOUS EPITHELIAL / LPF: NONE SEEN
Specific Gravity, Urine: 1.01 (ref 1.005–1.030)

## 2017-04-26 LAB — COMPREHENSIVE METABOLIC PANEL
ALBUMIN: 2.5 g/dL — AB (ref 3.5–5.0)
ALK PHOS: 27 U/L — AB (ref 38–126)
ALT: 25 U/L (ref 14–54)
ANION GAP: 8 (ref 5–15)
AST: 41 U/L (ref 15–41)
BUN: 21 mg/dL — ABNORMAL HIGH (ref 6–20)
CALCIUM: 8.7 mg/dL — AB (ref 8.9–10.3)
CHLORIDE: 102 mmol/L (ref 101–111)
CO2: 33 mmol/L — AB (ref 22–32)
Creatinine, Ser: 0.8 mg/dL (ref 0.44–1.00)
GFR calc Af Amer: >60 mL/min (ref >60)
GFR calc non Af Amer: >60 mL/min (ref >60)
GLUCOSE: 135 mg/dL — AB (ref 65–99)
Potassium: 3.7 mmol/L (ref 3.5–5.1)
SODIUM: 143 mmol/L (ref 135–145)
Total Bilirubin: 1.2 mg/dL (ref 0.3–1.2)
Total Protein: 4.9 g/dL — ABNORMAL LOW (ref 6.5–8.1)

## 2017-04-26 LAB — BODY FLUID CELL COUNT WITH DIFFERENTIAL
EOS FL: 0 %
Lymphs, Fluid: 28 %
MONOCYTE-MACROPHAGE-SEROUS FLUID: 60 % (ref 50–90)
Neutrophil Count, Fluid: 12 % (ref 0–25)
Other Cells, Fluid: 0 %
Total Nucleated Cell Count, Fluid: 105 cu mm (ref 0–1000)

## 2017-04-26 LAB — GLUCOSE, PLEURAL OR PERITONEAL FLUID: Glucose, Fluid: 120 mg/dL

## 2017-04-26 LAB — PREPARE RBC (CROSSMATCH)

## 2017-04-26 LAB — GLUCOSE, CAPILLARY
GLUCOSE-CAPILLARY: 139 mg/dL — AB (ref 65–99)
GLUCOSE-CAPILLARY: 103 mg/dL — AB (ref 65–99)
GLUCOSE-CAPILLARY: 101 mg/dL — AB (ref 65–99)
GLUCOSE-CAPILLARY: 105 mg/dL — AB (ref 65–99)
GLUCOSE-CAPILLARY: 131 mg/dL — AB (ref 65–99)

## 2017-04-26 LAB — GRAM STAIN

## 2017-04-26 LAB — TROPONIN I
TROPONIN I: 1.53 ng/mL — AB (ref <0.03)
Troponin I: 1.27 ng/mL (ref <0.03)
Troponin I: 1.06 ng/mL (ref <0.03)

## 2017-04-26 LAB — HEMOGLOBIN AND HEMATOCRIT, BLOOD
HCT: 32.6 % — ABNORMAL LOW (ref 36.0–46.0)
Hemoglobin: 9.4 g/dL — ABNORMAL LOW (ref 12.0–15.0)

## 2017-04-26 LAB — LACTATE DEHYDROGENASE, PLEURAL OR PERITONEAL FLUID: LD, Fluid: 67 U/L — ABNORMAL HIGH (ref 3–23)

## 2017-04-26 LAB — PROTEIN, PLEURAL OR PERITONEAL FLUID

## 2017-04-26 LAB — MAGNESIUM: Magnesium: 1.6 mg/dL — ABNORMAL LOW (ref 1.7–2.4)

## 2017-04-26 LAB — ALBUMIN, PLEURAL OR PERITONEAL FLUID: Albumin, Fluid: 1.5 g/dL

## 2017-04-26 LAB — PHOSPHORUS: PHOSPHORUS: 1.6 mg/dL — AB (ref 2.5–4.6)

## 2017-04-26 LAB — CORTISOL: CORTISOL PLASMA: 87.4 ug/dL

## 2017-04-26 MED ORDER — LIDOCAINE HCL (PF) 1 % IJ SOLN
INTRAMUSCULAR | Status: AC
  Filled 2017-04-26: qty 30

## 2017-04-26 MED ORDER — INSULIN ASPART 100 UNIT/ML ~~LOC~~ SOLN
1.0000 [IU] | SUBCUTANEOUS | Status: DC
  Administered 2017-04-26: 1 [IU] via SUBCUTANEOUS
  Administered 2017-04-26: 2 [IU] via SUBCUTANEOUS
  Administered 2017-04-26 – 2017-04-27 (×5): 1 [IU] via SUBCUTANEOUS

## 2017-04-26 MED ORDER — LACTATED RINGERS IV SOLN
INTRAVENOUS | Status: DC
  Administered 2017-04-26 – 2017-04-27 (×2): via INTRAVENOUS

## 2017-04-26 MED ORDER — ASPIRIN 81 MG PO CHEW
81.0000 mg | CHEWABLE_TABLET | Freq: Every day | ORAL | Status: DC
  Administered 2017-04-26 – 2017-05-06 (×11): 81 mg via ORAL
  Filled 2017-04-26 (×11): qty 1

## 2017-04-26 MED ORDER — SODIUM CHLORIDE 0.9 % IV SOLN
Freq: Once | INTRAVENOUS | Status: AC
  Administered 2017-04-26: 10:00:00 via INTRAVENOUS

## 2017-04-26 MED ORDER — VITAL AF 1.2 CAL PO LIQD
1000.0000 mL | ORAL | Status: DC
  Administered 2017-04-26: 1000 mL

## 2017-04-26 MED ORDER — DILTIAZEM HCL 100 MG IV SOLR
5.0000 mg/h | INTRAVENOUS | Status: DC
  Filled 2017-04-26: qty 100

## 2017-04-26 MED ORDER — METOPROLOL TARTRATE 25 MG PO TABS
25.0000 mg | ORAL_TABLET | Freq: Two times a day (BID) | ORAL | Status: DC
  Administered 2017-04-26 – 2017-04-28 (×3): 25 mg
  Filled 2017-04-26 (×4): qty 1

## 2017-04-26 MED ORDER — DILTIAZEM LOAD VIA INFUSION
10.0000 mg | Freq: Once | INTRAVENOUS | Status: DC
  Filled 2017-04-26: qty 10

## 2017-04-26 NOTE — Progress Notes (Signed)
Notified Dr Franchot Erichsenosenblatt about patients urine output was only 200cc for the day. Dr Franchot Erichsenosenblatt ordered to increased patients lactated ringers fluid in 75cc/hr. Patient has foley catheter in place and will remain in place. Patient has no complaints and is not having any discomfort. Will continue to monitor.

## 2017-04-26 NOTE — Progress Notes (Signed)
Initial Nutrition Assessment  DOCUMENTATION CODES:   Non-severe (moderate) malnutrition in context of chronic illness  INTERVENTION:   If unable to extubate within 48 hours, recommend:  -Initiate TF via OGT with Vital AF 1.2 at goal rate of 40 ml/h (960 ml per day) to provide 1152 kcals, 72 gm protein, 779 ml free water daily.  NUTRITION DIAGNOSIS:   Moderate Malnutrition related to chronic illness(dementia) as evidenced by energy intake < 75% for > or equal to 1 month, mild fat depletion, moderate fat depletion, mild muscle depletion, moderate muscle depletion.  GOAL:   Patient will meet greater than or equal to 90% of their needs  MONITOR:   Vent status, Labs, Weight trends, Skin, I & O's  REASON FOR ASSESSMENT:   Ventilator    ASSESSMENT:   84 year old female with PMH significant for but not limited to former smoker, diastolic HF, CAD s/p MVR and CABG 2008, AAA, HTN, HLD, PVD and questionable dementia who was admitted to [facility] on 4/8 with right intertrochanteric fracture.  4/11- intubated, s/p rt intramedullary nail intertochanteric   Patient is currently intubated on ventilator support. OGT in place. MV: 7.1 L/min Temp (24hrs), Avg:98.3 F (36.8 C), Min:97.2 F (36.2 C), Max:99.3 F (37.4 C)  Case discussed with RN prior to visit. No plans for extubation today. No orders to start feeding currently, however, RN plans to ask MD about TF when rounding later today. RN confirms no contraindications for TF.  Pt alert and able to answer some questions with head nods. Most hx obtained from pt husband and family friend at bedside. Per pt husband, pt typically consumes 4 very small meals daily. She eats very little at baseline, however, pt husband reports that pt's intake has been declining over the past few weeks. He suspects pt has lost weight, but unsure of UBW or when wt loss started occurring. Per husband and family friend, pt has always been petite.   Reviewed wt hx; noted  that pt has had ongoing wt loss over the past several years, however, no recent wt loss available to assess acute wt changes.   Pt husband denies any changes in mobility, however, shared that he suspects pt fell in the kitchen when attempting to heat up her coffee in the microwave PTA.   Explained to pt husband how pt may receive nutrition if pt continues to be intubated.   Labs reviewed: CBGS: 103 (inpatient orders for glycemc control are 1-3 units insulin aspart every 4 hours), Phos: 1.6, Mg: 1.6. K WDL.   NUTRITION - FOCUSED PHYSICAL EXAM:    Most Recent Value  Orbital Region  Moderate depletion  Upper Arm Region  Moderate depletion  Thoracic and Lumbar Region  Mild depletion  Buccal Region  Mild depletion  Temple Region  Mild depletion  Clavicle Bone Region  Moderate depletion  Clavicle and Acromion Bone Region  Mild depletion  Scapular Bone Region  Mild depletion  Dorsal Hand  Mild depletion  Patellar Region  Moderate depletion  Anterior Thigh Region  Unable to assess  Posterior Calf Region  Moderate depletion  Edema (RD Assessment)  Mild  Hair  Reviewed  Eyes  Reviewed  Mouth  Reviewed  Skin  Reviewed  Nails  Reviewed       Diet Order:  Diet NPO time specified  EDUCATION NEEDS:   Education needs have been addressed  Skin:  Skin Assessment: Skin Integrity Issues: Skin Integrity Issues:: Incisions, DTI, Stage II DTI: sacrum Stage II: sacrum  Incisions: rt knee  Last BM:  04/22/17  Height:   Ht Readings from Last 1 Encounters:  04/25/17 5\' 2"  (1.575 m)    Weight:   Wt Readings from Last 1 Encounters:  04/26/17 102 lb 4.7 oz (46.4 kg)    Ideal Body Weight:  50 kg  BMI:  Body mass index is 18.71 kg/m.  Estimated Nutritional Needs:   Kcal:  1104  Protein:  60-75 grams  Fluid:  > 1.1 L    Hanna Ra A. Mayford KnifeWilliams, RD, LDN, CDE Pager: [phone] After hours Pager: [phone]

## 2017-04-26 NOTE — Progress Notes (Signed)
Progress Note  Patient Name: [name] Date of Encounter: 04/26/2017  Primary Cardiologist: Kirk Ruths, MD   Subjective   84 year old female with a history of coronary artery disease, coronary artery bypass grafting, mitral valve repair, chronic diastolic congestive heart failure with associated pulmonary hypertension.  She was admitted on April 8 with a fractured hip.  She went for hip repair yesterday.  Longer surgery she had respiratory distress.  She was intubated and is now on a ventilator.  Sedated.  Unable to communicate this morning.   He has significant dementia at baseline.  Has a right pleural effusion.  Inpatient Medications    Scheduled Meds: . aspirin EC  81 mg Oral Daily  . atorvastatin  40 mg Oral q1800  . chlorhexidine gluconate (MEDLINE KIT)  15 mL Mouth Rinse BID  . enoxaparin (LOVENOX) injection  30 mg Subcutaneous Q24H  . insulin aspart  1-3 Units Subcutaneous Q4H  . mouth rinse  15 mL Mouth Rinse 10 times per day  . metoprolol succinate  50 mg Oral Daily  . ondansetron (ZOFRAN) IV  4 mg Intravenous Once  . pantoprazole (PROTONIX) IV  40 mg Intravenous Daily  . potassium chloride SA  20 mEq Oral Daily   Continuous Infusions: . sodium chloride    . dexmedetomidine (PRECEDEX) IV infusion Stopped (04/25/17 2100)  . lactated ringers 10 mL/hr at 04/26/17 1000  . methocarbamol (ROBAXIN)  IV    . phenylephrine (NEO-SYNEPHRINE) Adult infusion Stopped (04/26/17 0500)   PRN Meds: acetaminophen, bisacodyl, docusate, fentaNYL (SUBLIMAZE) injection, fentaNYL (SUBLIMAZE) injection, iopamidol, methocarbamol **OR** methocarbamol (ROBAXIN)  IV, polyethylene glycol   Vital Signs    Vitals:   04/26/17 0715 04/26/17 0730 04/26/17 0800 04/26/17 1036  BP: 137/69 (!) 151/85 101/89   Pulse: (!) 106 (!) 109 94   Resp: (!) 21 (!) _0 Temp:   98.6 F (37 C) 98.2 F (36.8 C)  TempSrc:   Axillary Axillary  SpO2: 100% 100% 100% 100%  Weight:      Height:         Intake/Output Summary (Last 24 hours) at 04/26/2017 1047 Last data filed at 04/26/2017 1000 Gross per 24 hour  Intake 1296.46 ml  Output 585 ml  Net 711.46 ml   Filed Weights   04/22/17 1546 04/25/17 1258 04/26/17 0420  Weight: 101 lb 3.1 oz (45.9 kg) 101 lb 3.1 oz (45.9 kg) 102 lb 4.7 oz (46.4 kg)    Telemetry    Atrial fib  with rapid ventricular response- Personally Reviewed  ECG    Re-ordered - Personally Reviewed  Physical Exam   GEN:  Elderly female.  She is on the ventilator.  Sedated. Neck: No JVD Cardiac:  Irregularly irregular.  Heart rate is slightly fast. Respiratory: Clear to auscultation bilaterally. GI: Soft, nontender, non-distended  MS:  Bandage on right hip Neuro:  Nonfocal  Psych: Normal affect   Labs    Chemistry Recent Labs  Lab 04/23/17 0925  04/25/17 0413 04/25/17 1940 04/25/17 2004 04/26/17 0430  NA  --    < > 143 140 142 143  K  --    < > 4.2 5.0 4.8 3.7  CL  --    < > 98* 101  --  102  CO2  --    < > 37* 27  --  33*  GLUCOSE  --    < > 100* 162*  --  135*  BUN  --    < >  16 19  --  21*  CREATININE  --    < > 0.71 0.78  --  0.80  CALCIUM  --    < > 8.5* 8.5*  --  8.7*  PROT 5.0*  --  5.1*  --   --  4.9*  ALBUMIN 2.5*  --  2.4*  --   --  2.5*  AST 54*  --  16  --   --  41  ALT 17  --  12*  --   --  25  ALKPHOS 35*  --  33*  --   --  27*  BILITOT 1.9*  --  1.2  --   --  1.2  GFRNONAA  --    < > >60 >60  --  >60  GFRAA  --    < > >60 >60  --  >60  ANIONGAP  --    < > 8 12  --  8   < > = values in this interval not displayed.     Hematology Recent Labs  Lab 04/25/17 0413 04/25/17 1940 04/25/17 2004 04/26/17 0530  WBC 5.6 7.9  --  4.6  RBC 4.01 3.71*  --  3.27*  HGB 9.0* 8.4* 9.9* 7.5*  HCT 34.2* 31.8* 29.0* 27.2*  MCV 85.3 85.7  --  83.2  MCH 22.4* 22.6*  --  22.9*  MCHC 26.3* 26.4*  --  27.6*  RDW 18.1* 18.2*  --  18.1*  PLT 144* 172  --  101*    Cardiac Enzymes Recent Labs  Lab 04/23/17 1453 04/23/17 2032    TROPONINI 0.42* 0.45*   No results for input(s): TROPIPOC in the last 168 hours.   BNP Recent Labs  Lab 04/22/17 2156  BNP 3,142.4*     DDimer No results for input(s): DDIMER in the last 168 hours.   Radiology    Portable Chest Xray  Result Date: 04/26/2017 CLINICAL DATA:  Endotracheal tube EXAM: PORTABLE CHEST 1 VIEW COMPARISON:  04/25/2017 FINDINGS: Cardiac shadow is stable. Postsurgical changes are again seen. Endotracheal tube and nasogastric catheter are noted in satisfactory position. Bilateral pleural effusions are again seen right greater than left. Underlying atelectasis is present. IMPRESSION: Stable bilateral pleural effusions and atelectasis. Electronically Signed   By: Inez Catalina M.D.   On: 04/26/2017 07:55   Dg Chest Port 1 View  Result Date: 04/25/2017 CLINICAL DATA:  Endotracheal tube present.  OG tube placement. EXAM: PORTABLE CHEST 1 VIEW COMPARISON:  Radiograph earlier this day at 0617 hour FINDINGS: Endotracheal tube 2.2 cm from the carina. Enteric tube in place with tip and side-port below the diaphragm in the stomach. Post median sternotomy. Unchanged cardiomegaly and mediastinal contours. Aortic atherosclerosis. Unchanged right pleural effusion and basilar airspace disease. Improved left pleural effusion. Improving bilateral airspace disease. No pneumothorax. The bones are under mineralized. IMPRESSION: 1. Endotracheal tube tip 2.2 cm from the carina. Tip and side port of the enteric tube below the diaphragm. 2. Bilateral pleural effusions, moderate on the right and small on the left, with improvement from exam earlier this day. 3. Improving aeration with improvement in bilateral airspace disease. Electronically Signed   By: Jeb Levering M.D.   On: 04/25/2017 21:47   Dg Chest Port 1 View  Result Date: 04/25/2017 CLINICAL DATA:  Shortness of Breath EXAM: PORTABLE CHEST 1 VIEW COMPARISON:  04/23/2017 FINDINGS: Changes of median sternotomy and valve replacement.  Moderate to large right effusion is similar or slightly improved since prior  study. Diffuse right lung airspace disease. Small to moderate left effusion, new since prior study with left lower lobe atelectasis or infiltrate. IMPRESSION: Moderate right effusion, slightly improved since prior study. New small to moderate left effusion. Bilateral airspace disease which could reflect atelectasis or pneumonia, right greater than left. Electronically Signed   By: Rolm Baptise M.D.   On: 04/25/2017 08:59   Dg C-arm 1-60 Min  Result Date: 04/25/2017 CLINICAL DATA:  Right intertrochanteric femur fracture ORIF. EXAM: DG C-ARM 61-120 MIN; RIGHT FEMUR 2 VIEWS COMPARISON:  Right hip x-rays dated April 22, 2017. FINDINGS: Intraoperative x-rays demonstrate interval placement of a right femoral cephalomedullary rod with distal interlocking screw. Near anatomic alignment of the dominant intertrochanteric fracture fragments. The lesser trochanter remains mildly displaced medially. Right hip osteoarthritis. Vascular calcifications. IMPRESSION: 1. Improved alignment of the right intertrochanteric femur fracture status post ORIF. FLUOROSCOPY TIME:  1 minutes, 37 seconds. C-arm fluoroscopic images were obtained intraoperatively and submitted for post operative interpretation. Electronically Signed   By: Titus Dubin M.D.   On: 04/25/2017 15:46   Dg Femur, Min 2 Views Right  Result Date: 04/25/2017 CLINICAL DATA:  Right intertrochanteric femur fracture ORIF. EXAM: DG C-ARM 61-120 MIN; RIGHT FEMUR 2 VIEWS COMPARISON:  Right hip x-rays dated April 22, 2017. FINDINGS: Intraoperative x-rays demonstrate interval placement of a right femoral cephalomedullary rod with distal interlocking screw. Near anatomic alignment of the dominant intertrochanteric fracture fragments. The lesser trochanter remains mildly displaced medially. Right hip osteoarthritis. Vascular calcifications. IMPRESSION: 1. Improved alignment of the right  intertrochanteric femur fracture status post ORIF. FLUOROSCOPY TIME:  1 minutes, 37 seconds. C-arm fluoroscopic images were obtained intraoperatively and submitted for post operative interpretation. Electronically Signed   By: Titus Dubin M.D.   On: 04/25/2017 15:46    Cardiac Studies     Patient Profile     84 y.o. female with coronary artery disease, chronic diastolic congestive heart failure.  She had urgent hip surgery yesterday.  She has developed respiratory failure following her surgery.  She has developed   rapid atrial for ablation.  Assessment & Plan    1.  Respiratory distress: Plans per [facility] M. We will check troponin levels.  I have reordered EKG this morning.  Continue supportive care.  2.  Atrial fibrillation.  This may be related to the stress of surgery. Will start IV cardizem drip   3.  Pulmonary HYpertension:   Severe,   Likely contributing to her respiratory failure   For questions or updates, please contact [place] Please consult [email or web address] for contact info under Cardiology/STEMI.      Signed, Mertie Moores, MD  04/26/2017, 10:47 AM

## 2017-04-26 NOTE — Progress Notes (Signed)
    Subjective: Patient remains intubated.  Minimally interactive but does respond to simple commands.  Objective:   VITALS:   Vitals:   04/26/17 0645 04/26/17 0700 04/26/17 0715 04/26/17 0730  BP: 124/69 136/67 137/69 (!) 151/85  Pulse: (!) 108 (!) 102 (!) 106 (!) 109  Resp: 20 18 (!) 21 (!) 22  Temp:      TempSrc:      SpO2: 100% 100% 100% 100%  Weight:      Height:       CBC Latest Ref Rng & Units 04/26/2017 04/25/2017 04/25/2017  WBC 4.0 - 10.5 K/uL 4.6 - 7.9  Hemoglobin 12.0 - 15.0 g/dL 7.5(L) 9.9(L) 8.4(L)  Hematocrit 36.0 - 46.0 % 27.2(L) 29.0(L) 31.8(L)  Platelets 150 - 400 K/uL 101(L) - 172   BMP Latest Ref Rng & Units 04/26/2017 04/25/2017 04/25/2017  Glucose 65 - 99 mg/dL 161(W135(H) - 960(A162(H)  BUN 6 - 20 mg/dL 54(U21(H) - 19  Creatinine 0.44 - 1.00 mg/dL 9.810.80 - 1.910.78  Sodium 478135 - 145 mmol/L 143 142 140  Potassium 3.5 - 5.1 mmol/L 3.7 4.8 5.0  Chloride 101 - 111 mmol/L 102 - 101  CO2 22 - 32 mmol/L 33(H) - 27  Calcium 8.9 - 10.3 mg/dL 2.9(F8.7(L) - 8.5(L)   Intake/Output      04/11 0701 - 04/12 0700 04/12 0701 - 04/13 0700   P.O.     I.V. (mL/kg) 1066.5 (23)    IV Piggyback 200    Total Intake(mL/kg) 1266.5 (27.3)    Urine (mL/kg/hr) 510 (0.5)    Blood 75    Total Output 585    Net +681.5            Physical Exam: General: NAD.  Intubated.  Opens eyes to command. MSK RLE: Feet warm.  Positive DP pulse. Dorsiflexion/Plantar flexion intact Incision: dressing C/D/I   Assessment: 1 Day Post-Op  S/P Procedure(s) (LRB): INTRAMEDULLARY (IM) NAIL INTERTROCHANTRIC (Right) by Dr. Jewel Baizeimothy D. Eulah PontMurphy on 04/25/2017  Principal Problem:   Closed displaced intertrochanteric fracture of right femur (HCC) Active Problems:   H/O mitral valve repair   Hypertension, essential, benign   Acute on chronic diastolic (congestive) heart failure (HCC)   Dyslipidemia, goal LDL below 70   PVD (peripheral vascular disease) (HCC)   Dementia   AAA (abdominal aortic aneurysm) (HCC)  Acute respiratory failure with hypoxia (HCC)   CKD (chronic kidney disease), stage [name] (HCC)   Carotid artery stenosis   Pulmonary hypertension (HCC)   Former tobacco use   Hx of CABG   Pleural effusion, right   Preoperative respiratory examination   Evaluation by psychiatric service required   Hypoxia   Closed right intertrochanteric femur fracture, status post antegrade IM nail Minimally interactive since surgery and subsequent intubation in the PACU.  Plan: Continue care per CCM Mobilize with therapy when able. Apply ice as needed.  Patient does not need abduction pillow from an orthopedic perspective  Weightbearing: WBAT RLE Insicional and dressing care: Reinforce dressings as needed.  Mepilex. Orthopedic device(s): None.   Showering: Keep dressing dry VTE prophylaxis: Lovenox.  SCDs, ambulation Follow - up plan:  We will continue to follow during the immediate postoperative phase.  Normally plan to follow-up outpatient in about 2 weeks. Contact information:  Margarita Ranaimothy Murphy MD, Aquilla HackerHenry Martensen PA-C Dispo: Per CCM.   Melissa KernHenry Calvin Martensen III, PA-C 04/26/2017, 7:57 AM

## 2017-04-26 NOTE — Progress Notes (Signed)
SLP Cancellation Note  Patient Details Name: Regan Rakersnn B Ragsdale MRN: [id number] DOB: 11/20/1932   Cancelled treatment:       Reason Eval/Treat Not Completed: Medical issues which prohibited therapy. Pt intubated. Will sign off at this time.    Bedie Dominey, Riley NearingBonnie Caroline 04/26/2017, 9:49 AM

## 2017-04-26 NOTE — Progress Notes (Signed)
CRITICAL VALUE ALERT  Critical Value:  Troponin 1.53  Date & Time Notied:  04/26/2017 & 1330  Provider Notified: Dr. Elease HashimotoNahser

## 2017-04-26 NOTE — Progress Notes (Signed)
OT Cancellation Note  Patient Details Name: [name] MRN: [id number] DOB: 02/21/1932   Cancelled Treatment:    Reason Eval/Treat Not Completed: Patient not medically ready  [name], [name]  [phone] 04/26/2017, 7:51 AM

## 2017-04-26 NOTE — Procedures (Signed)
PROCEDURE SUMMARY:  Successful US guided right thoracentesis. Yielded 1 L of clear yellow fluid. Pt tolerated procedure well. No immediate complications.  Specimen was sent for labs. CXR ordered.  Brayton ElBRUNING, Saharah Sherrow PA-C 04/26/2017 3:27 PM

## 2017-04-26 NOTE — Progress Notes (Signed)
PT Cancellation Note  Patient Details Name: Regan Rakersnn B Bundrick MRN: [id number] DOB: 02/22/1932   Cancelled Treatment:    Reason Eval/Treat Not Completed: Medical issues which prohibited therapy(pt remains intubated post op)   Darrius Montano B Bridget Westbrooks 04/26/2017, 7:19 AM  Delaney MeigsMaija Tabor Layken Beg, PT [phone]

## 2017-04-26 NOTE — Progress Notes (Signed)
Name: [name] MRN: [id number] DOB: 1932/08/20    ADMISSION DATE:  04/22/2017 CONSULTATION DATE:  04/23/2017  REFERRING MD :  Dr. Janee Morn  CHIEF COMPLAINT:  Hypoxia/ R pleural effusion  HISTORY OF PRESENT ILLNESS:   HPI obtained from medical chart review as patient is confused and not able to given accurate history.  84 year old female with PMH significant for but not limited to former smoker, diastolic HF, CAD s/p MVR and CABG 2008, AAA, HTN, HLD, PVD and questionable dementia who was admitted to [facility] on 4/8 with right intertrochanteric fracture.  On exam, patient is oriented to herself and place however clearly confused, unable to explain why she is hospitalized, denies being married, and is pleasant and inappropriate in her responses.  No family at bedside.    Patient is from home, lives with her husband.  Apparently had mechanical fall yesterday with resultant right hip pain found to have right intertrochanteric fracture.  Family reports several days of confusion, visual hallucinations involving her cat, and worsening edema to lower extremities.  Questionable if patient is compliant with home medications including diuretics.  Additionally, patient was hypoxic initially requiring O2 found to have large right pleural effusion, saturating 100% on 2L.  Labs noted for BNP 3142, negative PCT, Tmax 99.3, normal WBC.  Patient has been refusing most care, including surgery, and her competency in making decisions is pending psych evaluation.  There is no established HPOA.   Additionally husband has been having difficulty in guiding decision making and their son is out of the country.     4/11 I was asked to ress the patient by the hospitalist He was concerned that the patient may need a thoracentesis. The patient just had her right hip surgery. She is pale and poorly responsive. She had 25 mcg of Fentanyl durng the intervention. She is grossly hypopneic and remains poorly responsive. She got 25  mcg opf propofol as well. We are going to check her Hb as she appears pale. I gave her 2 amps of .04 narcan as well. Her BP is ok. Her heart rate is about 115 and Bp is about 125/70.  PAST MEDICAL HISTORY :   has a past medical history of AAA (abdominal aortic aneurysm) (HCC), Carotid artery stenosis, Chronic diastolic heart failure (HCC), Coronary atherosclerosis of native coronary artery, Dementia, Hypercholesteremia, Hypertension, essential, benign, PVD (peripheral vascular disease) (HCC), and Rhinitis, allergic.  has a past surgical history that includes Mitral valve repair (05/22/2006); Coronary artery bypass graft (05/22/2006); Carotid endarterectomy (03/2008); and Colonoscopy (1996). Prior to Admission medications   Medication Sig Start Date End Date Taking? Authorizing Provider  amLODipine (NORVASC) 5 MG tablet TAKE 1 TABLET (5 MG TOTAL) BY MOUTH DAILY. 02/13/16  Yes Lewayne Bunting, MD  aspirin 81 MG tablet Take 81 mg by mouth daily.     Yes [provider]  atorvastatin (LIPITOR) 40 MG tablet Take 1 tablet (40 mg total) by mouth daily at 6 PM. NEED OV. 01/07/17  Yes Crenshaw, [name], MD  enalapril (VASOTEC) 20 MG tablet TAKE 1 TABLET (20 MG TOTAL) BY MOUTH 2 (TWO) TIMES DAILY. NEED OV. Patient taking differently: Take 20 mg by mouth daily.  04/01/17  Yes Lewayne Bunting, MD  furosemide (LASIX) 40 MG tablet TAKE 1 TABLET BY MOUTH EVERY DAY Patient taking differently: 40mg  by mouth daily as needed for fluid 03/23/16  Yes Crenshaw, [name], MD  KLOR-CON M20 20 MEQ tablet TAKE 1 TABLET EVERY DAY -  MUST MAKE APPOINTMENT FOR FURTHER REFILLS 07/10/16  Yes Lewayne Buntingrenshaw, Brian S, MD  metoprolol succinate (TOPROL-XL) 100 MG 24 hr tablet TAKE 1 TABLET (100 MG TOTAL) BY MOUTH DAILY. NEED OV. 04/01/17  Yes Crenshaw, Madolyn FriezeBrian S, MD   No Known Allergies  FAMILY HISTORY:  family history includes Heart attack in her mother. SOCIAL HISTORY:  reports that she quit smoking about 11 years ago. Her  smoking use included cigarettes. She has never used smokeless tobacco. She reports that she drank alcohol. She reports that she does not use drugs.  REVIEW OF SYSTEMS:   Unable to obtain as patient is unable to provide based on acute, questionably chronic confusion.  SUBJECTIVE:  Patient without complaints.  Denies SOB or pain.  VITAL SIGNS: Temp:  [97.2 F (36.2 C)-98.8 F (37.1 C)] 98.2 F (36.8 C) (04/12 0400) Pulse Rate:  [42-123] 109 (04/12 0730) Resp:  [9-25] 22 (04/12 0730) BP: (70-165)/(15-133) 151/85 (04/12 0730) SpO2:  [76 %-100 %] 100 % (04/12 0730) Arterial Line BP: (68-172)/(50-71) 157/70 (04/12 0730) FiO2 (%):  [40 %-70 %] 40 % (04/12 0903) Weight:  [101 lb 3.1 oz (45.9 kg)-102 lb 4.7 oz (46.4 kg)] 102 lb 4.7 oz (46.4 kg) (04/12 0420)  PHYSICAL EXAMINATION: General:  Frail, thin, elderly female lying in bed in NAD HEENT: MM pink/dry, pupils 3/ reactive  Neuro: Patient significantly obtunded, non-responsive CV: RRR, +S4 PULM: using accessory resp. Mm, poor resp effort,   GI: soft, non-tender, bs active  Extremities: warm/dry, BLE 3+ edema, Right leg shortened and internally rotated Skin: no rashes  Recent Labs  Lab 04/25/17 0413 04/25/17 1940 04/25/17 2004 04/26/17 0430  NA 143 140 142 143  K 4.2 5.0 4.8 3.7  CL 98* 101  --  102  CO2 37* 27  --  33*  BUN 16 19  --  21*  CREATININE 0.71 0.78  --  0.80  GLUCOSE 100* 162*  --  135*   Recent Labs  Lab 04/25/17 0413 04/25/17 1940 04/25/17 2004 04/26/17 0530  HGB 9.0* 8.4* 9.9* 7.5*  HCT 34.2* 31.8* 29.0* 27.2*  WBC 5.6 7.9  --  4.6  PLT 144* 172  --  101*   Portable Chest Xray  Result Date: 04/26/2017 CLINICAL DATA:  Endotracheal tube EXAM: PORTABLE CHEST 1 VIEW COMPARISON:  04/25/2017 FINDINGS: Cardiac shadow is stable. Postsurgical changes are again seen. Endotracheal tube and nasogastric catheter are noted in satisfactory position. Bilateral pleural effusions are again seen right greater than left.  Underlying atelectasis is present. IMPRESSION: Stable bilateral pleural effusions and atelectasis. Electronically Signed   By: Alcide CleverMark  Lukens M.D.   On: 04/26/2017 07:55   Dg Chest Port 1 View  Result Date: 04/25/2017 CLINICAL DATA:  Endotracheal tube present.  OG tube placement. EXAM: PORTABLE CHEST 1 VIEW COMPARISON:  Radiograph earlier this day at 0617 hour FINDINGS: Endotracheal tube 2.2 cm from the carina. Enteric tube in place with tip and side-port below the diaphragm in the stomach. Post median sternotomy. Unchanged cardiomegaly and mediastinal contours. Aortic atherosclerosis. Unchanged right pleural effusion and basilar airspace disease. Improved left pleural effusion. Improving bilateral airspace disease. No pneumothorax. The bones are under mineralized. IMPRESSION: 1. Endotracheal tube tip 2.2 cm from the carina. Tip and side port of the enteric tube below the diaphragm. 2. Bilateral pleural effusions, moderate on the right and small on the left, with improvement from exam earlier this day. 3. Improving aeration with improvement in bilateral airspace disease. Electronically Signed   By: Shawna OrleansMelanie  Ehinger M.D.   On: 04/25/2017 21:47   Dg Chest Port 1 View  Result Date: 04/25/2017 CLINICAL DATA:  Shortness of Breath EXAM: PORTABLE CHEST 1 VIEW COMPARISON:  04/23/2017 FINDINGS: Changes of median sternotomy and valve replacement. Moderate to large right effusion is similar or slightly improved since prior study. Diffuse right lung airspace disease. Small to moderate left effusion, new since prior study with left lower lobe atelectasis or infiltrate. IMPRESSION: Moderate right effusion, slightly improved since prior study. New small to moderate left effusion. Bilateral airspace disease which could reflect atelectasis or pneumonia, right greater than left. Electronically Signed   By: Charlett Nose M.D.   On: 04/25/2017 08:59   Dg C-arm 1-60 Min  Result Date: 04/25/2017 CLINICAL DATA:  Right  intertrochanteric femur fracture ORIF. EXAM: DG C-ARM 61-120 MIN; RIGHT FEMUR 2 VIEWS COMPARISON:  Right hip x-rays dated April 22, 2017. FINDINGS: Intraoperative x-rays demonstrate interval placement of a right femoral cephalomedullary rod with distal interlocking screw. Near anatomic alignment of the dominant intertrochanteric fracture fragments. The lesser trochanter remains mildly displaced medially. Right hip osteoarthritis. Vascular calcifications. IMPRESSION: 1. Improved alignment of the right intertrochanteric femur fracture status post ORIF. FLUOROSCOPY TIME:  1 minutes, 37 seconds. C-arm fluoroscopic images were obtained intraoperatively and submitted for post operative interpretation. Electronically Signed   By: Obie Dredge M.D.   On: 04/25/2017 15:46   Dg Femur, Min 2 Views Right  Result Date: 04/25/2017 CLINICAL DATA:  Right intertrochanteric femur fracture ORIF. EXAM: DG C-ARM 61-120 MIN; RIGHT FEMUR 2 VIEWS COMPARISON:  Right hip x-rays dated April 22, 2017. FINDINGS: Intraoperative x-rays demonstrate interval placement of a right femoral cephalomedullary rod with distal interlocking screw. Near anatomic alignment of the dominant intertrochanteric fracture fragments. The lesser trochanter remains mildly displaced medially. Right hip osteoarthritis. Vascular calcifications. IMPRESSION: 1. Improved alignment of the right intertrochanteric femur fracture status post ORIF. FLUOROSCOPY TIME:  1 minutes, 37 seconds. C-arm fluoroscopic images were obtained intraoperatively and submitted for post operative interpretation. Electronically Signed   By: Obie Dredge M.D.   On: 04/25/2017 15:46    SIGNIFICANT EVENTS  4/8 Admit  STUDIES:  CXR 4/8 hip >> Comminuted intratrochanteric fracture of the proximal right femur.  CXR 4/9 >> Progressive enlargement of right-sided pleural effusion now moderate to large in size with associated right lung atelectasis.  Mild atelectasis medial aspect left lung  base.  Pulmonary vascular congestion.  Cardiomegaly post CABG and valve replacement  ECHO:Echo with preserved EF and severe pulmonary hypertension with estimated RV systolic pressure 75 mmHg.Suspect acute on chronic diastolic heart failure.  Severe pulmonary hypertension.The pulmonary HTN is likely related to her chronic MR as per cardiology.   BRIEF PATIENT DESCRIPTION:  ASSESSMENT / PLAN: Patient with dementia. Newly s/p right hip surgery. She is hypopneic and doesn't look well. We are in the midst of deciding where she will go post-op.    Acute  respiratory failure  likely in the setting of acute on chronic diastolic HF in the setting of question compliance with home meds, BNP 3142. PCT negative, no suggestive symptoms of infectious process  currently she is maintaining on minimal O2 requirements with no significant increase in WOB. I have just placed the patient on BIPAP until when and if she arouses to support her breathing. It appears more and more that the patient may require reintubation at this point. The patient is intubated as per family. ABG is pending. CXR shows bilateral effusions R>L    Pleural effusions The  patient appears to have diastolic heart failure and bilateral effusions.  It is probably due to CHF. There may be some compression atelectasis as well.  We are going to try to get the right effusion tapped today  Altered mental status We are assuming at this point that the patient developed acute hypercarbic resp failure in the setting of her surgery perhaps secondary  To the sedating medications ( although minimal as they were). There is a possibility of superimposed sepsis, pneumonia, atelctasis ,CVA( perhaps introperative). May need some additional time to ascertain what has transpired here. The patient has been performing some purposeful movements this AM. Did some simple commands earlier this AM   BP Her bP is normal presently Her neosymnnpehrine started in the  pACU has been discontinued                Recent Labs  Lab 04/25/17 0413 04/25/17 1940 04/25/17 2004 04/26/17 0530  HGB 9.0* 8.4* 9.9* 7.5*  HCT 34.2* 31.8* 29.0* 27.2*  WBC 5.6 7.9  --  4.6  PLT 144* 172  --  101*   Portable Chest Xray  Result Date: 04/26/2017 CLINICAL DATA:  Endotracheal tube EXAM: PORTABLE CHEST 1 VIEW COMPARISON:  04/25/2017 FINDINGS: Cardiac shadow is stable. Postsurgical changes are again seen. Endotracheal tube and nasogastric catheter are noted in satisfactory position. Bilateral pleural effusions are again seen right greater than left. Underlying atelectasis is present. IMPRESSION: Stable bilateral pleural effusions and atelectasis. Electronically Signed   By: Alcide Clever M.D.   On: 04/26/2017 07:55   Dg Chest Port 1 View  Result Date: 04/25/2017 CLINICAL DATA:  Endotracheal tube present.  OG tube placement. EXAM: PORTABLE CHEST 1 VIEW COMPARISON:  Radiograph earlier this day at 0617 hour FINDINGS: Endotracheal tube 2.2 cm from the carina. Enteric tube in place with tip and side-port below the diaphragm in the stomach. Post median sternotomy. Unchanged cardiomegaly and mediastinal contours. Aortic atherosclerosis. Unchanged right pleural effusion and basilar airspace disease. Improved left pleural effusion. Improving bilateral airspace disease. No pneumothorax. The bones are under mineralized. IMPRESSION: 1. Endotracheal tube tip 2.2 cm from the carina. Tip and side port of the enteric tube below the diaphragm. 2. Bilateral pleural effusions, moderate on the right and small on the left, with improvement from exam earlier this day. 3. Improving aeration with improvement in bilateral airspace disease. Electronically Signed   By: Rubye Oaks M.D.   On: 04/25/2017 21:47   Dg Chest Port 1 View  Result Date: 04/25/2017 CLINICAL DATA:  Shortness of Breath EXAM: PORTABLE CHEST 1 VIEW COMPARISON:  04/23/2017 FINDINGS: Changes of median sternotomy and valve  replacement. Moderate to large right effusion is similar or slightly improved since prior study. Diffuse right lung airspace disease. Small to moderate left effusion, new since prior study with left lower lobe atelectasis or infiltrate. IMPRESSION: Moderate right effusion, slightly improved since prior study. New small to moderate left effusion. Bilateral airspace disease which could reflect atelectasis or pneumonia, right greater than left. Electronically Signed   By: Charlett Nose M.D.   On: 04/25/2017 08:59   Dg C-arm 1-60 Min  Result Date: 04/25/2017 CLINICAL DATA:  Right intertrochanteric femur fracture ORIF. EXAM: DG C-ARM 61-120 MIN; RIGHT FEMUR 2 VIEWS COMPARISON:  Right hip x-rays dated April 22, 2017. FINDINGS: Intraoperative x-rays demonstrate interval placement of a right femoral cephalomedullary rod with distal interlocking screw. Near anatomic alignment of the dominant intertrochanteric fracture fragments. The lesser trochanter remains mildly displaced medially. Right hip osteoarthritis.  Vascular calcifications. IMPRESSION: 1. Improved alignment of the right intertrochanteric femur fracture status post ORIF. FLUOROSCOPY TIME:  1 minutes, 37 seconds. C-arm fluoroscopic images were obtained intraoperatively and submitted for post operative interpretation. Electronically Signed   By: Obie Dredge M.D.   On: 04/25/2017 15:46   Dg Femur, Min 2 Views Right  Result Date: 04/25/2017 CLINICAL DATA:  Right intertrochanteric femur fracture ORIF. EXAM: DG C-ARM 61-120 MIN; RIGHT FEMUR 2 VIEWS COMPARISON:  Right hip x-rays dated April 22, 2017. FINDINGS: Intraoperative x-rays demonstrate interval placement of a right femoral cephalomedullary rod with distal interlocking screw. Near anatomic alignment of the dominant intertrochanteric fracture fragments. The lesser trochanter remains mildly displaced medially. Right hip osteoarthritis. Vascular calcifications. IMPRESSION: 1. Improved alignment of the right  intertrochanteric femur fracture status post ORIF. FLUOROSCOPY TIME:  1 minutes, 37 seconds. C-arm fluoroscopic images were obtained intraoperatively and submitted for post operative interpretation. Electronically Signed   By: Obie Dredge M.D.   On: 04/25/2017 15:46    SIGNIFICANT EVENTS  4/8 Admit  STUDIES:  CXR 4/8 hip >> Comminuted intratrochanteric fracture of the proximal right femur.  CXR 4/9 >> Progressive enlargement of right-sided pleural effusion now moderate to large in size with associated right lung atelectasis.  Mild atelectasis medial aspect left lung base.  Pulmonary vascular congestion.  Cardiomegaly post CABG and valve replacement  ECHO:Echo with preserved EF and severe pulmonary hypertension with estimated RV systolic pressure 75 mmHg.Suspect acute on chronic diastolic heart failure.  Severe pulmonary hypertension.The pulmonary HTN is likely related to her chronic MR as per cardiology.   BRIEF PATIENT DESCRIPTION:  ASSESSMENT / PLAN: Patient with dementia. Newly s/p right hip surgery. She is hypopneic and doesn't look well. We are in the midst of deciding where she will go post-op.   Acute  respiratory failure  likely in the setting of acute on chronic diastolic HF in the setting of question compliance with home meds, BNP 3142. PCT negative, no suggestive symptoms of infectious process  currently she is maintaining on minimal O2 requirements with no significant increase in WOB. I have just placed the patient on BIPAP until when and if she arouses to support her breathing. It appears more and more that the patient may require reintubation at this point.    Pleural effusions The patient appears to have diastolic heart failure and bilateral effusions.  It is probably due to CHF. There may be some compression atelectasis as well. May need a thoracentesis at some point but cannot pursue at this time.  Altered mental status We are assuming at this point that the  patient developed acute hypercarbic resp failure in the setting of her surgery perhaps secondary  To the sedating medications ( although minimal as they were). There is a possibility of superimposed sepsis, pneumonia, atelctasis ,CVA( perhaps introperative). May need some additional time to ascertain what has transpired here,   BP The patient is running a liitle more hypotensive since being placed on BIPAP.  I am attributing it to her significant pulmonary HTN and the positive pressure ventilation. May have to be a little cautious with diuresis at this point. The patient may need pressors for the time being.  I have discussed the situation with the patient's husband and daughter-in-law.

## 2017-04-26 NOTE — Progress Notes (Signed)
eLink Physician-Brief Progress Note Patient Name: [name] DOB: 08/12/1932 MRN: [id number]   Date of Service  04/26/2017  HPI/Events of Note  RN asking for clarification on insulin order  eICU Interventions  icu phase 1 hypreglucemia order sent     Intervention Category Major Interventions: Other:  Kalman ShanMurali Chitara Clonch 04/26/2017, 12:36 AM

## 2017-04-27 ENCOUNTER — Encounter (HOSPITAL_COMMUNITY): Payer: Self-pay

## 2017-04-27 DIAGNOSIS — J9601 Acute respiratory failure with hypoxia: Secondary | ICD-10-CM

## 2017-04-27 DIAGNOSIS — N183 Chronic kidney disease, stage 3 (moderate): Secondary | ICD-10-CM

## 2017-04-27 DIAGNOSIS — L8990 Pressure ulcer of unspecified site, unspecified stage: Secondary | ICD-10-CM

## 2017-04-27 DIAGNOSIS — Z978 Presence of other specified devices: Secondary | ICD-10-CM

## 2017-04-27 DIAGNOSIS — Z9889 Other specified postprocedural states: Secondary | ICD-10-CM

## 2017-04-27 DIAGNOSIS — E785 Hyperlipidemia, unspecified: Secondary | ICD-10-CM

## 2017-04-27 DIAGNOSIS — E440 Moderate protein-calorie malnutrition: Secondary | ICD-10-CM

## 2017-04-27 LAB — BASIC METABOLIC PANEL
ANION GAP: 7 (ref 5–15)
BUN: 30 mg/dL — AB (ref 6–20)
CO2: 31 mmol/L (ref 22–32)
Calcium: 8.3 mg/dL — ABNORMAL LOW (ref 8.9–10.3)
Chloride: 103 mmol/L (ref 101–111)
Creatinine, Ser: 0.67 mg/dL (ref 0.44–1.00)
GFR calc Af Amer: >60 mL/min (ref >60)
Glucose, Bld: 149 mg/dL — ABNORMAL HIGH (ref 65–99)
POTASSIUM: 3.6 mmol/L (ref 3.5–5.1)
SODIUM: 141 mmol/L (ref 135–145)

## 2017-04-27 LAB — TYPE AND SCREEN
ABO/RH(D): A NEG
ANTIBODY SCREEN: NEGATIVE
Unit division: 0

## 2017-04-27 LAB — BLOOD GAS, ARTERIAL
ACID-BASE EXCESS: 11.9 mmol/L — AB (ref 0.0–2.0)
Bicarbonate: 35.7 mmol/L — ABNORMAL HIGH (ref 20.0–28.0)
DRAWN BY: 51133
FIO2: 40
MECHVT: 400 mL
O2 SAT: 99.4 %
PATIENT TEMPERATURE: 98.6
PCO2 ART: 43.6 mmHg (ref 32.0–48.0)
PEEP/CPAP: 5 cmH2O
PH ART: 7.524 — AB (ref 7.350–7.450)
RATE: 14 resp/min
pO2, Arterial: 133 mmHg — ABNORMAL HIGH (ref 83.0–108.0)

## 2017-04-27 LAB — CBC WITH DIFFERENTIAL/PLATELET
BASOS ABS: 0 10*3/uL (ref 0.0–0.1)
Basophils Relative: 0 %
Eosinophils Absolute: 0 10*3/uL (ref 0.0–0.7)
Eosinophils Relative: 0 %
HEMATOCRIT: 29.7 % — AB (ref 36.0–46.0)
HEMOGLOBIN: 8.6 g/dL — AB (ref 12.0–15.0)
LYMPHS PCT: 9 %
Lymphs Abs: 0.6 10*3/uL — ABNORMAL LOW (ref 0.7–4.0)
MCH: 23.7 pg — AB (ref 26.0–34.0)
MCHC: 29 g/dL — ABNORMAL LOW (ref 30.0–36.0)
MCV: 81.8 fL (ref 78.0–100.0)
Monocytes Absolute: 0.5 10*3/uL (ref 0.1–1.0)
Monocytes Relative: 7 %
NEUTROS ABS: 5.7 10*3/uL (ref 1.7–7.7)
NEUTROS PCT: 84 %
PLATELETS: 110 10*3/uL — AB (ref 150–400)
RBC: 3.63 MIL/uL — AB (ref 3.87–5.11)
RDW: 17.9 % — ABNORMAL HIGH (ref 11.5–15.5)
WBC: 6.8 10*3/uL (ref 4.0–10.5)

## 2017-04-27 LAB — GLUCOSE, CAPILLARY
GLUCOSE-CAPILLARY: 146 mg/dL — AB (ref 65–99)
GLUCOSE-CAPILLARY: 80 mg/dL (ref 65–99)
Glucose-Capillary: 113 mg/dL — ABNORMAL HIGH (ref 65–99)
Glucose-Capillary: 136 mg/dL — ABNORMAL HIGH (ref 65–99)
Glucose-Capillary: 140 mg/dL — ABNORMAL HIGH (ref 65–99)
Glucose-Capillary: 142 mg/dL — ABNORMAL HIGH (ref 65–99)

## 2017-04-27 LAB — BPAM RBC
Blood Product Expiration Date: 201904172359
ISSUE DATE / TIME: 201904121028
UNIT TYPE AND RH: 600

## 2017-04-27 MED ORDER — DEXMEDETOMIDINE HCL IN NACL 200 MCG/50ML IV SOLN
0.4000 ug/kg/h | INTRAVENOUS | Status: DC
  Administered 2017-04-27: 0.4 ug/kg/h via INTRAVENOUS
  Filled 2017-04-27: qty 50

## 2017-04-27 MED ORDER — ORAL CARE MOUTH RINSE
15.0000 mL | Freq: Two times a day (BID) | OROMUCOSAL | Status: DC
  Administered 2017-04-27 – 2017-05-05 (×15): 15 mL via OROMUCOSAL

## 2017-04-27 MED ORDER — ORAL CARE MOUTH RINSE: 15.0000 mL | Freq: Four times a day (QID) | OROMUCOSAL | Status: DC

## 2017-04-27 MED ORDER — POTASSIUM CHLORIDE 20 MEQ PO PACK
20.0000 meq | PACK | Freq: Every day | ORAL | Status: DC
  Administered 2017-04-27 – 2017-05-06 (×10): 20 meq via ORAL
  Filled 2017-04-27 (×10): qty 1

## 2017-04-27 NOTE — Evaluation (Signed)
Physical Therapy Evaluation Patient Details Name: [name] MRN: [id number] DOB: 12/01/1932 Today's Date: 04/27/2017   History of Present Illness  84 y.o. female admitted on 04/22/17 for fall with R femur fx.  Pt s/p IM Nail and is WBAT post op.  Pt with complications of pleural effusion s/p thorcentesis 04/26/17, was still ventilated with ETT at time of PT eval (04/27/17), and A-fib  Clinical Impression  Limited eval due to very soft BPs (70s/40s lowest during our bed level assessment) with MAPs in 50-60s.  Pt able to follow some basic commands with increased processing time ~50% of the time.  She tolerated LE ROM bil well with some moderate grimacing.  We are hopeful that BP will stabilize and we will be able to progress therapy soon.   PT to follow acutely for deficits listed below.       Follow Up Recommendations SNF    Equipment Recommendations  Rolling walker with 5" wheels;3in1 (PT)    Recommendations for Other Services   NA    Precautions / Restrictions Precautions Precautions: Fall Restrictions Weight Bearing Restrictions: Yes RLE Weight Bearing: Weight bearing as tolerated(was NWB pre op, ortho notes post op say WBAT)      Mobility  Bed Mobility               General bed mobility comments: Did not attempt EOB, total assist to scoot to [facility]. BPs too soft today to safely sit.                Pertinent Vitals/Pain Pain Assessment: Faces Faces Pain Scale: Hurts even more Pain Location: with movement of R leg Pain Descriptors / Indicators: Guarding;Grimacing Pain Intervention(s): Limited activity within patient's tolerance;Monitored during session;Repositioned    Home Living Family/patient expects to be discharged to:: Private residence Living Arrangements: Spouse/significant other Available Help at Discharge: Family;Available 24 hours/day Type of Home: House Home Access: Level entry     Home Layout: One level Home Equipment: Walker - 2 wheels       Prior Function Level of Independence: Independent         Comments: ADLs and light ADLs including cooking and cleaning. Driving.      Hand Dominance   Dominant Hand: Right    Extremity/Trunk Assessment   Upper Extremity Assessment Upper Extremity Assessment: Defer to OT evaluation    Lower Extremity Assessment Lower Extremity Assessment: RLE deficits/detail;LLE deficits/detail RLE Deficits / Details: right leg with normal post op pain and painful guarding with attempted ROM.  Pt tolerated heel ROM, knee ROM and hip flexion/abduction ROM in bed.  heels floated to help with wound prevention.  LLE Deficits / Details: left leg with less resistance, but still more stiffness than I would expect for a normal person her age. Ankle knee and hip ROM met with some mild resistance and grimacing, but improved with repeat repetitions. Pt's heel floated on this side as well.        Communication   Communication: Other (comment)(intubated ETT)  Cognition Arousal/Alertness: Lethargic Behavior During Therapy: Anxious Overall Cognitive Status: Difficult to assess                                           Exercises Total Joint Exercises Ankle Circles/Pumps: PROM;Both;10 reps Heel Slides: PROM;Both;10 reps Hip ABduction/ADduction: PROM;Both;10 reps   Assessment/Plan    PT Assessment Patient needs continued PT services  PT Problem List Decreased strength;Decreased range of motion;Decreased activity tolerance;Decreased balance;Decreased mobility;Decreased knowledge of use of DME;Pain;Cardiopulmonary status limiting activity       PT Treatment Interventions DME instruction;Gait training;Functional mobility training;Therapeutic activities;Therapeutic exercise;Balance training;Patient/family education;Manual techniques;Modalities    PT Goals (Current goals can be found in the Care Plan section)  Acute Rehab PT Goals Patient Stated Goal: husband wants her to get better and  go home. PT Goal Formulation: With family Time For Goal Achievement: 05/11/17 Potential to Achieve Goals: Good    Frequency Min 3X/week   Barriers to discharge Decreased caregiver support Pt's husband uses a cane full time and can likely not physically assist her at discharge.        AM-PAC PT "6 Clicks" Daily Activity  Outcome Measure Difficulty turning over in bed (including adjusting bedclothes, sheets and blankets)?: Unable Difficulty moving from lying on back to sitting on the side of the bed? : Unable Difficulty sitting down on and standing up from a chair with arms (e.g., wheelchair, bedside commode, etc,.)?: Unable Help needed moving to and from a bed to chair (including a wheelchair)?: Total Help needed walking in hospital room?: Total Help needed climbing 3-5 steps with a railing? : Total 6 Click Score: 6    End of Session   Activity Tolerance: Treatment limited secondary to medical complications (Comment)(limited by low BPs 70s/40s) Patient left: in bed;with call bell/phone within reach;with bed alarm set;with nursing/sitter in room   PT Visit Diagnosis: Muscle weakness (generalized) (M62.81);Difficulty in walking, not elsewhere classified (R26.2);Pain Pain - Right/Left: Right Pain - part of body: Leg    Time: 6962-9528 PT Time Calculation (min) (ACUTE ONLY): 30 min   Charges:          Wells Guiles B. Toniesha Zellner, PT, DPT [phone]   PT Evaluation $PT Eval High Complexity: 1 High     04/27/2017, 4:31 PM

## 2017-04-27 NOTE — Progress Notes (Signed)
Name: [name] MRN: [id number] DOB: 1932-08-26    ADMISSION DATE:  04/22/2017 CONSULTATION DATE:  04/23/2017  REFERRING MD :  Dr. Janee Morn  CHIEF COMPLAINT:  Hypoxia/ R pleural effusion  HISTORY OF PRESENT ILLNESS:   HPI obtained from medical chart review as patient is confused and not able to given accurate history.  84 year old female with PMH significant for but not limited to former smoker, diastolic HF, CAD s/p MVR and CABG 2008, AAA, HTN, HLD, PVD and questionable dementia who was admitted to [facility] on 4/8 with right intertrochanteric fracture.  On exam, patient is oriented to herself and place however clearly confused, unable to explain why she is hospitalized, denies being married, and is pleasant and inappropriate in her responses.  No family at bedside.    Patient is from home, lives with her husband.  Apparently had mechanical fall yesterday with resultant right hip pain found to have right intertrochanteric fracture.  Family reports several days of confusion, visual hallucinations involving her cat, and worsening edema to lower extremities.  Questionable if patient is compliant with home medications including diuretics.  Additionally, patient was hypoxic initially requiring O2 found to have large right pleural effusion, saturating 100% on 2L.  Labs noted for BNP 3142, negative PCT, Tmax 99.3, normal WBC.  Patient has been refusing most care, including surgery, and her competency in making decisions is pending psych evaluation.  There is no established HPOA.   Additionally husband has been having difficulty in guiding decision making and their son is out of the country.     4/11 I was asked to ress the patient by the hospitalist He was concerned that the patient may need a thoracentesis. The patient just had her right hip surgery. She is pale and poorly responsive. She had 25 mcg of Fentanyl durng the intervention. She is grossly hypopneic and remains poorly responsive. She got 25  mcg opf propofol as well. We are going to check her Hb as she appears pale. I gave her 2 amps of .04 narcan as well. Her BP is ok. Her heart rate is about 115 and Bp is about 125/70.  4/13 The patient remains on mechanical ventiltion. Has been more awake. Got a bit agitated earlierr so she received a dose of fentanyl. May need to place patient initially on Precedex to allow weaning to transition a little easier. Had thoracentesis yesterday where 1000cc.   Vitals are stable.    PAST MEDICAL HISTORY :   has a past medical history of AAA (abdominal aortic aneurysm) (HCC), Carotid artery stenosis, Chronic diastolic heart failure (HCC), Coronary atherosclerosis of native coronary artery, Dementia, Hypercholesteremia, Hypertension, essential, benign, PVD (peripheral vascular disease) (HCC), and Rhinitis, allergic.  has a past surgical history that includes Mitral valve repair (05/22/2006); Coronary artery bypass graft (05/22/2006); Carotid endarterectomy (03/2008); Colonoscopy (1996); and Intramedullary (im) nail intertrochanteric (Right, 04/25/2017). Prior to Admission medications   Medication Sig Start Date End Date Taking? Authorizing Provider  amLODipine (NORVASC) 5 MG tablet TAKE 1 TABLET (5 MG TOTAL) BY MOUTH DAILY. 02/13/16  Yes Lewayne Bunting, MD  aspirin 81 MG tablet Take 81 mg by mouth daily.     Yes [provider]  atorvastatin (LIPITOR) 40 MG tablet Take 1 tablet (40 mg total) by mouth daily at 6 PM. NEED OV. 01/07/17  Yes Crenshaw, [name], MD  enalapril (VASOTEC) 20 MG tablet TAKE 1 TABLET (20 MG TOTAL) BY MOUTH 2 (TWO) TIMES DAILY. NEED OV. Patient  taking differently: Take 20 mg by mouth daily.  04/01/17  Yes Lewayne Bunting, MD  furosemide (LASIX) 40 MG tablet TAKE 1 TABLET BY MOUTH EVERY DAY Patient taking differently: 40mg  by mouth daily as needed for fluid 03/23/16  Yes Crenshaw, [name], MD  KLOR-CON M20 20 MEQ tablet TAKE 1 TABLET EVERY DAY -MUST MAKE APPOINTMENT FOR  FURTHER REFILLS 07/10/16  Yes Lewayne Bunting, MD  metoprolol succinate (TOPROL-XL) 100 MG 24 hr tablet TAKE 1 TABLET (100 MG TOTAL) BY MOUTH DAILY. NEED OV. 04/01/17  Yes Crenshaw, [name], MD   No Known Allergies  FAMILY HISTORY:  family history includes Heart attack in her mother. SOCIAL HISTORY:  reports that she quit smoking about 11 years ago. Her smoking use included cigarettes. She has never used smokeless tobacco. She reports that she drank alcohol. She reports that she does not use drugs.  REVIEW OF SYSTEMS:   Unable to obtain as patient is unable to provide based on acute, questionably chronic confusion.  SUBJECTIVE:  Patient without complaints.  Denies SOB or pain.  VITAL SIGNS: Temp:  [97.8 F (36.6 C)-99.2 F (37.3 C)] 98.1 F (36.7 C) (04/13 0800) Pulse Rate:  [66-106] 81 (04/13 1135) Resp:  [14-22] 22 (04/13 1135) BP: (100-161)/(53-96) 141/56 (04/13 1135) SpO2:  [100 %] 100 % (04/13 1000) Arterial Line BP: (108-202)/(47-82) 131/54 (04/13 1100) FiO2 (%):  [40 %] 40 % (04/13 1136) Weight:  [110 lb 10.7 oz (50.2 kg)] 110 lb 10.7 oz (50.2 kg) (04/13 0500)  PHYSICAL EXAMINATION: General:  Frail, thin, elderly female lying in bed in NAD HEENT: MM pink/dry, pupils 3/ reactive  Neuro: Patient significantly obtunded, non-responsive CV: RRR, +S4 PULM: using accessory resp. Mm, poor resp effort,   GI: soft, non-tender, bs active  Extremities: warm/dry, BLE 3+ edema, Right leg shortened and internally rotated Skin: no rashes  Recent Labs  Lab 04/25/17 1940 04/25/17 2004 04/26/17 0430 04/27/17 0504  NA 140 142 143 141  K 5.0 4.8 3.7 3.6  CL 101  --  102 103  CO2 27  --  33* 31  BUN 19  --  21* 30*  CREATININE 0.78  --  0.80 0.67  GLUCOSE 162*  --  135* 149*   Recent Labs  Lab 04/25/17 1940  04/26/17 0530 04/26/17 1430 04/27/17 0504  HGB 8.4*   < > 7.5* 9.4* 8.6*  HCT 31.8*   < > 27.2* 32.6* 29.7*  WBC 7.9  --  4.6  --  6.8  PLT 172  --  101*  --  110*     < > = values in this interval not displayed.   Dg Chest Port 1 View  Result Date: 04/26/2017 CLINICAL DATA:  Post thoracentesis EXAM: PORTABLE CHEST 1 VIEW COMPARISON:  04/26/2017, 04/25/2017 FINDINGS: Support devices obscure the upper chest. Endotracheal tube tip is about 2.7 cm superior to the carina. Esophageal tube tip is below the diaphragm but non included. Post sternotomy changes and valve prosthesis. Decreased right greater than left pleural effusions. No definitive right pneumothorax. Improved aeration of the right lower lung. Residual consolidation at the right middle lobe and right base. Mediastinal silhouette is exaggerated by patient rotation. Dense aortic atherosclerosis. IMPRESSION: 1. Decreased small bilateral right greater than left pleural effusions. No definitive right pneumothorax seen 2. Persistent airspace disease at the right middle lobe and right base, atelectasis versus pneumonia. Electronically Signed   By: Jasmine Pang M.D.   On: 04/26/2017 15:45   Portable  Chest Xray  Result Date: 04/26/2017 CLINICAL DATA:  Endotracheal tube EXAM: PORTABLE CHEST 1 VIEW COMPARISON:  04/25/2017 FINDINGS: Cardiac shadow is stable. Postsurgical changes are again seen. Endotracheal tube and nasogastric catheter are noted in satisfactory position. Bilateral pleural effusions are again seen right greater than left. Underlying atelectasis is present. IMPRESSION: Stable bilateral pleural effusions and atelectasis. Electronically Signed   By: Alcide Clever M.D.   On: 04/26/2017 07:55   Dg Chest Port 1 View  Result Date: 04/25/2017 CLINICAL DATA:  Endotracheal tube present.  OG tube placement. EXAM: PORTABLE CHEST 1 VIEW COMPARISON:  Radiograph earlier this day at 0617 hour FINDINGS: Endotracheal tube 2.2 cm from the carina. Enteric tube in place with tip and side-port below the diaphragm in the stomach. Post median sternotomy. Unchanged cardiomegaly and mediastinal contours. Aortic atherosclerosis.  Unchanged right pleural effusion and basilar airspace disease. Improved left pleural effusion. Improving bilateral airspace disease. No pneumothorax. The bones are under mineralized. IMPRESSION: 1. Endotracheal tube tip 2.2 cm from the carina. Tip and side port of the enteric tube below the diaphragm. 2. Bilateral pleural effusions, moderate on the right and small on the left, with improvement from exam earlier this day. 3. Improving aeration with improvement in bilateral airspace disease. Electronically Signed   By: Rubye Oaks M.D.   On: 04/25/2017 21:47   Dg C-arm 1-60 Min  Result Date: 04/25/2017 CLINICAL DATA:  Right intertrochanteric femur fracture ORIF. EXAM: DG C-ARM 61-120 MIN; RIGHT FEMUR 2 VIEWS COMPARISON:  Right hip x-rays dated April 22, 2017. FINDINGS: Intraoperative x-rays demonstrate interval placement of a right femoral cephalomedullary rod with distal interlocking screw. Near anatomic alignment of the dominant intertrochanteric fracture fragments. The lesser trochanter remains mildly displaced medially. Right hip osteoarthritis. Vascular calcifications. IMPRESSION: 1. Improved alignment of the right intertrochanteric femur fracture status post ORIF. FLUOROSCOPY TIME:  1 minutes, 37 seconds. C-arm fluoroscopic images were obtained intraoperatively and submitted for post operative interpretation. Electronically Signed   By: Obie Dredge M.D.   On: 04/25/2017 15:46   Dg Femur, Min 2 Views Right  Result Date: 04/25/2017 CLINICAL DATA:  Right intertrochanteric femur fracture ORIF. EXAM: DG C-ARM 61-120 MIN; RIGHT FEMUR 2 VIEWS COMPARISON:  Right hip x-rays dated April 22, 2017. FINDINGS: Intraoperative x-rays demonstrate interval placement of a right femoral cephalomedullary rod with distal interlocking screw. Near anatomic alignment of the dominant intertrochanteric fracture fragments. The lesser trochanter remains mildly displaced medially. Right hip osteoarthritis. Vascular  calcifications. IMPRESSION: 1. Improved alignment of the right intertrochanteric femur fracture status post ORIF. FLUOROSCOPY TIME:  1 minutes, 37 seconds. C-arm fluoroscopic images were obtained intraoperatively and submitted for post operative interpretation. Electronically Signed   By: Obie Dredge M.D.   On: 04/25/2017 15:46   US Thoracentesis Asp Pleural Space W/img Guide  Result Date: 04/26/2017 INDICATION: Shortness of breath. Respiratory failure. Right-sided pleural effusion. Request diagnostic and therapeutic thoracentesis. EXAM: ULTRASOUND GUIDED RIGHT THORACENTESIS MEDICATIONS: None. COMPLICATIONS: None immediate. PROCEDURE: An ultrasound guided thoracentesis was thoroughly discussed with the patient and questions answered. The benefits, risks, alternatives and complications were also discussed. The patient understands and wishes to proceed with the procedure. Written consent was obtained. Ultrasound was performed to localize and mark an adequate pocket of fluid in the right chest. The area was then prepped and draped in the normal sterile fashion. 1% Lidocaine was used for local anesthesia. Under ultrasound guidance a Safe-T-Centesis catheter was introduced. Thoracentesis was performed. The catheter was removed and a dressing applied. FINDINGS: A  total of approximately 1 L of clear yellow fluid was removed. Samples were sent to the laboratory as requested by the clinical team. IMPRESSION: Successful ultrasound guided right thoracentesis yielding 1 L of pleural fluid. Read by: Brayton El PA-C Electronically Signed   By: Gilmer Mor D.O.   On: 04/26/2017 15:47    SIGNIFICANT EVENTS  4/8 Admit  STUDIES:  CXR 4/8 hip >> Comminuted intratrochanteric fracture of the proximal right femur.  CXR 4/9 >> Progressive enlargement of right-sided pleural effusion now moderate to large in size with associated right lung atelectasis.  Mild atelectasis medial aspect left lung base.  Pulmonary  vascular congestion.  Cardiomegaly post CABG and valve replacement  ECHO:Echo with preserved EF and severe pulmonary hypertension with estimated RV systolic pressure 75 mmHg.Suspect acute on chronic diastolic heart failure.  Severe pulmonary hypertension.The pulmonary HTN is likely related to her chronic MR as per cardiology.   BRIEF PATIENT DESCRIPTION:  ASSESSMENT / PLAN: Patient with dementia. Newly s/p right hip surgery. She is hypopneic and doesn't look well. We are in the midst of deciding where she will go post-op.    Acute  respiratory failure  likely in the setting of acute on chronic diastolic HF in the setting of question compliance with home meds, BNP 3142. PCT negative, no suggestive symptoms of infectious process  currently she is maintaining on minimal O2 requirements with no significant increase in WOB. I have just placed the patient on BIPAP until when and if she arouses to support her breathing. It appears more and more that the patient may require reintubation at this point. The patient is intubated as per family. ABG is pending. CXR shows bilateral effusions R>L    Pleural effusions The patient appears to have diastolic heart failure and bilateral effusions.  It is probably due to CHF. There may be some compression atelectasis as well.  We are going to try to get the right effusion tapped today  Altered mental status We are assuming at this point that the patient developed acute hypercarbic resp failure in the setting of her surgery perhaps secondary  To the sedating medications ( although minimal as they were). There is a possibility of superimposed sepsis, pneumonia, atelctasis ,CVA( perhaps introperative). May need some additional time to ascertain what has transpired here. The patient has been performing some purposeful movements this AM. Did some simple commands earlier this AM   BP Her bP is normal presently Her neosymnnpehrine started in the pACU has been  discontinued                Recent Labs  Lab 04/25/17 1940  04/26/17 0530 04/26/17 1430 04/27/17 0504  HGB 8.4*   < > 7.5* 9.4* 8.6*  HCT 31.8*   < > 27.2* 32.6* 29.7*  WBC 7.9  --  4.6  --  6.8  PLT 172  --  101*  --  110*   < > = values in this interval not displayed.   Dg Chest Port 1 View  Result Date: 04/26/2017 CLINICAL DATA:  Post thoracentesis EXAM: PORTABLE CHEST 1 VIEW COMPARISON:  04/26/2017, 04/25/2017 FINDINGS: Support devices obscure the upper chest. Endotracheal tube tip is about 2.7 cm superior to the carina. Esophageal tube tip is below the diaphragm but non included. Post sternotomy changes and valve prosthesis. Decreased right greater than left pleural effusions. No definitive right pneumothorax. Improved aeration of the right lower lung. Residual consolidation at the right middle lobe and right base. Mediastinal silhouette  is exaggerated by patient rotation. Dense aortic atherosclerosis. IMPRESSION: 1. Decreased small bilateral right greater than left pleural effusions. No definitive right pneumothorax seen 2. Persistent airspace disease at the right middle lobe and right base, atelectasis versus pneumonia. Electronically Signed   By: Jasmine PangKim  Fujinaga M.D.   On: 04/26/2017 15:45   Portable Chest Xray  Result Date: 04/26/2017 CLINICAL DATA:  Endotracheal tube EXAM: PORTABLE CHEST 1 VIEW COMPARISON:  04/25/2017 FINDINGS: Cardiac shadow is stable. Postsurgical changes are again seen. Endotracheal tube and nasogastric catheter are noted in satisfactory position. Bilateral pleural effusions are again seen right greater than left. Underlying atelectasis is present. IMPRESSION: Stable bilateral pleural effusions and atelectasis. Electronically Signed   By: Alcide CleverMark  Lukens M.D.   On: 04/26/2017 07:55   Dg Chest Port 1 View  Result Date: 04/25/2017 CLINICAL DATA:  Endotracheal tube present.  OG tube placement. EXAM: PORTABLE CHEST 1 VIEW COMPARISON:  Radiograph earlier this day at  0617 hour FINDINGS: Endotracheal tube 2.2 cm from the carina. Enteric tube in place with tip and side-port below the diaphragm in the stomach. Post median sternotomy. Unchanged cardiomegaly and mediastinal contours. Aortic atherosclerosis. Unchanged right pleural effusion and basilar airspace disease. Improved left pleural effusion. Improving bilateral airspace disease. No pneumothorax. The bones are under mineralized. IMPRESSION: 1. Endotracheal tube tip 2.2 cm from the carina. Tip and side port of the enteric tube below the diaphragm. 2. Bilateral pleural effusions, moderate on the right and small on the left, with improvement from exam earlier this day. 3. Improving aeration with improvement in bilateral airspace disease. Electronically Signed   By: Rubye OaksMelanie  Ehinger M.D.   On: 04/25/2017 21:47   Dg C-arm 1-60 Min  Result Date: 04/25/2017 CLINICAL DATA:  Right intertrochanteric femur fracture ORIF. EXAM: DG C-ARM 61-120 MIN; RIGHT FEMUR 2 VIEWS COMPARISON:  Right hip x-rays dated April 22, 2017. FINDINGS: Intraoperative x-rays demonstrate interval placement of a right femoral cephalomedullary rod with distal interlocking screw. Near anatomic alignment of the dominant intertrochanteric fracture fragments. The lesser trochanter remains mildly displaced medially. Right hip osteoarthritis. Vascular calcifications. IMPRESSION: 1. Improved alignment of the right intertrochanteric femur fracture status post ORIF. FLUOROSCOPY TIME:  1 minutes, 37 seconds. C-arm fluoroscopic images were obtained intraoperatively and submitted for post operative interpretation. Electronically Signed   By: Obie DredgeWilliam T Derry M.D.   On: 04/25/2017 15:46   Dg Femur, Min 2 Views Right  Result Date: 04/25/2017 CLINICAL DATA:  Right intertrochanteric femur fracture ORIF. EXAM: DG C-ARM 61-120 MIN; RIGHT FEMUR 2 VIEWS COMPARISON:  Right hip x-rays dated April 22, 2017. FINDINGS: Intraoperative x-rays demonstrate interval placement of a right  femoral cephalomedullary rod with distal interlocking screw. Near anatomic alignment of the dominant intertrochanteric fracture fragments. The lesser trochanter remains mildly displaced medially. Right hip osteoarthritis. Vascular calcifications. IMPRESSION: 1. Improved alignment of the right intertrochanteric femur fracture status post ORIF. FLUOROSCOPY TIME:  1 minutes, 37 seconds. C-arm fluoroscopic images were obtained intraoperatively and submitted for post operative interpretation. Electronically Signed   By: Obie DredgeWilliam T Derry M.D.   On: 04/25/2017 15:46   [place] Thoracentesis Asp Pleural Space W/img Guide  Result Date: 04/26/2017 INDICATION: Shortness of breath. Respiratory failure. Right-sided pleural effusion. Request diagnostic and therapeutic thoracentesis. EXAM: ULTRASOUND GUIDED RIGHT THORACENTESIS MEDICATIONS: None. COMPLICATIONS: None immediate. PROCEDURE: An ultrasound guided thoracentesis was thoroughly discussed with the patient and questions answered. The benefits, risks, alternatives and complications were also discussed. The patient understands and wishes to proceed with the procedure. Written consent was obtained.  Ultrasound was performed to localize and mark an adequate pocket of fluid in the right chest. The area was then prepped and draped in the normal sterile fashion. 1% Lidocaine was used for local anesthesia. Under ultrasound guidance a Safe-T-Centesis catheter was introduced. Thoracentesis was performed. The catheter was removed and a dressing applied. FINDINGS: A total of approximately 1 L of clear yellow fluid was removed. Samples were sent to the laboratory as requested by the clinical team. IMPRESSION: Successful ultrasound guided right thoracentesis yielding 1 L of pleural fluid. Read by: Brayton El PA-C Electronically Signed   By: Gilmer Mor D.O.   On: 04/26/2017 15:47    SIGNIFICANT EVENTS  4/8 Admit  STUDIES:  CXR 4/8 hip >> Comminuted intratrochanteric fracture of  the proximal right femur.  CXR 4/9 >> Progressive enlargement of right-sided pleural effusion now moderate to large in size with associated right lung atelectasis.  Mild atelectasis medial aspect left lung base.  Pulmonary vascular congestion.  Cardiomegaly post CABG and valve replacement  ECHO:Echo with preserved EF and severe pulmonary hypertension with estimated RV systolic pressure 75 mmHg.Suspect acute on chronic diastolic heart failure.  Severe pulmonary hypertension.The pulmonary HTN is likely related to her chronic MR as per cardiology.   BRIEF PATIENT DESCRIPTION:  ASSESSMENT / PLAN: Patient with dementia. Newly s/p right hip surgery. She is hypopneic and doesn't look well. We are in the midst of deciding where she will go post-op.   Acute  respiratory failure The patient remains on ventialtory support. She was very sedated following her surgery and developed hypercarbic resp failure. We are going to trial her on SBT presently. She was more awake late yesterday and today. Had good volumes on her earlier SBT trial. The thoracentesi may have helped [place] to wen her as well ABG suggest a mild metabolic alkalosis presently.   Pleural effusions The patient appears to have diastolic heart failure and bilateral effusions.  It is probably due to CHF. There may be some compression atelectasis as well.  The atiet had 12000 cc withdrawn yesterday. The fluid only had 105 WBVCS and an albumin of 1.5, LDH < 100 so it appears to be a transudate.  Altered mental status We are assuming at this point that the patient developed acute hypercarbic resp failure in the setting of her surgery perhaps secondary  To the sedating medications ( although minimal as they were). I don't think the patient is septic or had a CVA at this time.  BP  BP is well controlled presently  Renal Renal fn. Is normal Her urine output has been a little scant.

## 2017-04-27 NOTE — Procedures (Signed)
Extubation Procedure Note  Patient Details:   Name: Regan Rakersnn B Rodden DOB: 08/06/1932 MRN: [id number]   Airway Documentation:     Evaluation  O2 sats: stable throughout Complications: No apparent complications Patient did tolerate procedure well. Bilateral Breath Sounds: Clear   Yes   Patient extubated to 4lnc. Vital signs stable at this time. No complications. RN at bedside. RT will continue to monitor.  Ave Filterdkins, Vonn Sliger Williams 04/27/2017, 3:24 PM

## 2017-04-27 NOTE — Progress Notes (Addendum)
Patient ID: [name], female   DOB: 01/12/1933, 84 y.o.   MRN: [id number]     Subjective:  Patient intubated and family at the bedside reports that she may be some better in the AM but decline in the evening  Objective:   VITALS:   Vitals:   04/27/17 1423 04/27/17 1425 04/27/17 1456 04/27/17 1500  BP: (!) 79/45 (!) 87/63 (!) 88/55 (!) 131/116  Pulse:   68 76  Resp:   19 (!) 21  Temp:    97.7 F (36.5 C)  TempSrc:    Axillary  SpO2:   100% 100%  Weight:      Height:        ABD soft Sensation intact distally Dorsiflexion/Plantar flexion intact Incision: dressing C/D/I and no drainage   Lab Results  Component Value Date   WBC 6.8 04/27/2017   HGB 8.6 (L) 04/27/2017   HCT 29.7 (L) 04/27/2017   MCV 81.8 04/27/2017   PLT 110 (L) 04/27/2017   BMET    Component Value Date/Time   NA 141 04/27/2017 0504   K 3.6 04/27/2017 0504   CL 103 04/27/2017 0504   CO2 31 04/27/2017 0504   GLUCOSE 149 (H) 04/27/2017 0504   BUN 30 (H) 04/27/2017 0504   CREATININE 0.67 04/27/2017 0504   CREATININE 0.91 10/06/2013 0909   CALCIUM 8.3 (L) 04/27/2017 0504   GFRNONAA >60 04/27/2017 0504   GFRNONAA 61 10/06/2013 0909   GFRAA >60 04/27/2017 0504   GFRAA 70 10/06/2013 0909     Assessment/Plan: 2 Days Post-Op   Principal Problem:   Closed displaced intertrochanteric fracture of right femur (HCC) Active Problems:   H/O mitral valve repair   Hypertension, essential, benign   Acute on chronic diastolic (congestive) heart failure (HCC)   Dyslipidemia, goal LDL below 70   PVD (peripheral vascular disease) (HCC)   Dementia   AAA (abdominal aortic aneurysm) (HCC)   Acute respiratory failure with hypoxia (HCC)   CKD (chronic kidney disease), stage III (HCC)   Carotid artery stenosis   Pulmonary hypertension (HCC)   Former tobacco use   Hx of CABG   Pleural effusion, right   Preoperative respiratory examination   Evaluation by psychiatric service required   Hypoxia  Malnutrition of moderate degree   Pressure injury of skin   Advance diet Up with therapy Continue plan per medicine  Dry dressing PRN    Haskel KhanDOUGLAS PARRY, BRANDON 04/27/2017, 3:53 PM  Discsused and agree with above.   Teryl LucyJoshua Annalaya Wile, MD Cell [phone]

## 2017-04-27 NOTE — Progress Notes (Signed)
Progress Note  Patient Name: [name] Date of Encounter: 04/27/2017  Primary Cardiologist: Kirk Ruths, MD   Subjective   Unable to assess.  Intubated.    Inpatient Medications    Scheduled Meds: . aspirin  81 mg Oral Daily  . atorvastatin  40 mg Oral q1800  . chlorhexidine gluconate (MEDLINE KIT)  15 mL Mouth Rinse BID  . diltiazem  10 mg Intravenous Once  . enoxaparin (LOVENOX) injection  30 mg Subcutaneous Q24H  . insulin aspart  1-3 Units Subcutaneous Q4H  . mouth rinse  15 mL Mouth Rinse 10 times per day  . metoprolol tartrate  25 mg Per Tube BID  . ondansetron (ZOFRAN) IV  4 mg Intravenous Once  . pantoprazole (PROTONIX) IV  40 mg Intravenous Daily  . potassium chloride  20 mEq Oral Daily   Continuous Infusions: . dexmedetomidine (PRECEDEX) IV infusion 0.4 mcg/kg/hr (04/27/17 1308)  . diltiazem (CARDIZEM) infusion    . feeding supplement (VITAL AF 1.2 CAL) 40 mL/hr at 04/27/17 1300  . lactated ringers 10 mL/hr at 04/27/17 1300  . lactated ringers 75 mL/hr at 04/27/17 1300  . methocarbamol (ROBAXIN)  IV    . phenylephrine (NEO-SYNEPHRINE) Adult infusion Stopped (04/26/17 0500)   PRN Meds: acetaminophen, bisacodyl, docusate, fentaNYL (SUBLIMAZE) injection, fentaNYL (SUBLIMAZE) injection, iopamidol, methocarbamol **OR** methocarbamol (ROBAXIN)  IV, polyethylene glycol   Vital Signs    Vitals:   04/27/17 1100 04/27/17 1135 04/27/17 1200 04/27/17 1300  BP: 123/64 (!) 141/56 (!) 99/55 (!) 119/57  Pulse:  81 74   Resp: 16 (!) _0 Temp:   97.9 F (36.6 C)   TempSrc:   Axillary   SpO2:   100% 100%  Weight:      Height:        Intake/Output Summary (Last 24 hours) at 04/27/2017 1332 Last data filed at 04/27/2017 1300 Gross per 24 hour  Intake 2323.08 ml  Output 355 ml  Net 1968.08 ml   Filed Weights   04/25/17 1258 04/26/17 0420 04/27/17 0500  Weight: 101 lb 3.1 oz (45.9 kg) 102 lb 4.7 oz (46.4 kg) 110 lb 10.7 oz (50.2 kg)    Telemetry      Sinus rhythm.  3 beats of NSVT - Personally Reviewed  ECG    Sinus rhythm.  Rate 80 bpm. PACs.  - Personally Reviewed  Physical Exam   VS:  BP (!) 87/48 Comment: gtt stopped. MD aware.   Pulse 64   Temp 97.9 F (36.6 C) (Axillary)   Resp 14   Ht _1  (1.575 m)   Wt 110 lb 10.7 oz (50.2 kg)   SpO2 100%   BMI 20.24 kg/m  , BMI Body mass index is 20.24 kg/m. GENERAL:  Intubated and sedated.  HEENT: Pupils equal round and reactive, fundi not visualized, oral mucosa unremarkable NECK:  No jugular venous distention, waveform within normal limits, carotid upstroke brisk and symmetric, no bruits LUNGS:  Clear to auscultation bilaterally HEART:  RRR.  PMI not displaced or sustained,S1 and S2 within normal limits, no S3, no S4, no clicks, no rubs, no murmurs ABD:  Flat, positive bowel sounds normal in frequency in pitch, no bruits, no rebound, no guarding, no midline pulsatile mass, no hepatomegaly, no splenomegaly EXT:  2 plus pulses throughout, no edema, no cyanosis no clubbing SKIN:  No rashes no nodules NEURO:  Moves all 4 extremities.  PSYCH: Unable to assess.  Intubated.    Labs  Chemistry Recent Labs  Lab 04/23/17 0925  04/25/17 0413 04/25/17 1940 04/25/17 2004 04/26/17 0430 04/27/17 0504  NA  --    < > 143 140 142 143 141  K  --    < > 4.2 5.0 4.8 3.7 3.6  CL  --    < > 98* 101  --  102 103  CO2  --    < > 37* 27  --  33* 31  GLUCOSE  --    < > 100* 162*  --  135* 149*  BUN  --    < > 16 19  --  21* 30*  CREATININE  --    < > 0.71 0.78  --  0.80 0.67  CALCIUM  --    < > 8.5* 8.5*  --  8.7* 8.3*  PROT 5.0*  --  5.1*  --   --  4.9*  --   ALBUMIN 2.5*  --  2.4*  --   --  2.5*  --   AST 54*  --  16  --   --  41  --   ALT 17  --  12*  --   --  25  --   ALKPHOS 35*  --  33*  --   --  27*  --   BILITOT 1.9*  --  1.2  --   --  1.2  --   GFRNONAA  --    < > >60 >60  --  >60 >60  GFRAA  --    < > >60 >60  --  >60 >60  ANIONGAP  --    < > 8 12  --  8 7   < > =  values in this interval not displayed.     Hematology Recent Labs  Lab 04/25/17 1940  04/26/17 0530 04/26/17 1430 04/27/17 0504  WBC 7.9  --  4.6  --  6.8  RBC 3.71*  --  3.27*  --  3.63*  HGB 8.4*   < > 7.5* 9.4* 8.6*  HCT 31.8*   < > 27.2* 32.6* 29.7*  MCV 85.7  --  83.2  --  81.8  MCH 22.6*  --  22.9*  --  23.7*  MCHC 26.4*  --  27.6*  --  29.0*  RDW 18.2*  --  18.1*  --  17.9*  PLT 172  --  101*  --  110*   < > = values in this interval not displayed.    Cardiac Enzymes Recent Labs  Lab 04/23/17 2032 04/26/17 1302 04/26/17 1653 04/26/17 2255  TROPONINI 0.45* 1.53* 1.27* 1.06*   No results for input(s): TROPIPOC in the last 168 hours.   BNP Recent Labs  Lab 04/22/17 2156  BNP 3,142.4*     DDimer No results for input(s): DDIMER in the last 168 hours.   Radiology    Dg Chest Port 1 View  Result Date: 04/26/2017 CLINICAL DATA:  Post thoracentesis EXAM: PORTABLE CHEST 1 VIEW COMPARISON:  04/26/2017, 04/25/2017 FINDINGS: Support devices obscure the upper chest. Endotracheal tube tip is about 2.7 cm superior to the carina. Esophageal tube tip is below the diaphragm but non included. Post sternotomy changes and valve prosthesis. Decreased right greater than left pleural effusions. No definitive right pneumothorax. Improved aeration of the right lower lung. Residual consolidation at the right middle lobe and right base. Mediastinal silhouette is exaggerated by patient rotation. Dense aortic atherosclerosis. IMPRESSION: 1. Decreased small bilateral right greater  than left pleural effusions. No definitive right pneumothorax seen 2. Persistent airspace disease at the right middle lobe and right base, atelectasis versus pneumonia. Electronically Signed   By: Donavan Foil M.D.   On: 04/26/2017 15:45   Portable Chest Xray  Result Date: 04/26/2017 CLINICAL DATA:  Endotracheal tube EXAM: PORTABLE CHEST 1 VIEW COMPARISON:  04/25/2017 FINDINGS: Cardiac shadow is stable.  Postsurgical changes are again seen. Endotracheal tube and nasogastric catheter are noted in satisfactory position. Bilateral pleural effusions are again seen right greater than left. Underlying atelectasis is present. IMPRESSION: Stable bilateral pleural effusions and atelectasis. Electronically Signed   By: Inez Catalina M.D.   On: 04/26/2017 07:55   Dg Chest Port 1 View  Result Date: 04/25/2017 CLINICAL DATA:  Endotracheal tube present.  OG tube placement. EXAM: PORTABLE CHEST 1 VIEW COMPARISON:  Radiograph earlier this day at 0617 hour FINDINGS: Endotracheal tube 2.2 cm from the carina. Enteric tube in place with tip and side-port below the diaphragm in the stomach. Post median sternotomy. Unchanged cardiomegaly and mediastinal contours. Aortic atherosclerosis. Unchanged right pleural effusion and basilar airspace disease. Improved left pleural effusion. Improving bilateral airspace disease. No pneumothorax. The bones are under mineralized. IMPRESSION: 1. Endotracheal tube tip 2.2 cm from the carina. Tip and side port of the enteric tube below the diaphragm. 2. Bilateral pleural effusions, moderate on the right and small on the left, with improvement from exam earlier this day. 3. Improving aeration with improvement in bilateral airspace disease. Electronically Signed   By: Jeb Levering M.D.   On: 04/25/2017 21:47   Dg C-arm 1-60 Min  Result Date: 04/25/2017 CLINICAL DATA:  Right intertrochanteric femur fracture ORIF. EXAM: DG C-ARM 61-120 MIN; RIGHT FEMUR 2 VIEWS COMPARISON:  Right hip x-rays dated April 22, 2017. FINDINGS: Intraoperative x-rays demonstrate interval placement of a right femoral cephalomedullary rod with distal interlocking screw. Near anatomic alignment of the dominant intertrochanteric fracture fragments. The lesser trochanter remains mildly displaced medially. Right hip osteoarthritis. Vascular calcifications. IMPRESSION: 1. Improved alignment of the right intertrochanteric femur  fracture status post ORIF. FLUOROSCOPY TIME:  1 minutes, 37 seconds. C-arm fluoroscopic images were obtained intraoperatively and submitted for post operative interpretation. Electronically Signed   By: Titus Dubin M.D.   On: 04/25/2017 15:46   Dg Femur, Min 2 Views Right  Result Date: 04/25/2017 CLINICAL DATA:  Right intertrochanteric femur fracture ORIF. EXAM: DG C-ARM 61-120 MIN; RIGHT FEMUR 2 VIEWS COMPARISON:  Right hip x-rays dated April 22, 2017. FINDINGS: Intraoperative x-rays demonstrate interval placement of a right femoral cephalomedullary rod with distal interlocking screw. Near anatomic alignment of the dominant intertrochanteric fracture fragments. The lesser trochanter remains mildly displaced medially. Right hip osteoarthritis. Vascular calcifications. IMPRESSION: 1. Improved alignment of the right intertrochanteric femur fracture status post ORIF. FLUOROSCOPY TIME:  1 minutes, 37 seconds. C-arm fluoroscopic images were obtained intraoperatively and submitted for post operative interpretation. Electronically Signed   By: Titus Dubin M.D.   On: 04/25/2017 15:46   US Thoracentesis Asp Pleural Space W/img Guide  Result Date: 04/26/2017 INDICATION: Shortness of breath. Respiratory failure. Right-sided pleural effusion. Request diagnostic and therapeutic thoracentesis. EXAM: ULTRASOUND GUIDED RIGHT THORACENTESIS MEDICATIONS: None. COMPLICATIONS: None immediate. PROCEDURE: An ultrasound guided thoracentesis was thoroughly discussed with the patient and questions answered. The benefits, risks, alternatives and complications were also discussed. The patient understands and wishes to proceed with the procedure. Written consent was obtained. Ultrasound was performed to localize and mark an adequate pocket of fluid in the right  chest. The area was then prepped and draped in the normal sterile fashion. 1% Lidocaine was used for local anesthesia. Under ultrasound guidance a Safe-T-Centesis catheter  was introduced. Thoracentesis was performed. The catheter was removed and a dressing applied. FINDINGS: A total of approximately 1 L of clear yellow fluid was removed. Samples were sent to the laboratory as requested by the clinical team. IMPRESSION: Successful ultrasound guided right thoracentesis yielding 1 L of pleural fluid. Read by: Ascencion Dike PA-C Electronically Signed   By: Corrie Mckusick D.O.   On: 04/26/2017 15:47    Cardiac Studies   Echo 04/23/17: Study Conclusions  - Left ventricle: The cavity size was normal. Wall thickness was   normal. Systolic function was normal. The estimated ejection   fraction was in the range of 60% to 65%. Images were inadequate   for LV wall motion assessment. - Aortic valve: There was trivial regurgitation. - Mitral valve: s/p annuloplasty repair. No significant stenosis.   Mild regurgitation. Valve area by continuity equation (using LVOT   flow): 1.68 cm^2. - Left atrium: The atrium was normal in size. - Right ventricle: The cavity size was mildly dilated. Moderately   reduced RV systolic function. - Right atrium: The atrium was normal in size. - Tricuspid valve: There was moderate regurgitation. - Pulmonary arteries: PA peak pressure: 75 mm Hg (S). - Inferior vena cava: The vessel was dilated. The respirophasic   diameter changes were blunted (< 50%), consistent with elevated   central venous pressure. - Pericardium, extracardiac: Ascites is noted.  Patient Profile     84 y.o. female with CAD s/p CABG, MV repair, chronic diastolic heart failure, severe pulmonary hypertension, and dementia here with hip fracture s/p repair.  Hospital course complicated by respiratory failure and atrial fibrillation with RVR.   Assessment & Plan    # Paroxysmal atrial fibrillation:  Diltiazem was never started because she reverted to sinus rhythm.  Continue metoprolol.  BP currently low after trying Precedex.  This was likely post-surgical.  If she has  recurrent atrial fibrillation would anticoagulate when OK per surgery.   # Acute on chronic diastolic heart failure: # Severe pulmonary hypertension:  BNP 3142. Bilateral pleural effusions.   Last lasix was 2 days ago and she has a postive fluid balance fluid balance by I/O but appears euvolemic on exam.  Would attempt gentle diuresis once BP improves.  PASP 75 mmHg 04/23/17, IV dilated (RA pressure at least 15 mmHg) and there was evidence of ascites.     # Hypoxic respiratory failure: Diuresis as above.  Per critical care.   # Hyperlipidemia: Continue atorvastatin.  For questions or updates, please contact [place] Please consult [email or web address] for contact info under Cardiology/STEMI.      Signed, Skeet Latch, MD  04/27/2017, 1:32 PM

## 2017-04-27 NOTE — Progress Notes (Signed)
Precedex order given for patient's tachypnea and anxiety whenever attempting to wean off ventilator. After about 45 min. BP lowered into the 70's. Precedex stopped and MD aware. BP normalizing at this time. Therapies at the bedside. Monitoring closely. Tanveer Brammer, [place] ScrapeSarah E, RN

## 2017-04-27 NOTE — Evaluation (Signed)
Occupational Therapy Evaluation Patient Details Name: [name] MRN: [id number] DOB: November 17, 1932 Today's Date: 04/27/2017    History of Present Illness 84 y.o. female admitted on 04/22/17 for fall with R femur fx.  Pt s/p IM Nail and is WBAT post op.  Pt with complications of pleural effusion s/p thorcentesis 04/26/17, was still ventilated with ETT at time of PT eval (04/27/17), and A-fib   Clinical Impression   PTA, pt was living with her husband and was independent. Pt currently requiring Total A for ADLs and ROM exercises. Pt unsafe to mobility to EOB due to low BP (70s/40s) and MAPs in 50-60s. Pt follow simple commands ~50% of time and required Max verbal cues to open her eyes. Pt will require further acute OT to facilitate safe dc. Recommend dc to SNF for further OT to optimize safety, independence with ADLs, and return to PLOF.      Follow Up Recommendations  SNF    Equipment Recommendations  Other (comment)(Defer to next venue)    Recommendations for Other Services PT consult;Speech consult     Precautions / Restrictions Precautions Precautions: Fall Restrictions Weight Bearing Restrictions: Yes RLE Weight Bearing: Weight bearing as tolerated(was NWB pre op, ortho notes post op say WBAT)      Mobility Bed Mobility               General bed mobility comments: Did not attempt EOB, total assist to scoot to [facility]. BPs too soft today to safely sit.   Transfers                      Balance                                           ADL either performed or assessed with clinical judgement   ADL Overall ADL's : Needs assistance/impaired                                       General ADL Comments: Pt requiring total A for all ADLs and ROM at bed level. Pt with decreased following of commands and decreased eye contact. Requiring Max cues to open eyes.      Vision         Perception     Praxis      Pertinent Vitals/Pain  Pain Assessment: Faces Faces Pain Scale: Hurts even more Pain Location: with movement of R leg Pain Descriptors / Indicators: Guarding;Grimacing Pain Intervention(s): Monitored during session;Limited activity within patient's tolerance;Repositioned     Hand Dominance Right   Extremity/Trunk Assessment Upper Extremity Assessment Upper Extremity Assessment: RUE deficits/detail;LUE deficits/detail RUE Deficits / Details: Pt with decreased AROM. Difficulty to assess with lethargic behavior. WFL for ROM. Pt with poor grasp strength. Noted increased edema near IV site at forearm; RN notified RUE Coordination: decreased fine motor;decreased gross motor LUE Deficits / Details: Pt with decreased AROM. Difficulty to assess with lethargic behavior. WFL for ROM. Able to extend fingers on command and with poor grasp strength.  LUE Coordination: decreased fine motor;decreased gross motor   Lower Extremity Assessment Lower Extremity Assessment: Defer to PT evaluation RLE Deficits / Details: right leg with normal post op pain and painful guarding with attempted ROM.  Pt tolerated heel ROM, knee ROM and hip  flexion/abduction ROM in bed.  heels floated to help with wound prevention.  LLE Deficits / Details: left leg with less resistance, but still more stiffness than I would expect for a normal person her age. Ankle knee and hip ROM met with some mild resistance and grimacing, but improved with repeat repetitions. Pt's heel floated on this side as well.        Communication Communication Communication: Other (comment)(intubated ETT)   Cognition Arousal/Alertness: Lethargic Behavior During Therapy: Anxious Overall Cognitive Status: Difficult to assess                                     General Comments  Husband present at begining of session and providing PLOF    Exercises Exercises: General Upper Extremity Total Joint Exercises Ankle Circles/Pumps: PROM;Both;10 reps Heel Slides:  PROM;Both;10 reps Hip ABduction/ADduction: PROM;Both;10 reps General Exercises - Upper Extremity Shoulder Flexion: PROM;Both;5 reps;Supine Elbow Flexion: PROM;Both;5 reps;Supine Elbow Extension: PROM;Both;5 reps;Supine Wrist Flexion: PROM;Both;5 reps;Supine Wrist Extension: PROM;Both;5 reps;Supine   Shoulder Instructions      Home Living Family/patient expects to be discharged to:: Private residence Living Arrangements: Spouse/significant other Available Help at Discharge: Family;Available 24 hours/day Type of Home: House Home Access: Level entry     Home Layout: One level     Bathroom Shower/Tub: Teacher, early years/pre: Standard     Home Equipment: Walker - 2 wheels          Prior Functioning/Environment Level of Independence: Independent        Comments: ADLs and light ADLs including cooking and cleaning. Driving.         OT Problem List: Decreased range of motion;Decreased activity tolerance;Decreased strength;Impaired balance (sitting and/or standing);Decreased cognition;Decreased safety awareness;Decreased knowledge of use of DME or AE;Decreased knowledge of precautions;Pain;Increased edema      OT Treatment/Interventions: Self-care/ADL training;Therapeutic exercise;Energy conservation;DME and/or AE instruction;Therapeutic activities;Patient/family education    OT Goals(Current goals can be found in the care plan section) Acute Rehab OT Goals Patient Stated Goal: husband wants her to get better and go home. OT Goal Formulation: With patient/family Time For Goal Achievement: 05/11/17 Potential to Achieve Goals: Good ADL Goals Pt Will Perform Eating: with min assist;sitting(supported sitting) Pt Will Perform Grooming: with min assist;sitting(supported sitting) Pt Will Perform Upper Body Dressing: with min assist;sitting Pt Will Transfer to Toilet: with min assist;stand pivot transfer;bedside commode Additional ADL Goal #1: Pt will perform bed  mobility with Min A in prepration for ADLs Additional ADL Goal #2: Pt will follow simple, direct cues 75% of time during ADLs  OT Frequency: Min 2X/week   Barriers to D/C:            Co-evaluation              AM-PAC PT "6 Clicks" Daily Activity     Outcome Measure Help from another person eating meals?: Total Help from another person taking care of personal grooming?: Total Help from another person toileting, which includes using toliet, bedpan, or urinal?: Total Help from another person bathing (including washing, rinsing, drying)?: Total Help from another person to put on and taking off regular upper body clothing?: Total Help from another person to put on and taking off regular lower body clothing?: Total 6 Click Score: 6   End of Session Equipment Utilized During Treatment: Oxygen Nurse Communication: Mobility status;Precautions;Other (comment)(Increased edema at right forearm)  Activity Tolerance: Patient limited by  fatigue;Patient limited by lethargy Patient left: in bed;with call bell/phone within reach;with restraints reapplied;with SCD's reapplied;with family/visitor present;with bed alarm set  OT Visit Diagnosis: Unsteadiness on feet (R26.81);Other abnormalities of gait and mobility (R26.89);Muscle weakness (generalized) (M62.81);Other symptoms and signs involving cognitive function;Pain Pain - Right/Left: Right Pain - part of body: Hip                Time: 6244-6950 OT Time Calculation (min): 28 min Charges:  OT General Charges $OT Visit: 1 Visit OT Evaluation $OT Eval Moderate Complexity: 1 Mod G-Codes:     Craig, OTR/L Acute Rehab Pager: [phone] Office: [place] 04/27/2017, 5:26 PM

## 2017-04-28 DIAGNOSIS — I6529 Occlusion and stenosis of unspecified carotid artery: Secondary | ICD-10-CM

## 2017-04-28 LAB — BASIC METABOLIC PANEL
Anion gap: 6 (ref 5–15)
BUN: 22 mg/dL — AB (ref 6–20)
CHLORIDE: 104 mmol/L (ref 101–111)
CO2: 31 mmol/L (ref 22–32)
CREATININE: 0.51 mg/dL (ref 0.44–1.00)
Calcium: 7.9 mg/dL — ABNORMAL LOW (ref 8.9–10.3)
GFR calc Af Amer: >60 mL/min (ref >60)
GFR calc non Af Amer: >60 mL/min (ref >60)
GLUCOSE: 76 mg/dL (ref 65–99)
Potassium: 3.8 mmol/L (ref 3.5–5.1)
Sodium: 141 mmol/L (ref 135–145)

## 2017-04-28 LAB — CBC WITH DIFFERENTIAL/PLATELET
Basophils Absolute: 0 10*3/uL (ref 0.0–0.1)
Basophils Relative: 0 %
Eosinophils Absolute: 0.1 10*3/uL (ref 0.0–0.7)
Eosinophils Relative: 1 %
HEMATOCRIT: 28 % — AB (ref 36.0–46.0)
HEMOGLOBIN: 7.9 g/dL — AB (ref 12.0–15.0)
LYMPHS ABS: 0.7 10*3/uL (ref 0.7–4.0)
Lymphocytes Relative: 12 %
MCH: 23.7 pg — AB (ref 26.0–34.0)
MCHC: 28.2 g/dL — AB (ref 30.0–36.0)
MCV: 84.1 fL (ref 78.0–100.0)
MONOS PCT: 6 %
Monocytes Absolute: 0.3 10*3/uL (ref 0.1–1.0)
NEUTROS ABS: 4.6 10*3/uL (ref 1.7–7.7)
NEUTROS PCT: 81 %
Platelets: 113 10*3/uL — ABNORMAL LOW (ref 150–400)
RBC: 3.33 MIL/uL — ABNORMAL LOW (ref 3.87–5.11)
RDW: 18.3 % — ABNORMAL HIGH (ref 11.5–15.5)
WBC: 5.7 10*3/uL (ref 4.0–10.5)

## 2017-04-28 LAB — GLUCOSE, CAPILLARY
GLUCOSE-CAPILLARY: 73 mg/dL (ref 65–99)
GLUCOSE-CAPILLARY: 74 mg/dL (ref 65–99)
Glucose-Capillary: 86 mg/dL (ref 65–99)

## 2017-04-28 LAB — PH, BODY FLUID: pH, Body Fluid: 7.7

## 2017-04-28 MED ORDER — METOPROLOL TARTRATE 50 MG PO TABS
50.0000 mg | ORAL_TABLET | Freq: Two times a day (BID) | ORAL | Status: DC
  Administered 2017-04-28 – 2017-05-06 (×16): 50 mg
  Filled 2017-04-28 (×16): qty 1

## 2017-04-28 MED ORDER — ENALAPRIL MALEATE 20 MG PO TABS
20.0000 mg | ORAL_TABLET | Freq: Every day | ORAL | Status: DC
Start: 2017-04-28 — End: 2017-05-06
  Administered 2017-04-28 – 2017-05-06 (×9): 20 mg via ORAL
  Filled 2017-04-28 (×10): qty 1

## 2017-04-28 MED ORDER — FUROSEMIDE 40 MG PO TABS
40.0000 mg | ORAL_TABLET | Freq: Every day | ORAL | Status: DC
  Administered 2017-04-28 – 2017-05-01 (×4): 40 mg via ORAL
  Filled 2017-04-28 (×4): qty 1

## 2017-04-28 NOTE — Progress Notes (Signed)
Progress Note  Patient Name: [name] Date of Encounter: 04/28/2017  Primary Cardiologist: Olga Millers, MD   Subjective   Feeling much better.  Wants to go home.  Denies pain or shortness of breath.  Upset that her legs are restrained (they are not).  Inpatient Medications    Scheduled Meds: . aspirin  81 mg Oral Daily  . atorvastatin  40 mg Oral q1800  . enalapril  20 mg Oral Daily  . enoxaparin (LOVENOX) injection  30 mg Subcutaneous Q24H  . mouth rinse  15 mL Mouth Rinse BID  . metoprolol tartrate  25 mg Per Tube BID  . ondansetron (ZOFRAN) IV  4 mg Intravenous Once  . potassium chloride  20 mEq Oral Daily   Continuous Infusions: . phenylephrine (NEO-SYNEPHRINE) Adult infusion Stopped (04/26/17 0500)   PRN Meds: acetaminophen, bisacodyl, docusate, iopamidol, polyethylene glycol   Vital Signs    Vitals:   04/28/17 0900 04/28/17 1000 04/28/17 1100 04/28/17 1200  BP: (!) 147/65 (!) 150/86 136/87 (!) 137/59  Pulse: 73 75 73 68  Resp: (!) 24 (!) 38 (!) 27 (!) 29  Temp:    (!) 97.3 F (36.3 C)  TempSrc:    Oral  SpO2: 100% 100% 100% 100%  Weight:      Height:        Intake/Output Summary (Last 24 hours) at 04/28/2017 1301 Last data filed at 04/28/2017 0700 Gross per 24 hour  Intake 1809.75 ml  Output 500 ml  Net 1309.75 ml   Filed Weights   04/26/17 0420 04/27/17 0500 04/28/17 0618  Weight: 102 lb 4.7 oz (46.4 kg) 110 lb 10.7 oz (50.2 kg) 109 lb 2 oz (49.5 kg)    Telemetry    Sinus rhythm.  PVCs, NSVT and SVT.Marland Kitchen Personally Reviewed  ECG    Sinus rhythm.  Rate 80 bpm. PACs.  - Personally Reviewed  Physical Exam   VS:  BP (!) 156/68   Pulse 78   Temp (!) 97.3 F (36.3 C) (Oral)   Resp (!) 39   Ht 5\' 2"  (1.575 m)   Wt 109 lb 2 oz (49.5 kg)   SpO2 100%   BMI 19.96 kg/m  , BMI Body mass index is 19.96 kg/m. GENERAL:  Well appearing HEENT: Pupils equal round and reactive, fundi not visualized, oral mucosa unremarkable NECK:  No jugular  venous distention, waveform within normal limits, carotid upstroke brisk and symmetric, no bruits  LUNGS:  Clear to auscultation bilaterally HEART:  RRR.  PMI not displaced or sustained,S1 and S2 within normal limits, no S3, no S4, no clicks, no rubs, no murmurs ABD:  Flat, positive bowel sounds normal in frequency in pitch, no bruits, no rebound, no guarding, no midline pulsatile mass, no hepatomegaly, no splenomegaly EXT:  2 plus pulses throughout, trace edema, no cyanosis no clubbing SKIN:  No rashes no nodules NEURO:  Cranial nerves II through XII grossly intact, motor grossly intact throughout PSYCH:  Cognitively intact, oriented to person place and time   Labs    Chemistry Recent Labs  Lab 04/23/17 0925  04/25/17 0413  04/26/17 0430 04/27/17 0504 04/28/17 0612  NA  --    < > 143   < > 143 141 141  K  --    < > 4.2   < > 3.7 3.6 3.8  CL  --    < > 98*   < > 102 103 104  CO2  --    < >  37*   < > 33* 31 31  GLUCOSE  --    < > 100*   < > 135* 149* 76  BUN  --    < > 16   < > 21* 30* 22*  CREATININE  --    < > 0.71   < > 0.80 0.67 0.51  CALCIUM  --    < > 8.5*   < > 8.7* 8.3* 7.9*  PROT 5.0*  --  5.1*  --  4.9*  --   --   ALBUMIN 2.5*  --  2.4*  --  2.5*  --   --   AST 54*  --  16  --  41  --   --   ALT 17  --  12*  --  25  --   --   ALKPHOS 35*  --  33*  --  27*  --   --   BILITOT 1.9*  --  1.2  --  1.2  --   --   GFRNONAA  --    < > >60   < > >60 >60 >60  GFRAA  --    < > >60   < > >60 >60 >60  ANIONGAP  --    < > 8   < > 8 7 6    < > = values in this interval not displayed.     Hematology Recent Labs  Lab 04/26/17 0530 04/26/17 1430 04/27/17 0504 04/28/17 0612  WBC 4.6  --  6.8 5.7  RBC 3.27*  --  3.63* 3.33*  HGB 7.5* 9.4* 8.6* 7.9*  HCT 27.2* 32.6* 29.7* 28.0*  MCV 83.2  --  81.8 84.1  MCH 22.9*  --  23.7* 23.7*  MCHC 27.6*  --  29.0* 28.2*  RDW 18.1*  --  17.9* 18.3*  PLT 101*  --  110* 113*    Cardiac Enzymes Recent Labs  Lab 04/23/17 2032  04/26/17 1302 04/26/17 1653 04/26/17 2255  TROPONINI 0.45* 1.53* 1.27* 1.06*   No results for input(s): TROPIPOC in the last 168 hours.   BNP Recent Labs  Lab 04/22/17 2156  BNP 3,142.4*     DDimer No results for input(s): DDIMER in the last 168 hours.   Radiology    Dg Chest Port 1 View  Result Date: 04/26/2017 CLINICAL DATA:  Post thoracentesis EXAM: PORTABLE CHEST 1 VIEW COMPARISON:  04/26/2017, 04/25/2017 FINDINGS: Support devices obscure the upper chest. Endotracheal tube tip is about 2.7 cm superior to the carina. Esophageal tube tip is below the diaphragm but non included. Post sternotomy changes and valve prosthesis. Decreased right greater than left pleural effusions. No definitive right pneumothorax. Improved aeration of the right lower lung. Residual consolidation at the right middle lobe and right base. Mediastinal silhouette is exaggerated by patient rotation. Dense aortic atherosclerosis. IMPRESSION: 1. Decreased small bilateral right greater than left pleural effusions. No definitive right pneumothorax seen 2. Persistent airspace disease at the right middle lobe and right base, atelectasis versus pneumonia. Electronically Signed   By: Jasmine Pang M.D.   On: 04/26/2017 15:45   US Thoracentesis Asp Pleural Space W/img Guide  Result Date: 04/26/2017 INDICATION: Shortness of breath. Respiratory failure. Right-sided pleural effusion. Request diagnostic and therapeutic thoracentesis. EXAM: ULTRASOUND GUIDED RIGHT THORACENTESIS MEDICATIONS: None. COMPLICATIONS: None immediate. PROCEDURE: An ultrasound guided thoracentesis was thoroughly discussed with the patient and questions answered. The benefits, risks, alternatives and complications were also discussed. The patient understands and wishes to  proceed with the procedure. Written consent was obtained. Ultrasound was performed to localize and mark an adequate pocket of fluid in the right chest. The area was then prepped and draped  in the normal sterile fashion. 1% Lidocaine was used for local anesthesia. Under ultrasound guidance a Safe-T-Centesis catheter was introduced. Thoracentesis was performed. The catheter was removed and a dressing applied. FINDINGS: A total of approximately 1 L of clear yellow fluid was removed. Samples were sent to the laboratory as requested by the clinical team. IMPRESSION: Successful ultrasound guided right thoracentesis yielding 1 L of pleural fluid. Read by: Brayton ElKevin Bruning PA-C Electronically Signed   By: Gilmer MorJaime  Wagner D.O.   On: 04/26/2017 15:47    Cardiac Studies   Echo 04/23/17: Study Conclusions  - Left ventricle: The cavity size was normal. Wall thickness was   normal. Systolic function was normal. The estimated ejection   fraction was in the range of 60% to 65%. Images were inadequate   for LV wall motion assessment. - Aortic valve: There was trivial regurgitation. - Mitral valve: s/p annuloplasty repair. No significant stenosis.   Mild regurgitation. Valve area by continuity equation (using LVOT   flow): 1.68 cm^2. - Left atrium: The atrium was normal in size. - Right ventricle: The cavity size was mildly dilated. Moderately   reduced RV systolic function. - Right atrium: The atrium was normal in size. - Tricuspid valve: There was moderate regurgitation. - Pulmonary arteries: PA peak pressure: 75 mm Hg (S). - Inferior vena cava: The vessel was dilated. The respirophasic   diameter changes were blunted (< 50%), consistent with elevated   central venous pressure. - Pericardium, extracardiac: Ascites is noted.  Patient Profile     84 y.o. female with CAD s/p CABG, MV repair, chronic diastolic heart failure, severe pulmonary hypertension, and dementia here with hip fracture s/p repair.  Hospital course complicated by respiratory failure and atrial fibrillation with RVR.   Assessment & Plan    # Paroxysmal atrial fibrillation:  Diltiazem was never started because she reverted  to sinus rhythm.  She has paroxysms of SVT.  We will increase metoprolol to 50mg  bid. Her atrial fibrillation was likely post-surgical.  If she has recurrent atrial fibrillation would anticoagulate when OK per surgery.   # Acute on chronic diastolic heart failure: # Severe pulmonary hypertension:  Volume status seems OK today.  She is mildly volume overloaded.  Will start lasix 40mg  PO daily.  She takes lasix 40mg  daily prn at home.  PASP 75 mmHg 04/23/17, IV dilated (RA pressure at least 15 mmHg) and there was evidence of ascites.     # Hypoxic respiratory failure: Diuresis as above.  Per critical care.   # Hyperlipidemia: Continue atorvastatin.  # Positive troponin:  Troponin peaked at 1.53. Echo showed normal systolic function though wall motion assessment was limited.  She has no chest pain or shortness of breath.  No ischemic work up at this time.   For questions or updates, please contact [facility] Please consult [email or web address] for contact info under Cardiology/STEMI.      Signed, Chilton Siiffany Elgin, MD  04/28/2017, 1:01 PM

## 2017-04-28 NOTE — Progress Notes (Signed)
Patient trasfered from 4N to [phone] via bed; alert and oriented x 2; no complaints of pain; IV saline locked in right hand. Orient patient to room and unit; instructed how to use the call bell and  fall risk precautions. Will continue to monitor the patient.

## 2017-04-28 NOTE — Progress Notes (Signed)
Name: [name] MRN: [id number] DOB: 12/12/1932    ADMISSION DATE:  04/22/2017 CONSULTATION DATE:  04/23/2017  REFERRING MD :  Dr. Janee Mornhompson  CHIEF COMPLAINT:  Hypoxia/ R pleural effusion  HISTORY OF PRESENT ILLNESS:   HPI obtained from medical chart review as patient is confused and not able to given accurate history.  84 year old female with PMH significant for but not limited to former smoker, diastolic HF, CAD s/p MVR and CABG 2008, AAA, HTN, HLD, PVD and questionable dementia who [facility] with right intertrochanteric fracture.  On exam, patient is oriented to herself and place however clearly confused, unable to explain why she is hospitalized, denies being married, and is pleasant and inappropriate in her responses.  No family at bedside.    Patient is from home, lives with her husband.  Apparently had mechanical fall yesterday with resultant right hip pain found to have right intertrochanteric fracture.  Family reports several days of confusion, visual hallucinations involving her cat, and worsening edema to lower extremities.  Questionable if patient is compliant with home medications including diuretics.  Additionally, patient was hypoxic initially requiring O2 found to have large right pleural effusion, saturating 100% on 2L.  Labs noted for BNP 3142, negative PCT, Tmax 99.3, normal WBC.  Patient has been refusing most care, including surgery, and her competency in making decisions is pending psych evaluation.  There is no established HPOA.   Additionally husband has been having difficulty in guiding decision making and their son is out of the country.     4/11 I was asked to ress the patient by the hospitalist He was concerned that the patient may need a thoracentesis. The patient just had her right hip surgery. She is pale and poorly responsive. She had 25 mcg of Fentanyl durng the intervention. She is grossly hypopneic and remains poorly responsive. She got 25  mcg opf propofol as well. We are going to check her Hb as she appears pale. I gave her 2 amps of .04 narcan as well. Her BP is ok. Her heart rate is about 115 and Bp is about 125/70.  4/13 The patient remains on mechanical ventiltion. Has been more awake. Got a bit agitated earlierr so she received a dose of fentanyl. May need to place patient initially on Precedex to allow weaning to transition a little easier. Had thoracentesis yesterday where 1000cc.   Vitals are stable.  4/14 Patient extubated yesterday and doing well. She is full alert today and cogent. Has asked about going home and getting out of beed. Eager to have Physical therapy. I am going to stop the IV fluids at this point. I have restarted her home Vasotec 20mg  once a day. She is not on diuterics presently. She can proabbly mpve  tpo the regualr floor    PAST MEDICAL HISTORY :   has a past medical history of AAA (abdominal aortic aneurysm) (HCC), Carotid artery stenosis, Chronic diastolic heart failure (HCC), Coronary atherosclerosis of native coronary artery, Dementia, Hypercholesteremia, Hypertension, essential, benign, PVD (peripheral vascular disease) (HCC), and Rhinitis, allergic.  has a past surgical history that includes Mitral valve repair (05/22/2006); Coronary artery bypass graft (05/22/2006); Carotid endarterectomy (03/2008); Colonoscopy (1996); and Intramedullary (im) nail intertrochanteric (Right, 04/25/2017). Prior to Admission medications   Medication Sig Start Date End Date Taking? Authorizing Provider  amLODipine (NORVASC) 5 MG tablet TAKE 1 TABLET (5 MG TOTAL) BY MOUTH DAILY. 02/13/16  Yes Lewayne Buntingrenshaw, Brian S, MD  aspirin 81 MG tablet Take 81 mg by mouth daily.     Yes [provider]  atorvastatin (LIPITOR) 40 MG tablet Take 1 tablet (40 mg total) by mouth daily at 6 PM. NEED OV. 01/07/17  Yes Crenshaw, [name], MD  enalapril (VASOTEC) 20 MG tablet TAKE 1 TABLET (20 MG TOTAL) BY MOUTH 2 (TWO) TIMES  DAILY. NEED OV. Patient taking differently: Take 20 mg by mouth daily.  04/01/17  Yes Lewayne Bunting, MD  furosemide (LASIX) 40 MG tablet TAKE 1 TABLET BY MOUTH EVERY DAY Patient taking differently: 40mg  by mouth daily as needed for fluid 03/23/16  Yes Crenshaw, [name], MD  KLOR-CON M20 20 MEQ tablet TAKE 1 TABLET EVERY DAY -MUST MAKE APPOINTMENT FOR FURTHER REFILLS 07/10/16  Yes Lewayne Bunting, MD  metoprolol succinate (TOPROL-XL) 100 MG 24 hr tablet TAKE 1 TABLET (100 MG TOTAL) BY MOUTH DAILY. NEED OV. 04/01/17  Yes Crenshaw, [name], MD   No Known Allergies  FAMILY HISTORY:  family history includes Heart attack in her mother. SOCIAL HISTORY:  reports that she quit smoking about 11 years ago. Her smoking use included cigarettes. She has never used smokeless tobacco. She reports that she drank alcohol. She reports that she does not use drugs.  REVIEW OF SYSTEMS:   Unable to obtain as patient is unable to provide based on acute, questionably chronic confusion.  SUBJECTIVE:  Patient without complaints.  Denies SOB or pain.  VITAL SIGNS: Temp:  [97.7 F (36.5 C)-98.7 F (37.1 C)] 98.1 F (36.7 C) (04/14 0400) Pulse Rate:  [64-81] 67 (04/14 0700) Resp:  [14-32] 19 (04/14 0700) BP: (79-158)/(45-116) 135/62 (04/14 0700) SpO2:  [90 %-100 %] 100 % (04/14 0700) Arterial Line BP: (88-163)/(38-79) 151/57 (04/14 0700) FiO2 (%):  [40 %] 40 % (04/13 1136) Weight:  [109 lb 2 oz (49.5 kg)] 109 lb 2 oz (49.5 kg) (04/14 0618)  PHYSICAL EXAMINATION: General:  Frail, thin, elderly female lying in bed in NAD HEENT: MM pink/dry, pupils 3/ reactive  Neuro: Patient significantly obtunded, non-responsive CV: RRR, +S4 PULMChest diminsihed BS right base, appears comfortable  GI: soft, non-tender, bs active  Extremities: warm/dry, BLE 3+ edema, Right leg shortened and internally rotated Skin: no rashes  Recent Labs  Lab 04/26/17 0430 04/27/17 0504 04/28/17 0612  NA 143 141 141  K 3.7 3.6 3.8    CL 102 103 104  CO2 33* 31 31  BUN 21* 30* 22*  CREATININE 0.80 0.67 0.51  GLUCOSE 135* 149* 76   Recent Labs  Lab 04/26/17 0530 04/26/17 1430 04/27/17 0504 04/28/17 0612  HGB 7.5* 9.4* 8.6* 7.9*  HCT 27.2* 32.6* 29.7* 28.0*  WBC 4.6  --  6.8 5.7  PLT 101*  --  110* 113*   Dg Chest Port 1 View  Result Date: 04/26/2017 CLINICAL DATA:  Post thoracentesis EXAM: PORTABLE CHEST 1 VIEW COMPARISON:  04/26/2017, 04/25/2017 FINDINGS: Support devices obscure the upper chest. Endotracheal tube tip is about 2.7 cm superior to the carina. Esophageal tube tip is below the diaphragm but non included. Post sternotomy changes and valve prosthesis. Decreased right greater than left pleural effusions. No definitive right pneumothorax. Improved aeration of the right lower lung. Residual consolidation at the right middle lobe and right base. Mediastinal silhouette is exaggerated by patient rotation. Dense aortic atherosclerosis. IMPRESSION: 1. Decreased small bilateral right greater than left pleural effusions. No definitive right pneumothorax seen 2. Persistent airspace disease at the right middle lobe and right base,  atelectasis versus pneumonia. Electronically Signed   By: Jasmine Pang M.D.   On: 04/26/2017 15:45   US Thoracentesis Asp Pleural Space W/img Guide  Result Date: 04/26/2017 INDICATION: Shortness of breath. Respiratory failure. Right-sided pleural effusion. Request diagnostic and therapeutic thoracentesis. EXAM: ULTRASOUND GUIDED RIGHT THORACENTESIS MEDICATIONS: None. COMPLICATIONS: None immediate. PROCEDURE: An ultrasound guided thoracentesis was thoroughly discussed with the patient and questions answered. The benefits, risks, alternatives and complications were also discussed. The patient understands and wishes to proceed with the procedure. Written consent was obtained. Ultrasound was performed to localize and mark an adequate pocket of fluid in the right chest. The area was then prepped and  draped in the normal sterile fashion. 1% Lidocaine was used for local anesthesia. Under ultrasound guidance a Safe-T-Centesis catheter was introduced. Thoracentesis was performed. The catheter was removed and a dressing applied. FINDINGS: A total of approximately 1 L of clear yellow fluid was removed. Samples were sent to the laboratory as requested by the clinical team. IMPRESSION: Successful ultrasound guided right thoracentesis yielding 1 L of pleural fluid. Read by: Brayton El PA-C Electronically Signed   By: Gilmer Mor D.O.   On: 04/26/2017 15:47    SIGNIFICANT EVENTS  4/8 Admit  STUDIES:  CXR 4/8 hip >> Comminuted intratrochanteric fracture of the proximal right femur.  CXR 4/9 >> Progressive enlargement of right-sided pleural effusion now moderate to large in size with associated right lung atelectasis.  Mild atelectasis medial aspect left lung base.  Pulmonary vascular congestion.  Cardiomegaly post CABG and valve replacement  ECHO:Echo with preserved EF and severe pulmonary hypertension with estimated RV systolic pressure 75 mmHg.Suspect acute on chronic diastolic heart failure.  Severe pulmonary hypertension.The pulmonary HTN is likely related to her chronic MR as per cardiology.   BRIEF PATIENT DESCRIPTION:  ASSESSMENT / PLAN: Patient with dementia. Newly s/p right hip surgery. She is hypopneic and doesn't look well. We are in the midst of deciding where she will go post-op.    Acute  respiratory failure  likely in the setting of acute on chronic diastolic HF in the setting of question compliance with home meds, BNP 3142. PCT negative, no suggestive symptoms of infectious process  currently she is maintaining on minimal O2 requirements with no significant increase in WOB. I have just placed the patient on BIPAP until when and if she arouses to support her breathing. It appears more and more that the patient may require reintubation at this point. The patient is intubated  as per family. ABG is pending. CXR shows bilateral effusions R>L. Acute resp failure has resolved. The patient is on nasal 02    Pleural effusions The patient appears to have diastolic heart failure and bilateral effusions.  It is probably due to CHF. There may be some compression atelectasis as well.  We are going to try to get the right effusion tapped 4/12  Altered mental status  Her mental status has tottallu turned around. She sis conversing and makes sense. She is fully alert and awre of her surroundings  BP Her bP is normal presently Her neosymnnpehrine started in the pACU has been discontinued .  Renal Renal status normal               Recent Labs  Lab 04/26/17 0530 04/26/17 1430 04/27/17 0504 04/28/17 0612  HGB 7.5* 9.4* 8.6* 7.9*  HCT 27.2* 32.6* 29.7* 28.0*  WBC 4.6  --  6.8 5.7  PLT 101*  --  110* 113*   Dg  Chest Port 1 View  Result Date: 04/26/2017 CLINICAL DATA:  Post thoracentesis EXAM: PORTABLE CHEST 1 VIEW COMPARISON:  04/26/2017, 04/25/2017 FINDINGS: Support devices obscure the upper chest. Endotracheal tube tip is about 2.7 cm superior to the carina. Esophageal tube tip is below the diaphragm but non included. Post sternotomy changes and valve prosthesis. Decreased right greater than left pleural effusions. No definitive right pneumothorax. Improved aeration of the right lower lung. Residual consolidation at the right middle lobe and right base. Mediastinal silhouette is exaggerated by patient rotation. Dense aortic atherosclerosis. IMPRESSION: 1. Decreased small bilateral right greater than left pleural effusions. No definitive right pneumothorax seen 2. Persistent airspace disease at the right middle lobe and right base, atelectasis versus pneumonia. Electronically Signed   By: Jasmine Pang M.D.   On: 04/26/2017 15:45   US Thoracentesis Asp Pleural Space W/img Guide  Result Date: 04/26/2017 INDICATION: Shortness of breath. Respiratory failure.  Right-sided pleural effusion. Request diagnostic and therapeutic thoracentesis. EXAM: ULTRASOUND GUIDED RIGHT THORACENTESIS MEDICATIONS: None. COMPLICATIONS: None immediate. PROCEDURE: An ultrasound guided thoracentesis was thoroughly discussed with the patient and questions answered. The benefits, risks, alternatives and complications were also discussed. The patient understands and wishes to proceed with the procedure. Written consent was obtained. Ultrasound was performed to localize and mark an adequate pocket of fluid in the right chest. The area was then prepped and draped in the normal sterile fashion. 1% Lidocaine was used for local anesthesia. Under ultrasound guidance a Safe-T-Centesis catheter was introduced. Thoracentesis was performed. The catheter was removed and a dressing applied. FINDINGS: A total of approximately 1 L of clear yellow fluid was removed. Samples were sent to the laboratory as requested by the clinical team. IMPRESSION: Successful ultrasound guided right thoracentesis yielding 1 L of pleural fluid. Read by: Brayton El PA-C Electronically Signed   By: Gilmer Mor D.O.   On: 04/26/2017 15:47    SIGNIFICANT EVENTS  4/8 Admit  STUDIES:  CXR 4/8 hip >> Comminuted intratrochanteric fracture of the proximal right femur.  CXR 4/9 >> Progressive enlargement of right-sided pleural effusion now moderate to large in size with associated right lung atelectasis.  Mild atelectasis medial aspect left lung base.  Pulmonary vascular congestion.  Cardiomegaly post CABG and valve replacement  ECHO:Echo with preserved EF and severe pulmonary hypertension with estimated RV systolic pressure 75 mmHg.Suspect acute on chronic diastolic heart failure.  Severe pulmonary hypertension.The pulmonary HTN is likely related to her chronic MR as per cardiology.   BRIEF PATIENT DESCRIPTION:  ASSESSMENT / PLAN: Patient with dementia. Newly s/p right hip surgery. She is hypopneic and doesn't look  well. We are in the midst of deciding where she will go post-op.   Acute  respiratory failure The patient remains on ventialtory support. She was very sedated following her surgery and developed hypercarbic resp failure. We are going to trial her on SBT presently. She was more awake late yesterday and today. Had good volumes on her earlier SBT trial. The thoracentesi may have helped [place] to wen her as well ABG suggest a mild metabolic alkalosis presently.   Pleural effusions The patient appears to have diastolic heart failure and bilateral effusions.  It is probably due to CHF. There may be some compression atelectasis as well.  The atiet had 12000 cc withdrawn yesterday. The fluid only had 105 WBVCS and an albumin of 1.5, LDH < 100 so it appears to be a transudate.  Altered mental status We are assuming at this point that the  patient developed acute hypercarbic resp failure in the setting of her surgery perhaps secondary  To the sedating medications ( although minimal as they were). I don't think the patient is septic or had a CVA at this time.  BP  BP is well controlled presently  Renal Renal fn. Is normal Her urine output has been a little scant.

## 2017-04-28 NOTE — Progress Notes (Addendum)
Patient ID: [name], female   DOB: 01/09/1933, 84 y.o.   MRN: [id number]     Subjective:  Patient reports pain as mild.  Off the vent and states that she wants to go home  Objective:   VITALS:   Vitals:   04/28/17 0500 04/28/17 0600 04/28/17 0618 04/28/17 0700  BP: (!) 158/73 138/62  135/62  Pulse: 80 69  67  Resp: (!) 32 18  19  Temp:      TempSrc:      SpO2: 100% 100%  100%  Weight:   49.5 kg (109 lb 2 oz)   Height:   5\' 2"  (1.575 m)     ABD soft Sensation intact distally Dorsiflexion/Plantar flexion intact Incision: dressing C/D/I and no drainage   Lab Results  Component Value Date   WBC 5.7 04/28/2017   HGB 7.9 (L) 04/28/2017   HCT 28.0 (L) 04/28/2017   MCV 84.1 04/28/2017   PLT 113 (L) 04/28/2017   BMET    Component Value Date/Time   NA 141 04/28/2017 0612   K 3.8 04/28/2017 0612   CL 104 04/28/2017 0612   CO2 31 04/28/2017 0612   GLUCOSE 76 04/28/2017 0612   BUN 22 (H) 04/28/2017 0612   CREATININE 0.51 04/28/2017 0612   CREATININE 0.91 10/06/2013 0909   CALCIUM 7.9 (L) 04/28/2017 0612   GFRNONAA >60 04/28/2017 0612   GFRNONAA 61 10/06/2013 0909   GFRAA >60 04/28/2017 0612   GFRAA 70 10/06/2013 0909     Assessment/Plan: 3 Days Post-Op   Principal Problem:   Closed displaced intertrochanteric fracture of right femur (HCC) Active Problems:   H/O mitral valve repair   Hypertension, essential, benign   Acute on chronic diastolic (congestive) heart failure (HCC)   Dyslipidemia, goal LDL below 70   PVD (peripheral vascular disease) (HCC)   Dementia   AAA (abdominal aortic aneurysm) (HCC)   Acute respiratory failure with hypoxia (HCC)   CKD (chronic kidney disease), stage III (HCC)   Carotid artery stenosis   Pulmonary hypertension (HCC)   Former tobacco use   Hx of CABG   Pleural effusion, right   Preoperative respiratory examination   Evaluation by psychiatric service required   Hypoxia   Malnutrition of moderate degree   Pressure  injury of skin   Endotracheal tube present   Advance diet Up with therapy Continue plan per medicine WBAT Dry dressing PRN Ortho signing off      Haskel KhanDOUGLAS PARRY, BRANDON 04/28/2017, 12:12 PM  Discussed and agree with above.  Teryl LucyJoshua Desmond Szabo, MD Cell [phone]

## 2017-04-29 DIAGNOSIS — R0902 Hypoxemia: Secondary | ICD-10-CM

## 2017-04-29 DIAGNOSIS — G9340 Encephalopathy, unspecified: Secondary | ICD-10-CM

## 2017-04-29 DIAGNOSIS — J90 Pleural effusion, not elsewhere classified: Secondary | ICD-10-CM

## 2017-04-29 LAB — BASIC METABOLIC PANEL
Anion gap: 9 (ref 5–15)
BUN: 19 mg/dL (ref 6–20)
CHLORIDE: 101 mmol/L (ref 101–111)
CO2: 31 mmol/L (ref 22–32)
CREATININE: 0.51 mg/dL (ref 0.44–1.00)
Calcium: 8.5 mg/dL — ABNORMAL LOW (ref 8.9–10.3)
Glucose, Bld: 79 mg/dL (ref 65–99)
POTASSIUM: 4.1 mmol/L (ref 3.5–5.1)
Sodium: 141 mmol/L (ref 135–145)

## 2017-04-29 LAB — CBC WITH DIFFERENTIAL/PLATELET
BASOS PCT: 0 %
Basophils Absolute: 0 10*3/uL (ref 0.0–0.1)
Eosinophils Absolute: 0.1 10*3/uL (ref 0.0–0.7)
Eosinophils Relative: 1 %
HCT: 33.8 % — ABNORMAL LOW (ref 36.0–46.0)
HEMOGLOBIN: 9.5 g/dL — AB (ref 12.0–15.0)
Lymphocytes Relative: 13 %
Lymphs Abs: 0.9 10*3/uL (ref 0.7–4.0)
MCH: 24.5 pg — ABNORMAL LOW (ref 26.0–34.0)
MCHC: 28.1 g/dL — ABNORMAL LOW (ref 30.0–36.0)
MCV: 87.1 fL (ref 78.0–100.0)
Monocytes Absolute: 0.4 10*3/uL (ref 0.1–1.0)
Monocytes Relative: 6 %
NEUTROS PCT: 80 %
Neutro Abs: 5.4 10*3/uL (ref 1.7–7.7)
PLATELETS: 167 10*3/uL (ref 150–400)
RBC: 3.88 MIL/uL (ref 3.87–5.11)
RDW: 19.1 % — ABNORMAL HIGH (ref 11.5–15.5)
WBC: 6.7 10*3/uL (ref 4.0–10.5)

## 2017-04-29 NOTE — Progress Notes (Addendum)
Name: [name] MRN: [id number] DOB: 10/06/1932    ADMISSION DATE:  04/22/2017 CONSULTATION DATE:  04/23/2017  REFERRING MD :  Dr. Janee Mornhompson  CHIEF COMPLAINT:  Hypoxia/ R pleural effusion  HISTORY OF PRESENT ILLNESS:    84 year old female with PMH significant for but not limited to former smoker, diastolic HF, CAD s/p MVR and CABG 2008, AAA, HTN, HLD, PVD and questionable dementia who [facility] with right intertrochanteric fracture. -PCCM asked to see for possible thoracentesis -went to surgery 4/11 -developed post op lethargy and required intubation for worsening HCRF post-op.  -extubated 4/13  SUBJECTIVE:  No complaints   VITAL SIGNS: Temp:  [97.9 F (36.6 C)-98.5 F (36.9 C)] 97.9 F (36.6 C) (04/15 0500) Pulse Rate:  [69-77] 69 (04/15 0951) Resp:  [18-32] 18 (04/15 0500) BP: (127-156)/(56-100) 128/62 (04/15 0951) SpO2:  [99 %-100 %] 100 % (04/15 0500)  Intake/Output Summary (Last 24 hours) at 04/29/2017 1500 Last data filed at 04/28/2017 1741 Gross per 24 hour  Intake 60 ml  Output 20 ml  Net 40 ml    PHYSICAL EXAMINATION:   This is a pleasant 84 year old female patient currently sitting up in bed no acute distress. HEENT: Normocephalic atraumatic mucous membranes moist Pulmonary: Diminished bases no accessory use Cardiac: Regular rate and rhythm Abdomen: Soft nontender Extremities: Warm dry no significant edema Neuro: Awake oriented x2 appropriate currently GU clear yellow urine   CBC Recent Labs    04/27/17 0504 04/28/17 0612 04/29/17 0541  WBC 6.8 5.7 6.7  HGB 8.6* 7.9* 9.5*  HCT 29.7* 28.0* 33.8*  PLT 110* 113* 167    Coag's No results for input(s): APTT, INR in the last 72 hours.  BMET Recent Labs    04/27/17 0504 04/28/17 0612 04/29/17 0541  NA 141 141 141  K 3.6 3.8 4.1  CL 103 104 101  CO2 31 31 31   BUN 30* 22* 19  CREATININE 0.67 0.51 0.51  GLUCOSE 149* 76 79    Electrolytes Recent Labs    04/27/17 0504  04/28/17 0612 04/29/17 0541  CALCIUM 8.3* 7.9* 8.5*    Sepsis Markers No results for input(s): PROCALCITON, O2SATVEN in the last 72 hours.  Invalid input(s): LACTICACIDVEN  ABG Recent Labs    04/26/17 1646 04/27/17 0410  PHART 7.464* 7.524*  PCO2ART 47.8 43.6  PO2ART 100.0 133*    Liver Enzymes No results for input(s): AST, ALT, ALKPHOS, BILITOT, ALBUMIN in the last 72 hours.  Cardiac Enzymes Recent Labs    04/26/17 1653 04/26/17 2255  TROPONINI 1.27* 1.06*    Glucose Recent Labs    04/27/17 1149 04/27/17 1513 04/27/17 2019 04/28/17 0022 04/28/17 0415 04/28/17 0758  GLUCAP 136* 113* 80 86 73 74    Imaging No results found.    SIGNIFICANT EVENTS  4/8 Admit  STUDIES:  CXR 4/8 hip >> Comminuted intratrochanteric fracture of the proximal right femur.  CXR 4/9 >> Progressive enlargement of right-sided pleural effusion now moderate to large in size with associated right lung atelectasis.  Mild atelectasis medial aspect left lung base.  Pulmonary vascular congestion.  Cardiomegaly post CABG and valve replacement  ECHO:Echo with preserved EF and severe pulmonary hypertension with estimated RV systolic pressure 75 mmHg. Suspect acute on chronic diastolic heart failure.  Severe pulmonary hypertension.The pulmonary HTN is likely related to her chronic MR as per cardiology.    Impression/Plan  Acute  respiratory failure 2/2 Pulmonary Edema and bilateral pleural effusions d/t acute  on chronic HF and secondary severe pulmonary hypertension  -now seems euvolemic  -PCXR  Plan Cont lasix  Wean oxygen  F/u cxr   PAF. Now NSR Plan Not candidate for long term AC  Acute encephalopathy superimposed on underlying dementia. Multifactorial: medications and hypercarbia -now closer to baseline Plan Supportive care  Limit sedating meds   S/p ORIF of right hip Plan WBAT  PT/OT PT recommending SNF    Simonne Martinet ACNP-BC [facility] Pulmonary/Critical  Care Pager # [phone] OR # [phone] if no answer  Attending Note:  84 year old female that suffered a hip fracture, was taken to the OR and post op developed lethargy and respiratory failure and was intubated.  Patient was then extubated 4/13 and transfered out 4/14.  On exam, lungs are clear.  I reviewed CXR myself, pleural effusion noted.  Discussed with PCCM-NP and TRH-MD.  Acute respiratory failure: resolved  - Monitor closely for airway protection  Hypoxemia:  - Titrate O2 for sat of 88-92%  - Active diureses  Pleural effusion due to CHF  - Diureses as able  Acute encephalopathy due to dementia and medications:  - Minimize sedation  - PT  - OT  Transfer care to [facility] with PCCM off 4/16.  Patient seen and examined, agree with above note.  I dictated the care and orders written for this patient under my direction.  Alyson Reedy, MD [phone]

## 2017-04-29 NOTE — Progress Notes (Signed)
Physical Therapy Treatment Patient Details Name: [name] B Bruski MRN: [id number] DOB: 03/14/1932 Today's Date: 04/29/2017    History of Present Illness 84 y.o. female admitted on 04/22/17 for fall with R femur fx.  Pt s/p IM Nail and is WBAT post op.  Pt with complications of pleural effusion s/p thorcentesis 04/26/17, was still ventilated with ETT at time of PT eval (04/27/17), and A-fib    PT Comments    Pt is making slow progress towards her goals, however is limited by decreased cognition effecting use of novel equipment (RW) with mobility, decreased strength and decreased balance. Pt required maxAx2 for bed mobility, and sit<>stand transfer to RW, and marching in place. Attempted stand pivot transfer to recliner with RW however despite max verbal cuing for sequencing and utilization of UE and weightshift onto L LE to advance R LE. PT recommendations remain appropriate at this time.    Follow Up Recommendations  SNF     Equipment Recommendations  Other (comment)(TBD at next venue)    Recommendations for Other Services       Precautions / Restrictions Precautions Precautions: Fall Restrictions Weight Bearing Restrictions: Yes RLE Weight Bearing: Weight bearing as tolerated    Mobility  Bed Mobility Overal bed mobility: Needs Assistance Bed Mobility: Supine to Sit     Supine to sit: Max assist;+2 for safety/equipment        Transfers Overall transfer level: Needs assistance Equipment used: Rolling walker (2 wheeled);2 person hand held assist Transfers: Stand Pivot Transfers;Sit to/from Stand Sit to Stand: +2 physical assistance Stand pivot transfers: +2 physical assistance       General transfer comment: Pt demonstrating increased confusion with attempts to utilize RW with sit > stand and stand pivot transfer. Ultimately, removed RW and proceeded to pivot with 2 person HHA       Balance Overall balance assessment: Needs assistance Sitting-balance support: Bilateral  upper extremity supported;Feet supported Sitting balance-Leahy Scale: Poor Sitting balance - Comments: required varying levels of assist to maintain balance, eventually able to maintain balance with B UE support   Standing balance support: Bilateral upper extremity supported Standing balance-Leahy Scale: Zero                              Cognition Arousal/Alertness: Awake/alert Behavior During Therapy: Anxious Overall Cognitive Status: No family/caregiver present to determine baseline cognitive functioning                                 General Comments: Pt not oriented to place or situation             Pertinent Vitals/Pain Pain Assessment: No/denies pain           PT Goals (current goals can now be found in the care plan section) Acute Rehab PT Goals PT Goal Formulation: With family Time For Goal Achievement: 05/11/17 Potential to Achieve Goals: Good Progress towards PT goals: Progressing toward goals    Frequency    Min 3X/week      PT Plan Current plan remains appropriate    Co-evaluation PT/OT/SLP Co-Evaluation/Treatment: Yes Reason for Co-Treatment: Complexity of the patient's impairments (multi-system involvement) PT goals addressed during session: Mobility/safety with mobility;Balance;Proper use of DME OT goals addressed during session: ADL's and self-care      AM-PAC PT "6 Clicks" Daily Activity  Outcome Measure  Difficulty turning over in bed (including  adjusting bedclothes, sheets and blankets)?: Unable Difficulty moving from lying on back to sitting on the side of the bed? : Unable Difficulty sitting down on and standing up from a chair with arms (e.g., wheelchair, bedside commode, etc,.)?: Unable Help needed moving to and from a bed to chair (including a wheelchair)?: A Lot Help needed walking in hospital room?: Total Help needed climbing 3-5 steps with a railing? : Total 6 Click Score: 7    End of Session          PT Visit Diagnosis: Muscle weakness (generalized) (M62.81);Difficulty in walking, not elsewhere classified (R26.2);Pain Pain - Right/Left: Right Pain - part of body: Leg     Time: 1610-9604 PT Time Calculation (min) (ACUTE ONLY): 43 min  Charges:  $Therapeutic Activity: 8-22 mins                    G Codes:       Stafford Riviera B. Beverely Risen PT, DPT Acute Rehabilitation  [phone] Pager [phone]     Elon Alas [facility] 04/29/2017, 4:33 PM

## 2017-04-29 NOTE — Consult Note (Signed)
WOC Nurse wound consult note Reason for Consult: Deep Tissue pressure injury to sacral/coccyx area Wound type: DTI Pressure Injury POA: No Measurement: 3.8 cm x 3 cm  Wound bed: Purple Drainage (amount, consistency, odor) no drainage, no odor Periwound: erythematous, but blanches Dressing procedure/placement/frequency: foam dressing; change every 3 days and prn; air mattress; pressure redistribution chair pad when out of bed; turn to right or left every 2 hours-avoid supine position to greatest extent possible. Thank you for the consult.  Discussed plan of care with the patient and bedside nurse.  Helmut MusterSherry Shaundrea Carrigg, RN, MSN, CWOCN, CNS-BC, pager [phone]

## 2017-04-29 NOTE — Progress Notes (Signed)
Progress Note  Patient Name: [name] Date of Encounter: 04/29/2017  Primary Cardiologist:   Olga MillersBrian Crenshaw, MD   Subjective   She is confused but she denies chest pain or SOB. She wants to get out of bed.   Inpatient Medications    Scheduled Meds: . aspirin  81 mg Oral Daily  . atorvastatin  40 mg Oral q1800  . enalapril  20 mg Oral Daily  . enoxaparin (LOVENOX) injection  30 mg Subcutaneous Q24H  . furosemide  40 mg Oral Daily  . mouth rinse  15 mL Mouth Rinse BID  . metoprolol tartrate  50 mg Per Tube BID  . ondansetron (ZOFRAN) IV  4 mg Intravenous Once  . potassium chloride  20 mEq Oral Daily   Continuous Infusions: . phenylephrine (NEO-SYNEPHRINE) Adult infusion Stopped (04/26/17 0500)   PRN Meds: acetaminophen, bisacodyl, docusate, iopamidol, polyethylene glycol   Vital Signs    Vitals:   04/28/17 1653 04/28/17 2058 04/29/17 0500 04/29/17 0951  BP: (!) 145/65 (!) 127/56 (!) 156/64 128/62  Pulse: 77 75 73 69  Resp: (!) 32 19 18   Temp: 98.4 F (36.9 C) 98.5 F (36.9 C) 97.9 F (36.6 C)   TempSrc: Oral Oral Oral   SpO2: 100% 99% 100%   Weight:      Height:        Intake/Output Summary (Last 24 hours) at 04/29/2017 1134 Last data filed at 04/28/2017 1741 Gross per 24 hour  Intake 60 ml  Output 20 ml  Net 40 ml   Filed Weights   04/26/17 0420 04/27/17 0500 04/28/17 0618  Weight: 102 lb 4.7 oz (46.4 kg) 110 lb 10.7 oz (50.2 kg) 109 lb 2 oz (49.5 kg)    Telemetry    NA - Personally Reviewed  ECG    NA - Personally Reviewed  Physical Exam   GEN: No acute distress.   Neck: No  JVD Cardiac: RRR, no murmurs, rubs, or gallops.  Respiratory: Clear  to auscultation bilaterally. GI: Soft, nontender, non-distended  MS: No  edema; No deformity. Neuro:  Nonfocal  Psych: Normal affect but confused.   Labs    Chemistry Recent Labs  Lab 04/23/17 [phone]  04/25/17 0413  04/26/17 0430 04/27/17 0504 04/28/17 0612 04/29/17 0541  NA  --    < >  143   < > 143 141 141 141  K  --    < > 4.2   < > 3.7 3.6 3.8 4.1  CL  --    < > 98*   < > 102 103 104 101  CO2  --    < > 37*   < > 33* 31 31 31   GLUCOSE  --    < > 100*   < > 135* 149* 76 79  BUN  --    < > 16   < > 21* 30* 22* 19  CREATININE  --    < > 0.71   < > 0.80 0.67 0.51 0.51  CALCIUM  --    < > 8.5*   < > 8.7* 8.3* 7.9* 8.5*  PROT 5.0*  --  5.1*  --  4.9*  --   --   --   ALBUMIN 2.5*  --  2.4*  --  2.5*  --   --   --   AST 54*  --  16  --  41  --   --   --   ALT 17  --  12*  --  25  --   --   --   ALKPHOS 35*  --  33*  --  27*  --   --   --   BILITOT 1.9*  --  1.2  --  1.2  --   --   --   GFRNONAA  --    < > >60   < > >60 >60 >60 >60  GFRAA  --    < > >60   < > >60 >60 >60 >60  ANIONGAP  --    < > 8   < > 8 7 6 9    < > = values in this interval not displayed.     Hematology Recent Labs  Lab 04/27/17 0504 04/28/17 0612 04/29/17 0541  WBC 6.8 5.7 6.7  RBC 3.63* 3.33* 3.88  HGB 8.6* 7.9* 9.5*  HCT 29.7* 28.0* 33.8*  MCV 81.8 84.1 87.1  MCH 23.7* 23.7* 24.5*  MCHC 29.0* 28.2* 28.1*  RDW 17.9* 18.3* 19.1*  PLT 110* 113* 167    Cardiac Enzymes Recent Labs  Lab 04/23/17 2032 04/26/17 1302 04/26/17 1653 04/26/17 2255  TROPONINI 0.45* 1.53* 1.27* 1.06*   No results for input(s): TROPIPOC in the last 168 hours.   BNP Recent Labs  Lab 04/22/17 2156  BNP 3,142.4*     DDimer No results for input(s): DDIMER in the last 168 hours.   Radiology    No results found.  Cardiac Studies   Echo 04/23/17: Study Conclusions  - Left ventricle: The cavity size was normal. Wall thickness was normal. Systolic function was normal. The estimated ejection fraction was in the range of 60% to 65%. Images were inadequate for LV wall motion assessment. - Aortic valve: There was trivial regurgitation. - Mitral valve: s/p annuloplasty repair. No significant stenosis. Mild regurgitation. Valve area by continuity equation (using LVOT flow): 1.68 cm^2. - Left  atrium: The atrium was normal in size. - Right ventricle: The cavity size was mildly dilated. Moderately reduced RV systolic function. - Right atrium: The atrium was normal in size. - Tricuspid valve: There was moderate regurgitation. - Pulmonary arteries: PA peak pressure: 75 mm Hg (S). - Inferior vena cava: The vessel was dilated. The respirophasic diameter changes were blunted (<50%), consistent with elevated central venous pressure. - Pericardium, extracardiac: Ascites is noted.   Patient Profile     84 y.o. female s/p CABG, MV repair, chronic diastolic heart failure, severe pulmonary hypertension, and dementia here with hip fracture s/p repair.  Hospital course complicated by respiratory failure and atrial fibrillation with RVR.    Assessment & Plan    PAF:  She is not currently on tele.  In NSR with ectopy on exam.  No change in therapy.  No indication for long term anticoagulation.    ACUTE ON CHRONIC DIASTOLIC HF:  Seems to be euvolemic.  No change in therapy.   PULMONARY HTN:  Continue current therapy.  No further work up is planned.   ELEVATED TROPONIN:  No ischemia work up planned.  We will follow as needed.    For questions or updates, please contact [facility] Please consult [email or web address] for contact info under Cardiology/STEMI.   Signed, Rollene Rotunda, MD  04/29/2017, 11:34 AM

## 2017-04-29 NOTE — Social Work (Signed)
CSW following pt now that she has transferred to 5W, aware SNF still recommended.  Doy HutchingIsabel H Malakie Balis, LCSWA [facility] Clinical Social Work [phone]

## 2017-04-29 NOTE — Progress Notes (Signed)
Occupational Therapy Treatment Patient Details Name: [name] MRN: [id number] DOB: 05/27/1932 Today's Date: 04/29/2017    History of present illness 84 y.o. female admitted on 04/22/17 for fall with R femur fx.  Pt s/p IM Nail and is WBAT post op.  Pt with complications of pleural effusion s/p thorcentesis 04/26/17, was still ventilated with ETT at time of PT eval (04/27/17), and A-fib   OT comments  Pt is making progress towards goals.  Pt more alert and participatory this session.  Required +2 for mobility due to difficulty following one step commands and decreased orientation.  Bed mobility with increased time and cues for sequencing and +2 for sit > stand, marching in place, and stand pivot transfer to recliner.  Pt demonstrating increased difficulty weight bearing through RLE and stepping forward with RLE.  Pt demonstrating increased confusion/difficulty processing cues when provided with increased stimulus.    Follow Up Recommendations  SNF    Equipment Recommendations  Other (comment)(TBD at next venue)    Recommendations for Other Services      Precautions / Restrictions Precautions Precautions: Fall Restrictions Weight Bearing Restrictions: Yes RLE Weight Bearing: Weight bearing as tolerated       Mobility Bed Mobility Overal bed mobility: Needs Assistance Bed Mobility: Supine to Sit     Supine to sit: Max assist;+2 for safety/equipment        Transfers Overall transfer level: Needs assistance Equipment used: Rolling walker (2 wheeled) Transfers: Pharmacologisttand Pivot Transfers;Sit to/from Stand Sit to Stand: +2 physical assistance Stand pivot transfers: +2 physical assistance       General transfer comment: Pt demonstrating increased confusion with attempts to utilize RW with sit > stand and stand pivot transfer.          ADL either performed or assessed with clinical judgement   ADL Overall ADL's : Needs assistance/impaired                          Toilet Transfer: +2 for physical assistance;Stand-pivot;RW Toilet Transfer Details (indicate cue type and reason): simulated to recliner         Functional mobility during ADLs: +2 for physical assistance General ADL Comments: Pt requiring total A for all ADLs and ROM at bed level. Pt with difficulty following commands.  Increased confusion when attempting to utilize RW during transfer.               Cognition Arousal/Alertness: Awake/alert Behavior During Therapy: Anxious Overall Cognitive Status: No family/caregiver present to determine baseline cognitive functioning                                 General Comments: Pt not oriented to place or situation                   Pertinent Vitals/ Pain       Pain Assessment: No/denies pain         Frequency  Min 2X/week        Progress Toward Goals  OT Goals(current goals can now be found in the care plan section)  Progress towards OT goals: Progressing toward goals     Plan Discharge plan remains appropriate    Co-evaluation    PT/OT/SLP Co-Evaluation/Treatment: Yes Reason for Co-Treatment: Complexity of the patient's impairments (multi-system involvement)   OT goals addressed during session: ADL's and self-care      AM-PAC  PT "6 Clicks" Daily Activity     Outcome Measure   Help from another person eating meals?: A Lot Help from another person taking care of personal grooming?: A Lot Help from another person toileting, which includes using toliet, bedpan, or urinal?: Total Help from another person bathing (including washing, rinsing, drying)?: Total Help from another person to put on and taking off regular upper body clothing?: Total Help from another person to put on and taking off regular lower body clothing?: Total 6 Click Score: 8    End of Session Equipment Utilized During Treatment: Gait belt;Rolling walker  OT Visit Diagnosis: Unsteadiness on feet (R26.81);Other abnormalities  of gait and mobility (R26.89);Muscle weakness (generalized) (M62.81);Other symptoms and signs involving cognitive function;Pain   Activity Tolerance Patient limited by lethargy   Patient Left in chair;with call bell/phone within reach;with chair alarm set   Nurse Communication Mobility status        Time: 1410-1445 OT Time Calculation (min): 35 min  Charges: OT General Charges $OT Visit: 1 Visit OT Treatments $Therapeutic Activity: 8-22 mins   Rosalio Loud, [phone] 04/29/2017, 4:14 PM

## 2017-04-30 ENCOUNTER — Inpatient Hospital Stay (HOSPITAL_COMMUNITY): Payer: Medicare Other

## 2017-04-30 MED ORDER — ENSURE ENLIVE PO LIQD
237.0000 mL | Freq: Two times a day (BID) | ORAL | Status: DC
  Administered 2017-05-01 – 2017-05-06 (×10): 237 mL via ORAL

## 2017-04-30 NOTE — Progress Notes (Signed)
[facility]                                                                              Patient Demographics  [name], is a 84 y.o. female, DOB - June 27, 1932, ZOX:[id number]  Admit date - 04/22/2017   Admitting Physician Melissa Blue, MD  Outpatient Primary MD for the patient is [name].  Outpatient specialists:   LOS - 8  days   Medical records reviewed and are as summarized below:    Chief Complaint  Patient presents with  . Fall  . Hypertension       Brief summary   Patient is a 84 year old female with history of diastolic CHF, CAD status post MVR, CABG 2008, AAA, hypertension, hyperlipidemia, possible dementia was admitted on 4/8 with a right intertrochanteric fracture after mechanical fall.  Patient underwent ORIF surgery on 4/11, developed postop lethargy and required intubation for worsening hypoxic respiratory failure, extubated on 4/13. Transfer to [facility] hospitalist service on 04/30/17  Assessment & Plan    Principal Problem:   Closed displaced intertrochanteric fracture of right femur (HCC) -Status post mechanical fall, ORIF on 4/11 -Out of ICU, start PT, will need skilled nursing facility  Active Problems: Acute respiratory failure with hypoxia with pulmonary edema, bilateral pleural effusion -Postop, developed lethargy, respiratory failure and was intubated.  Patient was extubated on 4/13. -Currently O2 sats improving 95% on 2 L -On diuresis negative balance of 1.76 L, continue Lasix 40 mg daily  Bilateral pleural effusions, acute on chronic diastolic CHF, severe pulmonary hypertension -Patient denies any chest pain or shortness of breath, cardiology following -Negative balance of 1.76 L -Chest x-ray 4/16 showed increasing layering bilateral effusions, bilateral perihilar and lower lobe opacities, reflect edema/CHF with superimposed areas of bibasilar atelectasis -Underwent ultrasound-guided right thoracentesis on  4/12with 1 L of pleural fluid removed, fluid studies consistent with transudative pleural effusion  Elevated troponin -Likely due to acute respiratory failure and acute on chronic diastolic CHF - Cardiology following Melissa ischemia workup planned.  Acute encephalopathy superimposed on underlying dementia -Possibly worsened due to medications, hypercarbia, Sundowning -Improving, continue supportive care, limit sedating meds  Pulmonary hypertension -Continue current therapy, Melissa further workup planned per cardiology  Essential hypertension -BP currently stable, continue beta-blocker, enalapril  Normocytic anemia -Postop hemoglobin down to 7.5, likely due to blood loss anemia, status post transfusion,  - now improved 9.5  Moderate malnutrition -Nutrition consult  Pressure injury of the skin -Per nursing flow sheet, right shoulder abrasion, foam dressing  Code Status: Full CODE STATUS DVT Prophylaxis: Lovenox Family Communication: Discussed in detail with the patient, all imaging results, lab results explained to the patient.  Melissa family member at the bedside   Disposition Plan: When skilled nursing facility bed available, hopefully next 24 hours  Time Spent in minutes 35 minutes   Procedures:  ORIF 4/11 Intubation  Consultants:   Orthopedics P CCM Cardiology  Antimicrobials:      Medications  Scheduled Meds: . aspirin  81 mg Oral Daily  . atorvastatin  40 mg Oral q1800  . enalapril  20 mg Oral Daily  . enoxaparin (  LOVENOX) injection  30 mg Subcutaneous Q24H  . furosemide  40 mg Oral Daily  . mouth rinse  15 mL Mouth Rinse BID  . metoprolol tartrate  50 mg Per Tube BID  . ondansetron (ZOFRAN) IV  4 mg Intravenous Once  . potassium chloride  20 mEq Oral Daily   Continuous Infusions: . phenylephrine (NEO-SYNEPHRINE) Adult infusion Stopped (04/26/17 0500)   PRN Meds:.acetaminophen, bisacodyl, docusate, iopamidol, polyethylene glycol   Antibiotics    Anti-infectives (From admission, onward)   Start     Dose/Rate Route Frequency Ordered Stop   04/25/17 2045  ceFAZolin (ANCEF) IVPB 2g/100 mL premix     2 g 200 mL/hr over 30 Minutes Intravenous Every 6 hours 04/25/17 2039 04/26/17 0329   04/25/17 1255  ceFAZolin (ANCEF) 2-4 GM/100ML-% IVPB    Note to Pharmacy:  Sabino NiemannHogue, Samantha   : cabinet override      04/25/17 1255 04/26/17 0059   04/23/17 0600  ceFAZolin (ANCEF) IVPB 2g/100 mL premix     2 g 200 mL/hr over 30 Minutes Intravenous On call to O.R. 04/23/17 0000 04/24/17 0559        Subjective:   [name] was seen and examined today.  Alert and awake, oriented x2, still somewhat appears to be confused.  Pleasant and cooperative.  Remembers that [facility] had a fall and [name]  Patient denies dizziness, chest pain, shortness of breath, abdominal pain, N/V/D/C, new weakness, numbess, tingling. Melissa acute events overnight.    Objective:   Vitals:   04/29/17 2316 04/30/17 0300 04/30/17 0553 04/30/17 0844  BP: (!) 83/58  135/71 (!) 148/42  Pulse:   70 70  Resp:   20   Temp:   (!) 97 F (36.1 C)   TempSrc:   Axillary   SpO2:   100% 95%  Weight:  46.3 kg (102 lb)    Height:        Intake/Output Summary (Last 24 hours) at 04/30/2017 1032 Last data filed at 04/30/2017 0900 Gross per 24 hour  Intake -  Output 1720 ml  Net -1720 ml     Wt Readings from Last 3 Encounters:  04/30/17 46.3 kg (102 lb)  01/26/15 54.6 kg (120 lb 6.4 oz)  09/30/13 61.7 kg (136 lb)     Exam  General: Alert and oriented x 2 NAD  Eyes: PERRLA, EOMI  HEENT:  Atraumatic, normocephalic  Cardiovascular: S1 S2 auscultated,. Regular rate and rhythm.  Respiratory: Bibasilar crackles  Gastrointestinal: Soft, nontender, nondistended, + bowel sounds  Ext: Melissa pedal edema bilaterally  Neuro: Melissa new deficits  Musculoskeletal: Melissa digital cyanosis, clubbing  Skin: Melissa rashes  Psych: pleasant and cooperative, alert and awake,  oriented x2   Data Reviewed:  I have personally reviewed following labs and imaging studies  Micro Results Recent Results (from the past 240 hour(s))  Surgical pcr screen     Status: None   Collection Time: 04/23/17  1:07 AM  Result Value Ref Range Status   MRSA, PCR NEGATIVE NEGATIVE Final   Staphylococcus aureus NEGATIVE NEGATIVE Final    Comment: (NOTE) The Xpert SA Assay (FDA approved for NASAL specimens in patients 82 years of age and older), is one component of a comprehensive surveillance program. It is not intended to diagnose infection nor to guide or monitor treatment. Performed at [facility], [address] [place], [place] [address]   Urine Culture     Status: None   Collection Time:  04/23/17  8:37 AM  Result Value Ref Range Status   Specimen Description URINE, RANDOM  Final   Special Requests NONE  Final   Culture   Final    Melissa GROWTH Performed at [facility], [address], [place], [place] [address]    Report Status 04/24/2017 FINAL  Final  Culture, body fluid-bottle     Status: None (Preliminary result)   Collection Time: 04/26/17  3:30 PM  Result Value Ref Range Status   Specimen Description PLEURAL RIGHT  Final   Special Requests NONE  Final   Culture   Final    Melissa GROWTH 3 DAYS Performed at [facility], [address], [place], [place] [address]    Report Status PENDING  Incomplete  Gram stain     Status: None   Collection Time: 04/26/17  3:30 PM  Result Value Ref Range Status   Specimen Description PLEURAL RIGHT  Final   Special Requests NONE  Final   Gram Stain   Final    FEW WBC PRESENT,BOTH PMN AND MONONUCLEAR Melissa ORGANISMS SEEN Performed at [facility], [address], [place], [place] [address]    Report Status 04/26/2017 FINAL  Final    Radiology Reports Dg Chest 1 View  Result Date: 04/22/2017 CLINICAL DATA:  Preop evaluation for right hip surgery EXAM: CHEST  1 VIEW COMPARISON:  04/05/2008 FINDINGS:  Cardiac shadow remains enlarged. Postsurgical changes are again seen. Right-sided pleural effusion and atelectatic/infiltrative changes are noted. Melissa pneumothorax is seen. Melissa acute bony abnormality is noted. IMPRESSION: Right basilar atelectatic/infiltrative changes with associated effusion. Electronically Signed   By: Alcide Clever M.D.   On: 04/22/2017 11:56   Dg Chest Port 1 View  Result Date: 04/30/2017 CLINICAL DATA:  Pleural effusion, shortness of breath EXAM: PORTABLE CHEST 1 VIEW COMPARISON:  04/26/2017 FINDINGS: Prior CABG. Cardiomegaly. Layering bilateral effusions have increased since prior study. Increasing perihilar and lower lobe opacities and interstitial prominence, likely edema and superimposed areas of atelectasis. IMPRESSION: Increasing layering bilateral effusions, bilateral perihilar and lower lobe opacities and interstitial prominence. Findings likely reflect edema/CHF and superimposed areas of bibasilar atelectasis. Electronically Signed   By: Charlett Nose M.D.   On: 04/30/2017 07:55   Dg Chest Port 1 View  Result Date: 04/26/2017 CLINICAL DATA:  Post thoracentesis EXAM: PORTABLE CHEST 1 VIEW COMPARISON:  04/26/2017, 04/25/2017 FINDINGS: Support devices obscure the upper chest. Endotracheal tube tip is about 2.7 cm superior to the carina. Esophageal tube tip is below the diaphragm but non included. Post sternotomy changes and valve prosthesis. Decreased right greater than left pleural effusions. Melissa definitive right pneumothorax. Improved aeration of the right lower lung. Residual consolidation at the right middle lobe and right base. Mediastinal silhouette is exaggerated by patient rotation. Dense aortic atherosclerosis. IMPRESSION: 1. Decreased small bilateral right greater than left pleural effusions. Melissa definitive right pneumothorax seen 2. Persistent airspace disease at the right middle lobe and right base, atelectasis versus pneumonia. Electronically Signed   By: Jasmine Pang M.D.    On: 04/26/2017 15:45   Portable Chest Xray  Result Date: 04/26/2017 CLINICAL DATA:  Endotracheal tube EXAM: PORTABLE CHEST 1 VIEW COMPARISON:  04/25/2017 FINDINGS: Cardiac shadow is stable. Postsurgical changes are again seen. Endotracheal tube and nasogastric catheter are noted in satisfactory position. Bilateral pleural effusions are again seen right greater than left. Underlying atelectasis is present. IMPRESSION: Stable bilateral pleural effusions and atelectasis. Electronically Signed   By: Alcide Clever  M.D.   On: 04/26/2017 07:55   Dg Chest Port 1 View  Result Date: 04/25/2017 CLINICAL DATA:  Endotracheal tube present.  OG tube placement. EXAM: PORTABLE CHEST 1 VIEW COMPARISON:  Radiograph earlier this day at 0617 hour FINDINGS: Endotracheal tube 2.2 cm from the carina. Enteric tube in place with tip and side-port below the diaphragm in the stomach. Post median sternotomy. Unchanged cardiomegaly and mediastinal contours. Aortic atherosclerosis. Unchanged right pleural effusion and basilar airspace disease. Improved left pleural effusion. Improving bilateral airspace disease. Melissa pneumothorax. The bones are under mineralized. IMPRESSION: 1. Endotracheal tube tip 2.2 cm from the carina. Tip and side port of the enteric tube below the diaphragm. 2. Bilateral pleural effusions, moderate on the right and small on the left, with improvement from exam earlier this day. 3. Improving aeration with improvement in bilateral airspace disease. Electronically Signed   By: Rubye Oaks M.D.   On: 04/25/2017 21:47   Dg Chest Port 1 View  Result Date: 04/25/2017 CLINICAL DATA:  Shortness of Breath EXAM: PORTABLE CHEST 1 VIEW COMPARISON:  04/23/2017 FINDINGS: Changes of median sternotomy and valve replacement. Moderate to large right effusion is similar or slightly improved since prior study. Diffuse right lung airspace disease. Small to moderate left effusion, new since prior study with left lower lobe  atelectasis or infiltrate. IMPRESSION: Moderate right effusion, slightly improved since prior study. New small to moderate left effusion. Bilateral airspace disease which could reflect atelectasis or pneumonia, right greater than left. Electronically Signed   By: Charlett Nose M.D.   On: 04/25/2017 08:59   Dg Chest Port 1 View  Result Date: 04/23/2017 CLINICAL DATA:  84 year old female with hypoxia. Subsequent encounter. EXAM: PORTABLE CHEST 1 VIEW COMPARISON:  04/22/2017 chest x-ray. FINDINGS: Post CABG and valve replacement.  Cardiomegaly. Progressive enlargement of right-sided pleural effusion now moderate to large in size with associated right lung atelectasis. Pulmonary vascular congestion. Medial left base subsegmental atelectasis. Calcified tortuous aorta.  Prominent vascular calcifications. IMPRESSION: Progressive enlargement of right-sided pleural effusion now moderate to large in size with associated right lung atelectasis. Mild atelectasis medial aspect left lung base. Pulmonary vascular congestion. Cardiomegaly post CABG and valve replacement. Aortic Atherosclerosis (ICD10-I70.0). Electronically Signed   By: Lacy Duverney M.D.   On: 04/23/2017 08:45   Dg C-arm 1-60 Min  Result Date: 04/25/2017 CLINICAL DATA:  Right intertrochanteric femur fracture ORIF. EXAM: DG C-ARM 61-120 MIN; RIGHT FEMUR 2 VIEWS COMPARISON:  Right hip x-rays dated April 22, 2017. FINDINGS: Intraoperative x-rays demonstrate interval placement of a right femoral cephalomedullary rod with distal interlocking screw. Near anatomic alignment of the dominant intertrochanteric fracture fragments. The lesser trochanter remains mildly displaced medially. Right hip osteoarthritis. Vascular calcifications. IMPRESSION: 1. Improved alignment of the right intertrochanteric femur fracture status post ORIF. FLUOROSCOPY TIME:  1 minutes, 37 seconds. C-arm fluoroscopic images were obtained intraoperatively and submitted for post operative  interpretation. Electronically Signed   By: Obie Dredge M.D.   On: 04/25/2017 15:46   Dg Hip Unilat W Or Wo Pelvis 2-3 Views Right  Result Date: 04/22/2017 CLINICAL DATA:  Recent fall with right leg deformity EXAM: DG HIP (WITH OR WITHOUT PELVIS) 2-3V RIGHT COMPARISON:  None. FINDINGS: Pelvic ring is intact. Comminuted intratrochanteric fracture of the proximal right femur is noted with impaction and angulation at the fracture site. Melissa soft tissue changes are seen. Diffuse vascular calcifications are noted. IMPRESSION: Comminuted intratrochanteric fracture of the proximal right femur. Electronically Signed   By: Eulah Pont.D.  On: 04/22/2017 12:01   Dg Femur, Min 2 Views Right  Result Date: 04/25/2017 CLINICAL DATA:  Right intertrochanteric femur fracture ORIF. EXAM: DG C-ARM 61-120 MIN; RIGHT FEMUR 2 VIEWS COMPARISON:  Right hip x-rays dated April 22, 2017. FINDINGS: Intraoperative x-rays demonstrate interval placement of a right femoral cephalomedullary rod with distal interlocking screw. Near anatomic alignment of the dominant intertrochanteric fracture fragments. The lesser trochanter remains mildly displaced medially. Right hip osteoarthritis. Vascular calcifications. IMPRESSION: 1. Improved alignment of the right intertrochanteric femur fracture status post ORIF. FLUOROSCOPY TIME:  1 minutes, 37 seconds. C-arm fluoroscopic images were obtained intraoperatively and submitted for post operative interpretation. Electronically Signed   By: Obie Dredge M.D.   On: 04/25/2017 15:46   US Thoracentesis Asp Pleural Space W/img Guide  Result Date: 04/26/2017 INDICATION: Shortness of breath. Respiratory failure. Right-sided pleural effusion. Request diagnostic and therapeutic thoracentesis. EXAM: ULTRASOUND GUIDED RIGHT THORACENTESIS MEDICATIONS: None. COMPLICATIONS: None immediate. PROCEDURE: An ultrasound guided thoracentesis was thoroughly discussed with the patient and questions answered. The  benefits, risks, alternatives and complications were also discussed. The patient understands and wishes to proceed with the procedure. Written consent was obtained. Ultrasound was performed to localize and mark an adequate pocket of fluid in the right chest. The area was then prepped and draped in the normal sterile fashion. 1% Lidocaine was used for local anesthesia. Under ultrasound guidance a Safe-T-Centesis catheter was introduced. Thoracentesis was performed. The catheter was removed and a dressing applied. FINDINGS: A total of approximately 1 L of clear yellow fluid was removed. Samples were sent to the laboratory as requested by the clinical team. IMPRESSION: Successful ultrasound guided right thoracentesis yielding 1 L of pleural fluid. Read by: Brayton El PA-C Electronically Signed   By: Gilmer Mor D.O.   On: 04/26/2017 15:47    Lab Data:  CBC: Recent Labs  Lab 04/25/17 0413 04/25/17 1940  04/26/17 0530 04/26/17 1430 04/27/17 0504 04/28/17 0612 04/29/17 0541  WBC 5.6 7.9  --  4.6  --  6.8 5.7 6.7  NEUTROABS 4.4  --   --  3.8  --  5.7 4.6 5.4  HGB 9.0* 8.4*   < > 7.5* 9.4* 8.6* 7.9* 9.5*  HCT 34.2* 31.8*   < > 27.2* 32.6* 29.7* 28.0* 33.8*  MCV 85.3 85.7  --  83.2  --  81.8 84.1 87.1  PLT 144* 172  --  101*  --  110* 113* 167   < > = values in this interval not displayed.   Basic Metabolic Panel: Recent Labs  Lab 04/24/17 0529 04/25/17 0413 04/25/17 1940 04/25/17 2004 04/26/17 0430 04/27/17 0504 04/28/17 0612 04/29/17 0541  NA 142 143 140 142 143 141 141 141  K 4.9 4.2 5.0 4.8 3.7 3.6 3.8 4.1  CL 100* 98* 101  --  102 103 104 101  CO2 33* 37* 27  --  33* 31 31 31   GLUCOSE 83 100* 162*  --  135* 149* 76 79  BUN 20 16 19   --  21* 30* 22* 19  CREATININE 0.84 0.71 0.78  --  0.80 0.67 0.51 0.51  CALCIUM 8.7* 8.5* 8.5*  --  8.7* 8.3* 7.9* 8.5*  MG 2.0 1.7  --   --  1.6*  --   --   --   PHOS  --  2.4*  --   --  1.6*  --   --   --    GFR: Estimated Creatinine  Clearance: 39.6 mL/min (by C-G  formula based on SCr of 0.51 mg/dL). Liver Function Tests: Recent Labs  Lab 04/25/17 0413 04/26/17 0430  AST 16 41  ALT 12* 25  ALKPHOS 33* 27*  BILITOT 1.2 1.2  PROT 5.1* 4.9*  ALBUMIN 2.4* 2.5*   Melissa results for input(s): LIPASE, AMYLASE in the last 168 hours. Melissa results for input(s): AMMONIA in the last 168 hours. Coagulation Profile: Melissa results for input(s): INR, PROTIME in the last 168 hours. Cardiac Enzymes: Recent Labs  Lab 04/23/17 1453 04/23/17 2032 04/26/17 1302 04/26/17 1653 04/26/17 2255  TROPONINI 0.42* 0.45* 1.53* 1.27* 1.06*   BNP (last 3 results) Melissa results for input(s): PROBNP in the last 8760 hours. HbA1C: Melissa results for input(s): HGBA1C in the last 72 hours. CBG: Recent Labs  Lab 04/27/17 1513 04/27/17 2019 04/28/17 0022 04/28/17 0415 04/28/17 0758  GLUCAP 113* 80 86 73 74   Lipid Profile: Melissa results for input(s): CHOL, HDL, LDLCALC, TRIG, CHOLHDL, LDLDIRECT in the last 72 hours. Thyroid Function Tests: Melissa results for input(s): TSH, T4TOTAL, FREET4, T3FREE, THYROIDAB in the last 72 hours. Anemia Panel: Melissa results for input(s): VITAMINB12, FOLATE, FERRITIN, TIBC, IRON, RETICCTPCT in the last 72 hours. Urine analysis:    Component Value Date/Time   COLORURINE YELLOW 04/25/2017 2229   APPEARANCEUR CLEAR 04/25/2017 2229   LABSPEC 1.010 04/25/2017 2229   PHURINE 5.0 04/25/2017 2229   GLUCOSEU NEGATIVE 04/25/2017 2229   HGBUR SMALL (A) 04/25/2017 2229   BILIRUBINUR NEGATIVE 04/25/2017 2229   KETONESUR 5 (A) 04/25/2017 2229   PROTEINUR NEGATIVE 04/25/2017 2229   UROBILINOGEN 0.2 04/05/2008 0629   NITRITE NEGATIVE 04/25/2017 2229   LEUKOCYTESUR NEGATIVE 04/25/2017 2229     Erza Mothershead M.D. [facility] 04/30/2017, 10:32 AM  Pager: [phone] Between 7am to 7pm - call Pager - [phone]  After 7pm go to [email or web address] - password [email or web address]  Call night coverage person covering after 7pm

## 2017-04-30 NOTE — Progress Notes (Signed)
[facility] physician Dr. Kathy Breachakesh paged and notified that BP in Right arm 83/58, BP in Left arm 159/74.  Previous BP's have been taken in left arm. Per MD it is ok to give HS metoprolol and use left arm to obtain BP.

## 2017-04-30 NOTE — Progress Notes (Signed)
Nutrition Follow-up  DOCUMENTATION CODES:   Non-severe (moderate) malnutrition in context of chronic illness  INTERVENTION:  Ensure Enlive po BID, each supplement provides 350 kcal and 20 grams of protein  NUTRITION DIAGNOSIS:   Moderate Malnutrition related to chronic illness(dementia) as evidenced by energy intake < 75% for > or equal to 1 month, mild fat depletion, moderate fat depletion, mild muscle depletion, moderate muscle depletion. -ongoing  GOAL:   Patient will meet greater than or equal to 90% of their needs -progressing   MONITOR:   PO intake, Supplement acceptance, Labs  ASSESSMENT:   84 year old female with PMH significant for but not limited to former smoker, diastolic HF, CAD s/p MVR and CABG 2008, AAA, HTN, HLD, PVD and questionable dementia who was admitted to [facility] on 4/8 with right intertrochanteric fracture.  4/11 - ORIF 4/12 - Thoracentesis - 1L pleural fluid removed  Ms. Marita KansasVernon was resting comfortably today. Ate most of chicken and dumplings today. No current complaints. Encephalopathy seems to have improved.  Labs reviewed:  Phos 1.6, Mg 1.6??  Medications reviewed and include:  20 K+  Diet Order:  Diet Heart Room service appropriate? Yes; Fluid consistency: Thin  EDUCATION NEEDS:   Education needs have been addressed  Skin:  Skin Assessment: Skin Integrity Issues: Skin Integrity Issues:: Incisions, DTI, Stage II DTI: sacrum Stage II: sacrum Incisions: rt knee  Last BM:  04/22/17  Height:   Ht Readings from Last 1 Encounters:  04/28/17 5\' 2"  (1.575 m)    Weight:   Wt Readings from Last 1 Encounters:  04/30/17 102 lb (46.3 kg)    Ideal Body Weight:  50 kg  BMI:  Body mass index is 18.66 kg/m.  Estimated Nutritional Needs:   Kcal:  1250-1500 calories  Protein:  60-75 grams  Fluid:  > 1.1 L  Dionne AnoWilliam M. Kaysen Deal, MS, RD LDN Inpatient Clinical Dietitian Pager [phone]

## 2017-05-01 ENCOUNTER — Inpatient Hospital Stay (HOSPITAL_COMMUNITY): Payer: Medicare Other

## 2017-05-01 LAB — CBC
HEMATOCRIT: 34 % — AB (ref 36.0–46.0)
Hemoglobin: 9.3 g/dL — ABNORMAL LOW (ref 12.0–15.0)
MCH: 24.7 pg — ABNORMAL LOW (ref 26.0–34.0)
MCHC: 27.4 g/dL — ABNORMAL LOW (ref 30.0–36.0)
MCV: 90.2 fL (ref 78.0–100.0)
PLATELETS: 180 10*3/uL (ref 150–400)
RBC: 3.77 MIL/uL — ABNORMAL LOW (ref 3.87–5.11)
RDW: 19.8 % — AB (ref 11.5–15.5)
WBC: 6.3 10*3/uL (ref 4.0–10.5)

## 2017-05-01 LAB — CULTURE, BODY FLUID W GRAM STAIN -BOTTLE: Culture: NO GROWTH

## 2017-05-01 LAB — BASIC METABOLIC PANEL
Anion gap: 5 (ref 5–15)
BUN: 16 mg/dL (ref 6–20)
CO2: 38 mmol/L — ABNORMAL HIGH (ref 22–32)
CREATININE: 0.58 mg/dL (ref 0.44–1.00)
Calcium: 8.6 mg/dL — ABNORMAL LOW (ref 8.9–10.3)
Chloride: 98 mmol/L — ABNORMAL LOW (ref 101–111)
GFR calc Af Amer: >60 mL/min (ref >60)
GFR calc non Af Amer: >60 mL/min (ref >60)
Glucose, Bld: 128 mg/dL — ABNORMAL HIGH (ref 65–99)
POTASSIUM: 3.7 mmol/L (ref 3.5–5.1)
SODIUM: 141 mmol/L (ref 135–145)

## 2017-05-01 LAB — BRAIN NATRIURETIC PEPTIDE: B Natriuretic Peptide: 2446.5 pg/mL — ABNORMAL HIGH (ref 0.0–100.0)

## 2017-05-01 LAB — CULTURE, BODY FLUID-BOTTLE

## 2017-05-01 MED ORDER — FUROSEMIDE 10 MG/ML IJ SOLN
20.0000 mg | Freq: Once | INTRAMUSCULAR | Status: AC
  Administered 2017-05-01: 20 mg via INTRAVENOUS
  Filled 2017-05-01: qty 2

## 2017-05-01 MED ORDER — ENSURE ENLIVE PO LIQD: 237.0000 mL | Freq: Two times a day (BID) | ORAL | 12 refills | Status: AC

## 2017-05-01 MED ORDER — METOPROLOL TARTRATE 50 MG PO TABS: 50.0000 mg | ORAL_TABLET | Freq: Two times a day (BID) | ORAL | Status: AC

## 2017-05-01 MED ORDER — POLYETHYLENE GLYCOL 3350 17 G PO PACK: 17.0000 g | PACK | Freq: Every day | ORAL | 0 refills | Status: AC | PRN

## 2017-05-01 MED ORDER — FUROSEMIDE 10 MG/ML IJ SOLN
40.0000 mg | Freq: Every day | INTRAMUSCULAR | Status: DC
  Administered 2017-05-02: 40 mg via INTRAVENOUS
  Filled 2017-05-01 (×2): qty 4

## 2017-05-01 NOTE — Progress Notes (Signed)
Informed Dr. Isidoro Donningai that patient respiration rate is 46 and patient complaining of SOB and sating 98 on 3L [place]. MD to place orders for IV lasix and chest x-ray.

## 2017-05-01 NOTE — Progress Notes (Signed)
Per Dr. Isidoro Donningai bladder scan patient in four hours and if greater than 200 cc in bladder place foley cath.

## 2017-05-01 NOTE — Progress Notes (Addendum)
[facility]                                                                              Patient Demographics  [name], is a 84 y.o. female, DOB - [birth year], ZOX:[id number]  Admit date - 04/22/2017   Admitting Physician Jonah Blue, MD  Outpatient Primary MD for the patient is No primary care provider on file.  Outpatient specialists:   LOS - 9  days   Medical records reviewed and are as summarized below:    Chief Complaint  Patient presents with  . Fall  . Hypertension       Brief summary   Patient is a 84 year old female with history of diastolic CHF, CAD status post MVR, CABG 2008, AAA, hypertension, hyperlipidemia, possible dementia was admitted on 4/8 with a right intertrochanteric fracture after mechanical fall.  Patient underwent ORIF surgery on 4/11, developed postop lethargy and required intubation for worsening hypoxic respiratory failure, extubated on 4/13. Transfer to [facility] hospitalist service on 04/30/17  Assessment & Plan    Principal Problem:   Closed displaced intertrochanteric fracture of right femur (HCC) -Status post mechanical fall, ORIF on 4/11 -Out of ICU, start PT, will need skilled nursing facility  Active Problems: Acute respiratory failure with hypoxia with pulmonary edema, bilateral pleural effusion -Postop, developed lethargy, respiratory failure and was intubated.  Patient was extubated on 4/13. -Sats 100% on 2.5 L however patient had on diuresis, however patient had tachypnea chest x-ray showed bilateral pleural effusions.  Give extra Lasix 20 mg IV x1, patient has received oral Lasix dose in a.m., will change to Lasix 40 mg IV daily -Negative balance of 1.8 L, will reassess -Stat chest x-ray showed bilateral pleural effusions, Rt >Lt, awaiting BNP Addendum: 1:10pm - Reevaluated patient, sob better, still has bibasilar crackles on lung exam, BNP 2446.5, will give another lasix 20mg  IV x 1 D/w RN    Bilateral pleural  effusions, acute on chronic diastolic CHF, severe pulmonary hypertension -Patient denies any chest pain or shortness of breath, cardiology following -Chest x-ray 4/17 shows bilateral pleural effusions -Underwent ultrasound-guided right thoracentesis on 4/12 with 1 L of pleural fluid removed, fluid studies consistent with transudative pleural effusion  Elevated troponin -Likely due to acute respiratory failure and acute on chronic diastolic CHF - Cardiology following, no ischemia workup planned.  Acute encephalopathy superimposed on underlying dementia -Possibly worsened due to medications, hypercarbia, Sundowning -Improving, continue supportive care, limit sedating meds  Pulmonary hypertension -Continue current therapy, no further workup planned per cardiology  Essential hypertension -BP currently stable, continue beta-blocker, enalapril, Lasix  Normocytic anemia -Postop hemoglobin down to 7.5, likely due to blood loss anemia, status post transfusion,  - now improved 9.5  Moderate malnutrition -Nutrition consult  Pressure injury of the skin -Per nursing flow sheet, right shoulder abrasion, foam dressing  Urinary retention Bladder ultrasound> 500cc, in & out cath, if repeat bladder ultrasound in 4 hours shows urinary retention will Place Foley cath  Code Status: Full CODE STATUS DVT Prophylaxis: Lovenox Family Communication: Discussed in detail with the patient, all imaging results, lab results explained to the patient's son  Disposition Plan: Skilled nursing facility  Time Spent in minutes 35 minutes   Procedures:  ORIF 4/11 Intubation  Consultants:   Orthopedics P CCM Cardiology  Antimicrobials:      Medications  Scheduled Meds: . aspirin  81 mg Oral Daily  . atorvastatin  40 mg Oral q1800  . enalapril  20 mg Oral Daily  . enoxaparin (LOVENOX) injection  30 mg Subcutaneous Q24H  . feeding supplement (ENSURE ENLIVE)  237 mL Oral BID BM  . furosemide   40 mg Oral Daily  . mouth rinse  15 mL Mouth Rinse BID  . metoprolol tartrate  50 mg Per Tube BID  . ondansetron (ZOFRAN) IV  4 mg Intravenous Once  . potassium chloride  20 mEq Oral Daily   Continuous Infusions:  PRN Meds:.acetaminophen, bisacodyl, docusate, iopamidol, polyethylene glycol   Antibiotics   Anti-infectives (From admission, onward)   Start     Dose/Rate Route Frequency Ordered Stop   04/25/17 2045  ceFAZolin (ANCEF) IVPB 2g/100 mL premix     2 g 200 mL/hr over 30 Minutes Intravenous Every 6 hours 04/25/17 2039 04/26/17 0329   04/25/17 1255  ceFAZolin (ANCEF) 2-4 GM/100ML-% IVPB    Note to Pharmacy:  Sabino NiemannHogue, Samantha   : cabinet override      04/25/17 1255 04/26/17 0059   04/23/17 0600  ceFAZolin (ANCEF) IVPB 2g/100 mL premix     2 g 200 mL/hr over 30 Minutes Intravenous On call to O.R. 04/23/17 0000 04/24/17 0559        Subjective:   [name] was seen and examined today.  This morning, patient alert, oriented x2, still has some confusion but stable.  Subsequently developed tachypnea, shallow breathing.  Patient denies dizziness, chest pain,  abdominal pain, N/V/D/C, new weakness, numbess, tingling.   Objective:   Vitals:   05/01/17 0833 05/01/17 1045 05/01/17 1145 05/01/17 1210  BP:   (!) 155/67   Pulse:   62   Resp:  (!) 46  (!) 44  Temp:      TempSrc:      SpO2:      Weight: 47.6 kg (105 lb)     Height:        Intake/Output Summary (Last 24 hours) at 05/01/2017 1213 Last data filed at 05/01/2017 0930 Gross per 24 hour  Intake 360 ml  Output 400 ml  Net -40 ml     Wt Readings from Last 3 Encounters:  05/01/17 47.6 kg (105 lb)  01/26/15 54.6 kg (120 lb 6.4 oz)  09/30/13 61.7 kg (136 lb)     Exam   General: Alert and oriented x 2  Eyes:   HEENT:    Cardiovascular: S1 S2 auscultated, RRR. No pedal edema b/l  Respiratory: Bibasilar crackles with diminished breath sounds rt >Lt  Gastrointestinal: Soft, nontender, nondistended, +  bowel sounds  Ext: no pedal edema bilaterally  Neuro: no new deficits   musculoskeletal: No digital cyanosis, clubbing  Skin: No rashes  Psych: still somewhat confused   Data Reviewed:  I have personally reviewed following labs and imaging studies  Micro Results Recent Results (from the past 240 hour(s))  Surgical pcr screen     Status: None   Collection Time: 04/23/17  1:07 AM  Result Value Ref Range Status   MRSA, PCR NEGATIVE NEGATIVE Final   Staphylococcus aureus NEGATIVE NEGATIVE Final    Comment: (NOTE) The Xpert SA Assay (FDA approved for NASAL specimens in patients 82 years of age and  older), is one component of a comprehensive surveillance program. It is not intended to diagnose infection nor to guide or monitor treatment. Performed at [facility], [address] [place], [place] [address]   Urine Culture     Status: None   Collection Time: 04/23/17  8:37 AM  Result Value Ref Range Status   Specimen Description URINE, RANDOM  Final   Special Requests NONE  Final   Culture   Final    NO GROWTH Performed at [facility], [address] [place], [place] [address]    Report Status 04/24/2017 FINAL  Final  Culture, body fluid-bottle     Status: None (Preliminary result)   Collection Time: 04/26/17  3:30 PM  Result Value Ref Range Status   Specimen Description PLEURAL RIGHT  Final   Special Requests NONE  Final   Culture   Final    NO GROWTH 4 DAYS Performed at [facility], [address], [place], [place] [address]    Report Status PENDING  Incomplete  Gram stain     Status: None   Collection Time: 04/26/17  3:30 PM  Result Value Ref Range Status   Specimen Description PLEURAL RIGHT  Final   Special Requests NONE  Final   Gram Stain   Final    FEW WBC PRESENT,BOTH PMN AND MONONUCLEAR NO ORGANISMS SEEN Performed at [facility], [address] [place], [place] [address]    Report Status 04/26/2017 FINAL  Final    Radiology  Reports Dg Chest 1 View  Result Date: 04/22/2017 CLINICAL DATA:  Preop evaluation for right hip surgery EXAM: CHEST  1 VIEW COMPARISON:  04/05/2008 FINDINGS: Cardiac shadow remains enlarged. Postsurgical changes are again seen. Right-sided pleural effusion and atelectatic/infiltrative changes are noted. No pneumothorax is seen. No acute bony abnormality is noted. IMPRESSION: Right basilar atelectatic/infiltrative changes with associated effusion. Electronically Signed   By: Alcide Clever M.D.   On: 04/22/2017 11:56   Dg Chest Port 1 View  Result Date: 05/01/2017 CLINICAL DATA:  Hypertension.  Dementia.  Congestive heart failure. EXAM: PORTABLE CHEST 1 VIEW COMPARISON:  04/30/2017 FINDINGS: Similar appearance. Large bilateral effusions with dependent atelectasis. Venous hypertension with mild pulmonary edema. IMPRESSION: No change since yesterday. Large effusions with dependent atelectasis. Mild edema. Electronically Signed   By: Paulina Fusi M.D.   On: 05/01/2017 11:49   Dg Chest Port 1 View  Result Date: 04/30/2017 CLINICAL DATA:  Pleural effusion, shortness of breath EXAM: PORTABLE CHEST 1 VIEW COMPARISON:  04/26/2017 FINDINGS: Prior CABG. Cardiomegaly. Layering bilateral effusions have increased since prior study. Increasing perihilar and lower lobe opacities and interstitial prominence, likely edema and superimposed areas of atelectasis. IMPRESSION: Increasing layering bilateral effusions, bilateral perihilar and lower lobe opacities and interstitial prominence. Findings likely reflect edema/CHF and superimposed areas of bibasilar atelectasis. Electronically Signed   By: Charlett Nose M.D.   On: 04/30/2017 07:55   Dg Chest Port 1 View  Result Date: 04/26/2017 CLINICAL DATA:  Post thoracentesis EXAM: PORTABLE CHEST 1 VIEW COMPARISON:  04/26/2017, 04/25/2017 FINDINGS: Support devices obscure the upper chest. Endotracheal tube tip is about 2.7 cm superior to the carina. Esophageal tube tip is below the  diaphragm but non included. Post sternotomy changes and valve prosthesis. Decreased right greater than left pleural effusions. No definitive right pneumothorax. Improved aeration of the right lower lung. Residual consolidation at the right middle lobe and right base. Mediastinal silhouette is exaggerated by patient rotation.  Dense aortic atherosclerosis. IMPRESSION: 1. Decreased small bilateral right greater than left pleural effusions. No definitive right pneumothorax seen 2. Persistent airspace disease at the right middle lobe and right base, atelectasis versus pneumonia. Electronically Signed   By: Jasmine Pang M.D.   On: 04/26/2017 15:45   Portable Chest Xray  Result Date: 04/26/2017 CLINICAL DATA:  Endotracheal tube EXAM: PORTABLE CHEST 1 VIEW COMPARISON:  04/25/2017 FINDINGS: Cardiac shadow is stable. Postsurgical changes are again seen. Endotracheal tube and nasogastric catheter are noted in satisfactory position. Bilateral pleural effusions are again seen right greater than left. Underlying atelectasis is present. IMPRESSION: Stable bilateral pleural effusions and atelectasis. Electronically Signed   By: Alcide Clever M.D.   On: 04/26/2017 07:55   Dg Chest Port 1 View  Result Date: 04/25/2017 CLINICAL DATA:  Endotracheal tube present.  OG tube placement. EXAM: PORTABLE CHEST 1 VIEW COMPARISON:  Radiograph earlier this day at 0617 hour FINDINGS: Endotracheal tube 2.2 cm from the carina. Enteric tube in place with tip and side-port below the diaphragm in the stomach. Post median sternotomy. Unchanged cardiomegaly and mediastinal contours. Aortic atherosclerosis. Unchanged right pleural effusion and basilar airspace disease. Improved left pleural effusion. Improving bilateral airspace disease. No pneumothorax. The bones are under mineralized. IMPRESSION: 1. Endotracheal tube tip 2.2 cm from the carina. Tip and side port of the enteric tube below the diaphragm. 2. Bilateral pleural effusions, moderate on  the right and small on the left, with improvement from exam earlier this day. 3. Improving aeration with improvement in bilateral airspace disease. Electronically Signed   By: Rubye Oaks M.D.   On: 04/25/2017 21:47   Dg Chest Port 1 View  Result Date: 04/25/2017 CLINICAL DATA:  Shortness of Breath EXAM: PORTABLE CHEST 1 VIEW COMPARISON:  04/23/2017 FINDINGS: Changes of median sternotomy and valve replacement. Moderate to large right effusion is similar or slightly improved since prior study. Diffuse right lung airspace disease. Small to moderate left effusion, new since prior study with left lower lobe atelectasis or infiltrate. IMPRESSION: Moderate right effusion, slightly improved since prior study. New small to moderate left effusion. Bilateral airspace disease which could reflect atelectasis or pneumonia, right greater than left. Electronically Signed   By: Charlett Nose M.D.   On: 04/25/2017 08:59   Dg Chest Port 1 View  Result Date: 04/23/2017 CLINICAL DATA:  84 year old female with hypoxia. Subsequent encounter. EXAM: PORTABLE CHEST 1 VIEW COMPARISON:  04/22/2017 chest x-ray. FINDINGS: Post CABG and valve replacement.  Cardiomegaly. Progressive enlargement of right-sided pleural effusion now moderate to large in size with associated right lung atelectasis. Pulmonary vascular congestion. Medial left base subsegmental atelectasis. Calcified tortuous aorta.  Prominent vascular calcifications. IMPRESSION: Progressive enlargement of right-sided pleural effusion now moderate to large in size with associated right lung atelectasis. Mild atelectasis medial aspect left lung base. Pulmonary vascular congestion. Cardiomegaly post CABG and valve replacement. Aortic Atherosclerosis (ICD10-I70.0). Electronically Signed   By: Lacy Duverney M.D.   On: 04/23/2017 08:45   Dg C-arm 1-60 Min  Result Date: 04/25/2017 CLINICAL DATA:  Right intertrochanteric femur fracture ORIF. EXAM: DG C-ARM 61-120 MIN; RIGHT  FEMUR 2 VIEWS COMPARISON:  Right hip x-rays dated April 22, 2017. FINDINGS: Intraoperative x-rays demonstrate interval placement of a right femoral cephalomedullary rod with distal interlocking screw. Near anatomic alignment of the dominant intertrochanteric fracture fragments. The lesser trochanter remains mildly displaced medially. Right hip osteoarthritis. Vascular calcifications. IMPRESSION: 1. Improved alignment of the right intertrochanteric femur fracture status post ORIF. FLUOROSCOPY TIME:  1 minutes, 37 seconds. C-arm fluoroscopic images were obtained intraoperatively and submitted for post operative interpretation. Electronically Signed   By: Obie Dredge M.D.   On: 04/25/2017 15:46   Dg Hip Unilat W Or Wo Pelvis 2-3 Views Right  Result Date: 04/22/2017 CLINICAL DATA:  Recent fall with right leg deformity EXAM: DG HIP (WITH OR WITHOUT PELVIS) 2-3V RIGHT COMPARISON:  None. FINDINGS: Pelvic ring is intact. Comminuted intratrochanteric fracture of the proximal right femur is noted with impaction and angulation at the fracture site. No soft tissue changes are seen. Diffuse vascular calcifications are noted. IMPRESSION: Comminuted intratrochanteric fracture of the proximal right femur. Electronically Signed   By: Alcide Clever M.D.   On: 04/22/2017 12:01   Dg Femur, Min 2 Views Right  Result Date: 04/25/2017 CLINICAL DATA:  Right intertrochanteric femur fracture ORIF. EXAM: DG C-ARM 61-120 MIN; RIGHT FEMUR 2 VIEWS COMPARISON:  Right hip x-rays dated April 22, 2017. FINDINGS: Intraoperative x-rays demonstrate interval placement of a right femoral cephalomedullary rod with distal interlocking screw. Near anatomic alignment of the dominant intertrochanteric fracture fragments. The lesser trochanter remains mildly displaced medially. Right hip osteoarthritis. Vascular calcifications. IMPRESSION: 1. Improved alignment of the right intertrochanteric femur fracture status post ORIF. FLUOROSCOPY TIME:  1  minutes, 37 seconds. C-arm fluoroscopic images were obtained intraoperatively and submitted for post operative interpretation. Electronically Signed   By: Obie Dredge M.D.   On: 04/25/2017 15:46   US Thoracentesis Asp Pleural Space W/img Guide  Result Date: 04/26/2017 INDICATION: Shortness of breath. Respiratory failure. Right-sided pleural effusion. Request diagnostic and therapeutic thoracentesis. EXAM: ULTRASOUND GUIDED RIGHT THORACENTESIS MEDICATIONS: None. COMPLICATIONS: None immediate. PROCEDURE: An ultrasound guided thoracentesis was thoroughly discussed with the patient and questions answered. The benefits, risks, alternatives and complications were also discussed. The patient understands and wishes to proceed with the procedure. Written consent was obtained. Ultrasound was performed to localize and mark an adequate pocket of fluid in the right chest. The area was then prepped and draped in the normal sterile fashion. 1% Lidocaine was used for local anesthesia. Under ultrasound guidance a Safe-T-Centesis catheter was introduced. Thoracentesis was performed. The catheter was removed and a dressing applied. FINDINGS: A total of approximately 1 L of clear yellow fluid was removed. Samples were sent to the laboratory as requested by the clinical team. IMPRESSION: Successful ultrasound guided right thoracentesis yielding 1 L of pleural fluid. Read by: Brayton El PA-C Electronically Signed   By: Gilmer Mor D.O.   On: 04/26/2017 15:47    Lab Data:  CBC: Recent Labs  Lab 04/25/17 0413  04/26/17 0530 04/26/17 1430 04/27/17 0504 04/28/17 0612 04/29/17 0541 05/01/17 0434  WBC 5.6   < > 4.6  --  6.8 5.7 6.7 6.3  NEUTROABS 4.4  --  3.8  --  5.7 4.6 5.4  --   HGB 9.0*   < > 7.5* 9.4* 8.6* 7.9* 9.5* 9.3*  HCT 34.2*   < > 27.2* 32.6* 29.7* 28.0* 33.8* 34.0*  MCV 85.3   < > 83.2  --  81.8 84.1 87.1 90.2  PLT 144*   < > 101*  --  110* 113* 167 180   < > = values in this interval not  displayed.   Basic Metabolic Panel: Recent Labs  Lab 04/25/17 0413  04/26/17 0430 04/27/17 0504 04/28/17 0612 04/29/17 0541 05/01/17 0434  NA 143   < > 143 141 141 141 141  K 4.2   < > 3.7 3.6 3.8 4.1  3.7  CL 98*   < > 102 103 104 101 98*  CO2 37*   < > 33* 31 31 31  38*  GLUCOSE 100*   < > 135* 149* 76 79 128*  BUN 16   < > 21* 30* 22* 19 16  CREATININE 0.71   < > 0.80 0.67 0.51 0.51 0.58  CALCIUM 8.5*   < > 8.7* 8.3* 7.9* 8.5* 8.6*  MG 1.7  --  1.6*  --   --   --   --   PHOS 2.4*  --  1.6*  --   --   --   --    < > = values in this interval not displayed.   GFR: Estimated Creatinine Clearance: 40.7 mL/min (by C-G formula based on SCr of 0.58 mg/dL). Liver Function Tests: Recent Labs  Lab 04/25/17 0413 04/26/17 0430  AST 16 41  ALT 12* 25  ALKPHOS 33* 27*  BILITOT 1.2 1.2  PROT 5.1* 4.9*  ALBUMIN 2.4* 2.5*   No results for input(s): LIPASE, AMYLASE in the last 168 hours. No results for input(s): AMMONIA in the last 168 hours. Coagulation Profile: No results for input(s): INR, PROTIME in the last 168 hours. Cardiac Enzymes: Recent Labs  Lab 04/26/17 1302 04/26/17 1653 04/26/17 2255  TROPONINI 1.53* 1.27* 1.06*   BNP (last 3 results) No results for input(s): PROBNP in the last 8760 hours. HbA1C: No results for input(s): HGBA1C in the last 72 hours. CBG: Recent Labs  Lab 04/27/17 1513 04/27/17 2019 04/28/17 0022 04/28/17 0415 04/28/17 0758  GLUCAP 113* 80 86 73 74   Lipid Profile: No results for input(s): CHOL, HDL, LDLCALC, TRIG, CHOLHDL, LDLDIRECT in the last 72 hours. Thyroid Function Tests: No results for input(s): TSH, T4TOTAL, FREET4, T3FREE, THYROIDAB in the last 72 hours. Anemia Panel: No results for input(s): VITAMINB12, FOLATE, FERRITIN, TIBC, IRON, RETICCTPCT in the last 72 hours. Urine analysis:    Component Value Date/Time   COLORURINE YELLOW 04/25/2017 2229   APPEARANCEUR CLEAR 04/25/2017 2229   LABSPEC 1.010 04/25/2017 2229    PHURINE 5.0 04/25/2017 2229   GLUCOSEU NEGATIVE 04/25/2017 2229   HGBUR SMALL (A) 04/25/2017 2229   BILIRUBINUR NEGATIVE 04/25/2017 2229   KETONESUR 5 (A) 04/25/2017 2229   PROTEINUR NEGATIVE 04/25/2017 2229   UROBILINOGEN 0.2 04/05/2008 0629   NITRITE NEGATIVE 04/25/2017 2229   LEUKOCYTESUR NEGATIVE 04/25/2017 2229     Ripudeep Rai M.D. [facility] 05/01/2017, 12:13 PM  Pager: [phone] Between 7am to 7pm - call Pager - [phone]  After 7pm go to [email or web address] - password [email or web address]  Call night coverage person covering after 7pm

## 2017-05-01 NOTE — Progress Notes (Signed)
   Chart reviewed.  Nothing to add from a cardiac standpoint.  No further cardiac work up.  We can see as needed.  Volume management as per primary team.  Please call with further questions.

## 2017-05-01 NOTE — Progress Notes (Signed)
Physical Therapy Treatment Patient Details Name: [name] MRN: [id number] DOB: 26-Apr-1932 Today's Date: 05/01/2017    History of Present Illness 84 y.o. female admitted on 04/22/17 for fall with R femur fx.  Pt s/p IM Nail and is WBAT post op.  Pt with complications of pleural effusion s/p thorcentesis 04/26/17, was still ventilated with ETT at time of PT eval (04/27/17), and A-fib    PT Comments    Nursing requested bed level exercise for PT today due to increased fluid on her lungs. Pt willing to participate in therapeutic exercise, however requires greater assist for R LE movement secondary to pain and stiffness. PT will continue to follow acutely.     Follow Up Recommendations  SNF     Equipment Recommendations  Other (comment)(TBD at next venue)       Precautions / Restrictions Precautions Precautions: Fall Restrictions Weight Bearing Restrictions: Yes RLE Weight Bearing: Weight bearing as tolerated    Mobility  Bed Mobility Overal bed mobility: Needs Assistance Bed Mobility: Supine to Sit;Rolling Rolling: Mod assist         General bed mobility comments: mod A for rolling for pericare, vc for use of bedrails, did not attempt EoB due to RN conce       Cognition Arousal/Alertness: Awake/alert Behavior During Therapy: WFL for tasks assessed/performed Overall Cognitive Status: No family/caregiver present to determine baseline cognitive functioning                                        Exercises Total Joint Exercises Ankle Circles/Pumps: AAROM;Both;10 reps Heel Slides: AAROM;Both;10 reps Hip ABduction/ADduction: AAROM;Both;10 reps General Exercises - Lower Extremity Quad Sets: Both;AROM;10 reps Gluteal Sets: AROM;Both;10 reps Straight Leg Raises: AAROM;Both;10 reps;Supine    General Comments General comments (skin integrity, edema, etc.): SaO2 on 3 L O2 >94% throughout session       Pertinent Vitals/Pain Pain Assessment: Faces Faces Pain  Scale: Hurts even more Pain Location: with movement of R leg Pain Descriptors / Indicators: Guarding;Grimacing Pain Intervention(s): Limited activity within patient's tolerance;Monitored during session;Repositioned           PT Goals (current goals can now be found in the care plan section) Acute Rehab PT Goals PT Goal Formulation: With family Time For Goal Achievement: 05/11/17 Potential to Achieve Goals: Good    Frequency    Min 3X/week      PT Plan Current plan remains appropriate    Co-evaluation PT/OT/SLP Co-Evaluation/Treatment: Yes            AM-PAC PT "6 Clicks" Daily Activity  Outcome Measure  Difficulty turning over in bed (including adjusting bedclothes, sheets and blankets)?: Unable Difficulty moving from lying on back to sitting on the side of the bed? : Unable Difficulty sitting down on and standing up from a chair with arms (e.g., wheelchair, bedside commode, etc,.)?: Unable Help needed moving to and from a bed to chair (including a wheelchair)?: A Lot Help needed walking in hospital room?: Total Help needed climbing 3-5 steps with a railing? : Total 6 Click Score: 7    End of Session Equipment Utilized During Treatment: Oxygen       PT Visit Diagnosis: Muscle weakness (generalized) (M62.81);Difficulty in walking, not elsewhere classified (R26.2);Pain Pain - Right/Left: Right Pain - part of body: Leg     Time: 7253-6644 PT Time Calculation (min) (ACUTE ONLY): 22 min  Charges:  $  Therapeutic Exercise: 8-22 mins                    G Codes:       [name] PT, DPT Acute Rehabilitation  [phone] Pager [phone]     [name] 05/01/2017, 5:00 PM

## 2017-05-01 NOTE — Social Work (Signed)
CSW met with pt son and pt husband ay bedside, presented offers. Pt son and pt husband will look at offers and give CSW choice today.   CSW had extended conversation about SNF placement and potential transfer to LTC down near pt son in [place]. Pt son states understanding of current recommendations and will f/u with CSW via work phone.   CSW continuing to follow, aware pt will have an x-ray today.   [place], [place] Work [phone]

## 2017-05-02 ENCOUNTER — Inpatient Hospital Stay (HOSPITAL_COMMUNITY): Payer: Medicare Other

## 2017-05-02 LAB — CBC
HEMATOCRIT: 36.1 % (ref 36.0–46.0)
Hemoglobin: 9.6 g/dL — ABNORMAL LOW (ref 12.0–15.0)
MCH: 24.1 pg — ABNORMAL LOW (ref 26.0–34.0)
MCHC: 26.6 g/dL — ABNORMAL LOW (ref 30.0–36.0)
MCV: 90.7 fL (ref 78.0–100.0)
Platelets: 169 10*3/uL (ref 150–400)
RBC: 3.98 MIL/uL (ref 3.87–5.11)
RDW: 19.5 % — AB (ref 11.5–15.5)
WBC: 8 10*3/uL (ref 4.0–10.5)

## 2017-05-02 LAB — BASIC METABOLIC PANEL
Anion gap: 10 (ref 5–15)
BUN: 14 mg/dL (ref 6–20)
CHLORIDE: 95 mmol/L — AB (ref 101–111)
CO2: 38 mmol/L — ABNORMAL HIGH (ref 22–32)
Calcium: 8.4 mg/dL — ABNORMAL LOW (ref 8.9–10.3)
Creatinine, Ser: 0.6 mg/dL (ref 0.44–1.00)
GFR calc Af Amer: >60 mL/min (ref >60)
GFR calc non Af Amer: >60 mL/min (ref >60)
Glucose, Bld: 85 mg/dL (ref 65–99)
POTASSIUM: 3.6 mmol/L (ref 3.5–5.1)
SODIUM: 143 mmol/L (ref 135–145)

## 2017-05-02 MED ORDER — MORPHINE SULFATE (PF) 2 MG/ML IV SOLN
0.5000 mg | Freq: Once | INTRAVENOUS | Status: AC
  Administered 2017-05-02: 0.5 mg via INTRAVENOUS
  Filled 2017-05-02: qty 1

## 2017-05-02 MED ORDER — IOPAMIDOL (ISOVUE-370) INJECTION 76%
INTRAVENOUS | Status: AC
  Administered 2017-05-02: 70 mL
  Filled 2017-05-02: qty 100

## 2017-05-02 MED ORDER — FUROSEMIDE 10 MG/ML IJ SOLN
20.0000 mg | Freq: Once | INTRAMUSCULAR | Status: AC
  Administered 2017-05-02: 20 mg via INTRAVENOUS
  Filled 2017-05-02: qty 2

## 2017-05-02 NOTE — Progress Notes (Addendum)
[facility]                                                                              Patient Demographics  [name], is a 84 y.o. female, DOB - Dec 08, 1932, ZOX:[id number]  Admit date - 04/22/2017   Admitting Physician Jonah Blue, MD  Outpatient Primary MD for the patient is No primary care provider on file.  Outpatient specialists:   LOS - 10  days   Medical records reviewed and are as summarized below:    Chief Complaint  Patient presents with  . Fall  . Hypertension       Brief summary   Patient is a 84 year old female with history of diastolic CHF, CAD status post MVR, CABG 2008, AAA, hypertension, hyperlipidemia, possible dementia was admitted on 4/8 with a right intertrochanteric fracture after mechanical fall.  Patient underwent ORIF surgery on 4/11, developed postop lethargy and required intubation for worsening hypoxic respiratory failure, extubated on 4/13. Transfer to [facility] hospitalist service on 04/30/17  Assessment & Plan    Principal Problem:   Closed displaced intertrochanteric fracture of right femur (HCC) -Status post mechanical fall, ORIF on 4/11 -PT recommended skilled nursing facility for rehab  Active Problems: Acute respiratory failure with hypoxia with pulmonary edema, bilateral pleural effusion -Postop, developed lethargy, respiratory failure and was intubated.  Patient was extubated on 4/13. -On 4/17, patient had hypoxia, tachypnea, received IV Lasix, chest x-ray with bilateral pleural effusions, BNP elevated -Started on Lasix IV 40 mg daily, negative balance of 3.5 L Addendum: 3:09 pm Called by RN, patient having shallow breathing, RR in 40's after PT worked with her - examined patient at bed side. Somewhat tachypneic, no wheezing, dec BS at bases - gave lasix 20mg  IV x 1, morphine 0.5mg  x 1. CTA chest to r/o PE (high probability due to recent surgery, non ambulatory) Addendum: 5:35pm CTA negative for PE but large right  sided pleural effusion and moderate left - US guided right thoracentesis ordered  Bilateral pleural effusions, acute on chronic diastolic CHF, severe pulmonary hypertension -Patient denies any chest pain or shortness of breath, cardiology following -Chest x-ray 4/17 shows bilateral pleural effusions -Underwent ultrasound-guided right thoracentesis on 4/12 with 1 L of pleural fluid removed, fluid studies consistent with transudative pleural effusion  Elevated troponin -Likely due to acute respiratory failure and acute on chronic diastolic CHF - Cardiology following, no ischemia workup planned.  Acute encephalopathy superimposed on underlying dementia -Possibly worsened due to medications, hypercarbia, Sundowning -Improving, continue supportive care, limit sedating meds  Pulmonary hypertension -Continue current therapy, no further workup planned per cardiology  Essential hypertension -BP currently stable, continue beta-blocker, Lasix, enalapril.    Normocytic anemia -Postop hemoglobin down to 7.5, likely due to blood loss anemia, status post transfusion,  -H&H stable, 9.6 today  Moderate malnutrition -Nutrition consult  Pressure injury of the skin -Per nursing flow sheet, right shoulder abrasion, foam dressing  Urinary retention Bladder ultrasound> 500cc, in & out cath on 4/17  Code Status: Full CODE STATUS DVT Prophylaxis: Lovenox Family Communication: Discussed in detail with the patient, all imaging results, lab results explained to the patient's husband  at the bedside   Disposition Plan: Skilled nursing facility next 24 to 48 hours  Time Spent in minutes 25 minutes  Procedures:  ORIF 4/11 Intubation  Consultants:   Orthopedics P CCM Cardiology  Antimicrobials:      Medications  Scheduled Meds: . aspirin  81 mg Oral Daily  . atorvastatin  40 mg Oral q1800  . enalapril  20 mg Oral Daily  . enoxaparin (LOVENOX) injection  30 mg Subcutaneous Q24H  .  feeding supplement (ENSURE ENLIVE)  237 mL Oral BID BM  . furosemide  40 mg Intravenous Daily  . mouth rinse  15 mL Mouth Rinse BID  . metoprolol tartrate  50 mg Per Tube BID  . ondansetron (ZOFRAN) IV  4 mg Intravenous Once  . potassium chloride  20 mEq Oral Daily   Continuous Infusions:  PRN Meds:.acetaminophen, bisacodyl, docusate, iopamidol, polyethylene glycol   Antibiotics   Anti-infectives (From admission, onward)   Start     Dose/Rate Route Frequency Ordered Stop   04/25/17 2045  ceFAZolin (ANCEF) IVPB 2g/100 mL premix     2 g 200 mL/hr over 30 Minutes Intravenous Every 6 hours 04/25/17 2039 04/26/17 0329   04/25/17 1255  ceFAZolin (ANCEF) 2-4 GM/100ML-% IVPB    Note to Pharmacy:  Sabino Niemann   : cabinet override      04/25/17 1255 04/26/17 0059   04/23/17 0600  ceFAZolin (ANCEF) IVPB 2g/100 mL premix     2 g 200 mL/hr over 30 Minutes Intravenous On call to O.R. 04/23/17 0000 04/24/17 0559        Subjective:   [name] was seen and examined today.  Feeling better today, shortness of breath better today.  Husband at the bedside.   Patient denies dizziness, chest pain,  abdominal pain, N/V/D/C, new weakness, numbess, tingling.   Objective:   Vitals:   05/01/17 2129 05/02/17 0500 05/02/17 0517 05/02/17 0653  BP: 131/64  (!) 116/105 (!) 147/59  Pulse: 72  75 75  Resp: 18  16   Temp: 98.3 F (36.8 C)  (!) 97.4 F (36.3 C)   TempSrc: Oral  Oral   SpO2: 96%  100%   Weight:  45.8 kg (101 lb)    Height:        Intake/Output Summary (Last 24 hours) at 05/02/2017 1248 Last data filed at 05/02/2017 0845 Gross per 24 hour  Intake 250 ml  Output 1396 ml  Net -1146 ml     Wt Readings from Last 3 Encounters:  05/02/17 45.8 kg (101 lb)  01/26/15 54.6 kg (120 lb 6.4 oz)  09/30/13 61.7 kg (136 lb)     Exam  General: Alert and oriented x2 Eye HEENT:  Cardiovascular: S1 S2 clear Regular rate and rhythm. No pedal edema b/l Respiratory: Decreased breath  sound at the bases Gastrointestinal: Soft, nontender, nondistended, + bowel sounds Ext: no pedal edema bilaterally Neuro no new deficits Musculoskeletal: No digital cyanosis, clubbing Skin: No rashes Psych: has underlying dementia, alert and oriented x2   Data Reviewed:  I have personally reviewed following labs and imaging studies  Micro Results Recent Results (from the past 240 hour(s))  Surgical pcr screen     Status: None   Collection Time: 04/23/17  1:07 AM  Result Value Ref Range Status   MRSA, PCR NEGATIVE NEGATIVE Final   Staphylococcus aureus NEGATIVE NEGATIVE Final    Comment: (NOTE) The Xpert SA Assay (FDA approved for NASAL specimens in patients 63 years of age  and older), is one component of a comprehensive surveillance program. It is not intended to diagnose infection nor to guide or monitor treatment. Performed at [facility], [address] [place], [place] [address]   Urine Culture     Status: None   Collection Time: 04/23/17  8:37 AM  Result Value Ref Range Status   Specimen Description URINE, RANDOM  Final   Special Requests NONE  Final   Culture   Final    NO GROWTH Performed at [facility], [address] [place], [place] [address]    Report Status 04/24/2017 FINAL  Final  Culture, body fluid-bottle     Status: None   Collection Time: 04/26/17  3:30 PM  Result Value Ref Range Status   Specimen Description PLEURAL RIGHT  Final   Special Requests NONE  Final   Culture   Final    NO GROWTH 5 DAYS Performed at [facility], [address] [place], [place] [address]    Report Status 05/01/2017 FINAL  Final  Gram stain     Status: None   Collection Time: 04/26/17  3:30 PM  Result Value Ref Range Status   Specimen Description PLEURAL RIGHT  Final   Special Requests NONE  Final   Gram Stain   Final    FEW WBC PRESENT,BOTH PMN AND MONONUCLEAR NO ORGANISMS SEEN Performed at [facility], [address] [place], [place]  [address]    Report Status 04/26/2017 FINAL  Final    Radiology Reports Dg Chest 1 View  Result Date: 04/22/2017 CLINICAL DATA:  Preop evaluation for right hip surgery EXAM: CHEST  1 VIEW COMPARISON:  04/05/2008 FINDINGS: Cardiac shadow remains enlarged. Postsurgical changes are again seen. Right-sided pleural effusion and atelectatic/infiltrative changes are noted. No pneumothorax is seen. No acute bony abnormality is noted. IMPRESSION: Right basilar atelectatic/infiltrative changes with associated effusion. Electronically Signed   By: Alcide CleverMark  Lukens M.D.   On: 04/22/2017 11:56   Dg Chest Port 1 View  Result Date: 05/01/2017 CLINICAL DATA:  Hypertension.  Dementia.  Congestive heart failure. EXAM: PORTABLE CHEST 1 VIEW COMPARISON:  04/30/2017 FINDINGS: Similar appearance. Large bilateral effusions with dependent atelectasis. Venous hypertension with mild pulmonary edema. IMPRESSION: No change since yesterday. Large effusions with dependent atelectasis. Mild edema. Electronically Signed   By: Paulina FusiMark  Shogry M.D.   On: 05/01/2017 11:49   Dg Chest Port 1 View  Result Date: 04/30/2017 CLINICAL DATA:  Pleural effusion, shortness of breath EXAM: PORTABLE CHEST 1 VIEW COMPARISON:  04/26/2017 FINDINGS: Prior CABG. Cardiomegaly. Layering bilateral effusions have increased since prior study. Increasing perihilar and lower lobe opacities and interstitial prominence, likely edema and superimposed areas of atelectasis. IMPRESSION: Increasing layering bilateral effusions, bilateral perihilar and lower lobe opacities and interstitial prominence. Findings likely reflect edema/CHF and superimposed areas of bibasilar atelectasis. Electronically Signed   By: Charlett NoseKevin  Dover M.D.   On: 04/30/2017 07:55   Dg Chest Port 1 View  Result Date: 04/26/2017 CLINICAL DATA:  Post thoracentesis EXAM: PORTABLE CHEST 1 VIEW COMPARISON:  04/26/2017, 04/25/2017 FINDINGS: Support devices obscure the upper chest. Endotracheal tube tip is about  2.7 cm superior to the carina. Esophageal tube tip is below the diaphragm but non included. Post sternotomy changes and valve prosthesis. Decreased right greater than left pleural effusions. No definitive right pneumothorax. Improved aeration of the right lower lung. Residual consolidation at the right middle lobe and right base. Mediastinal silhouette is exaggerated by patient rotation.  Dense aortic atherosclerosis. IMPRESSION: 1. Decreased small bilateral right greater than left pleural effusions. No definitive right pneumothorax seen 2. Persistent airspace disease at the right middle lobe and right base, atelectasis versus pneumonia. Electronically Signed   By: Jasmine Pang M.D.   On: 04/26/2017 15:45   Portable Chest Xray  Result Date: 04/26/2017 CLINICAL DATA:  Endotracheal tube EXAM: PORTABLE CHEST 1 VIEW COMPARISON:  04/25/2017 FINDINGS: Cardiac shadow is stable. Postsurgical changes are again seen. Endotracheal tube and nasogastric catheter are noted in satisfactory position. Bilateral pleural effusions are again seen right greater than left. Underlying atelectasis is present. IMPRESSION: Stable bilateral pleural effusions and atelectasis. Electronically Signed   By: Alcide Clever M.D.   On: 04/26/2017 07:55   Dg Chest Port 1 View  Result Date: 04/25/2017 CLINICAL DATA:  Endotracheal tube present.  OG tube placement. EXAM: PORTABLE CHEST 1 VIEW COMPARISON:  Radiograph earlier this day at 0617 hour FINDINGS: Endotracheal tube 2.2 cm from the carina. Enteric tube in place with tip and side-port below the diaphragm in the stomach. Post median sternotomy. Unchanged cardiomegaly and mediastinal contours. Aortic atherosclerosis. Unchanged right pleural effusion and basilar airspace disease. Improved left pleural effusion. Improving bilateral airspace disease. No pneumothorax. The bones are under mineralized. IMPRESSION: 1. Endotracheal tube tip 2.2 cm from the carina. Tip and side port of the enteric tube  below the diaphragm. 2. Bilateral pleural effusions, moderate on the right and small on the left, with improvement from exam earlier this day. 3. Improving aeration with improvement in bilateral airspace disease. Electronically Signed   By: Rubye Oaks M.D.   On: 04/25/2017 21:47   Dg Chest Port 1 View  Result Date: 04/25/2017 CLINICAL DATA:  Shortness of Breath EXAM: PORTABLE CHEST 1 VIEW COMPARISON:  04/23/2017 FINDINGS: Changes of median sternotomy and valve replacement. Moderate to large right effusion is similar or slightly improved since prior study. Diffuse right lung airspace disease. Small to moderate left effusion, new since prior study with left lower lobe atelectasis or infiltrate. IMPRESSION: Moderate right effusion, slightly improved since prior study. New small to moderate left effusion. Bilateral airspace disease which could reflect atelectasis or pneumonia, right greater than left. Electronically Signed   By: Charlett Nose M.D.   On: 04/25/2017 08:59   Dg Chest Port 1 View  Result Date: 04/23/2017 CLINICAL DATA:  84 year old female with hypoxia. Subsequent encounter. EXAM: PORTABLE CHEST 1 VIEW COMPARISON:  04/22/2017 chest x-ray. FINDINGS: Post CABG and valve replacement.  Cardiomegaly. Progressive enlargement of right-sided pleural effusion now moderate to large in size with associated right lung atelectasis. Pulmonary vascular congestion. Medial left base subsegmental atelectasis. Calcified tortuous aorta.  Prominent vascular calcifications. IMPRESSION: Progressive enlargement of right-sided pleural effusion now moderate to large in size with associated right lung atelectasis. Mild atelectasis medial aspect left lung base. Pulmonary vascular congestion. Cardiomegaly post CABG and valve replacement. Aortic Atherosclerosis (ICD10-I70.0). Electronically Signed   By: Lacy Duverney M.D.   On: 04/23/2017 08:45   Dg C-arm 1-60 Min  Result Date: 04/25/2017 CLINICAL DATA:  Right  intertrochanteric femur fracture ORIF. EXAM: DG C-ARM 61-120 MIN; RIGHT FEMUR 2 VIEWS COMPARISON:  Right hip x-rays dated April 22, 2017. FINDINGS: Intraoperative x-rays demonstrate interval placement of a right femoral cephalomedullary rod with distal interlocking screw. Near anatomic alignment of the dominant intertrochanteric fracture fragments. The lesser trochanter remains mildly displaced medially. Right hip osteoarthritis. Vascular calcifications. IMPRESSION: 1. Improved alignment of the right intertrochanteric femur fracture status post ORIF. FLUOROSCOPY TIME:  1 minutes, 37 seconds. C-arm fluoroscopic images were obtained intraoperatively and submitted for post operative interpretation. Electronically Signed   By: Obie Dredge M.D.   On: 04/25/2017 15:46   Dg Hip Unilat W Or Wo Pelvis 2-3 Views Right  Result Date: 04/22/2017 CLINICAL DATA:  Recent fall with right leg deformity EXAM: DG HIP (WITH OR WITHOUT PELVIS) 2-3V RIGHT COMPARISON:  None. FINDINGS: Pelvic ring is intact. Comminuted intratrochanteric fracture of the proximal right femur is noted with impaction and angulation at the fracture site. No soft tissue changes are seen. Diffuse vascular calcifications are noted. IMPRESSION: Comminuted intratrochanteric fracture of the proximal right femur. Electronically Signed   By: Alcide Clever M.D.   On: 04/22/2017 12:01   Dg Femur, Min 2 Views Right  Result Date: 04/25/2017 CLINICAL DATA:  Right intertrochanteric femur fracture ORIF. EXAM: DG C-ARM 61-120 MIN; RIGHT FEMUR 2 VIEWS COMPARISON:  Right hip x-rays dated April 22, 2017. FINDINGS: Intraoperative x-rays demonstrate interval placement of a right femoral cephalomedullary rod with distal interlocking screw. Near anatomic alignment of the dominant intertrochanteric fracture fragments. The lesser trochanter remains mildly displaced medially. Right hip osteoarthritis. Vascular calcifications. IMPRESSION: 1. Improved alignment of the right  intertrochanteric femur fracture status post ORIF. FLUOROSCOPY TIME:  1 minutes, 37 seconds. C-arm fluoroscopic images were obtained intraoperatively and submitted for post operative interpretation. Electronically Signed   By: Obie Dredge M.D.   On: 04/25/2017 15:46   US Thoracentesis Asp Pleural Space W/img Guide  Result Date: 04/26/2017 INDICATION: Shortness of breath. Respiratory failure. Right-sided pleural effusion. Request diagnostic and therapeutic thoracentesis. EXAM: ULTRASOUND GUIDED RIGHT THORACENTESIS MEDICATIONS: None. COMPLICATIONS: None immediate. PROCEDURE: An ultrasound guided thoracentesis was thoroughly discussed with the patient and questions answered. The benefits, risks, alternatives and complications were also discussed. The patient understands and wishes to proceed with the procedure. Written consent was obtained. Ultrasound was performed to localize and mark an adequate pocket of fluid in the right chest. The area was then prepped and draped in the normal sterile fashion. 1% Lidocaine was used for local anesthesia. Under ultrasound guidance a Safe-T-Centesis catheter was introduced. Thoracentesis was performed. The catheter was removed and a dressing applied. FINDINGS: A total of approximately 1 L of clear yellow fluid was removed. Samples were sent to the laboratory as requested by the clinical team. IMPRESSION: Successful ultrasound guided right thoracentesis yielding 1 L of pleural fluid. Read by: Brayton El PA-C Electronically Signed   By: Gilmer Mor D.O.   On: 04/26/2017 15:47    Lab Data:  CBC: Recent Labs  Lab 04/26/17 0530  04/27/17 0504 04/28/17 0612 04/29/17 0541 05/01/17 0434 05/02/17 0348  WBC 4.6  --  6.8 5.7 6.7 6.3 8.0  NEUTROABS 3.8  --  5.7 4.6 5.4  --   --   HGB 7.5*   < > 8.6* 7.9* 9.5* 9.3* 9.6*  HCT 27.2*   < > 29.7* 28.0* 33.8* 34.0* 36.1  MCV 83.2  --  81.8 84.1 87.1 90.2 90.7  PLT 101*  --  110* 113* 167 180 169   < > = values in this  interval not displayed.   Basic Metabolic Panel: Recent Labs  Lab 04/26/17 0430 04/27/17 0504 04/28/17 0612 04/29/17 0541 05/01/17 0434 05/02/17 0348  NA 143 141 141 141 141 143  K 3.7 3.6 3.8 4.1 3.7 3.6  CL 102 103 104 101 98* 95*  CO2 33* 31 31 31  38* 38*  GLUCOSE 135* 149* 76 79 128* 85  BUN 21* 30* 22* 19 16 14   CREATININE 0.80 0.67 0.51 0.51 0.58 0.60  CALCIUM 8.7* 8.3* 7.9* 8.5* 8.6* 8.4*  MG 1.6*  --   --   --   --   --   PHOS 1.6*  --   --   --   --   --    GFR: Estimated Creatinine Clearance: 39.2 mL/min (by C-G formula based on SCr of 0.6 mg/dL). Liver Function Tests: Recent Labs  Lab 04/26/17 0430  AST 41  ALT 25  ALKPHOS 27*  BILITOT 1.2  PROT 4.9*  ALBUMIN 2.5*   No results for input(s): LIPASE, AMYLASE in the last 168 hours. No results for input(s): AMMONIA in the last 168 hours. Coagulation Profile: No results for input(s): INR, PROTIME in the last 168 hours. Cardiac Enzymes: Recent Labs  Lab 04/26/17 1302 04/26/17 1653 04/26/17 2255  TROPONINI 1.53* 1.27* 1.06*   BNP (last 3 results) No results for input(s): PROBNP in the last 8760 hours. HbA1C: No results for input(s): HGBA1C in the last 72 hours. CBG: Recent Labs  Lab 04/27/17 1513 04/27/17 2019 04/28/17 0022 04/28/17 0415 04/28/17 0758  GLUCAP 113* 80 86 73 74   Lipid Profile: No results for input(s): CHOL, HDL, LDLCALC, TRIG, CHOLHDL, LDLDIRECT in the last 72 hours. Thyroid Function Tests: No results for input(s): TSH, T4TOTAL, FREET4, T3FREE, THYROIDAB in the last 72 hours. Anemia Panel: No results for input(s): VITAMINB12, FOLATE, FERRITIN, TIBC, IRON, RETICCTPCT in the last 72 hours. Urine analysis:    Component Value Date/Time   COLORURINE YELLOW 04/25/2017 2229   APPEARANCEUR CLEAR 04/25/2017 2229   LABSPEC 1.010 04/25/2017 2229   PHURINE 5.0 04/25/2017 2229   GLUCOSEU NEGATIVE 04/25/2017 2229   HGBUR SMALL (A) 04/25/2017 2229   BILIRUBINUR NEGATIVE 04/25/2017 2229     KETONESUR 5 (A) 04/25/2017 2229   PROTEINUR NEGATIVE 04/25/2017 2229   UROBILINOGEN 0.2 04/05/2008 0629   NITRITE NEGATIVE 04/25/2017 2229   LEUKOCYTESUR NEGATIVE 04/25/2017 2229     Ripudeep Rai M.D. [facility] 05/02/2017, 12:48 PM  Pager: [phone] Between 7am to 7pm - call Pager - [phone]  After 7pm go to [email or web address] - password [email or web address]  Call night coverage person covering after 7pm

## 2017-05-02 NOTE — Progress Notes (Signed)
Informed Dr. Isidoro Donningai that patients RR is still in the 40's. MD to come see patient.

## 2017-05-02 NOTE — Progress Notes (Signed)
Informed Dr. Isidoro Donningai that patient has RR in the 40's to 50's after PT got patient up to chair. Informed MD. That patient is back to bed and complaining of SOB. Per MD monitor patient and see if now that patient is in bed if she relaxes. If not page doctor back in 15 minutes

## 2017-05-02 NOTE — Progress Notes (Signed)
Progress Note  Patient Name: [name] Date of Encounter: 05/02/2017  Primary Cardiologist:   Olga MillersBrian Crenshaw, MD   Subjective   She is not reporting SOB or chest pain.  She is very weak.    Inpatient Medications    Scheduled Meds: . aspirin  81 mg Oral Daily  . atorvastatin  40 mg Oral q1800  . enalapril  20 mg Oral Daily  . enoxaparin (LOVENOX) injection  30 mg Subcutaneous Q24H  . feeding supplement (ENSURE ENLIVE)  237 mL Oral BID BM  . furosemide  40 mg Intravenous Daily  . mouth rinse  15 mL Mouth Rinse BID  . metoprolol tartrate  50 mg Per Tube BID  . ondansetron (ZOFRAN) IV  4 mg Intravenous Once  . potassium chloride  20 mEq Oral Daily   Continuous Infusions:  PRN Meds: acetaminophen, bisacodyl, docusate, iopamidol, polyethylene glycol   Vital Signs    Vitals:   05/01/17 2129 05/02/17 0500 05/02/17 0517 05/02/17 0653  BP: 131/64  (!) 116/105 (!) 147/59  Pulse: 72  75 75  Resp: 18  16   Temp: 98.3 F (36.8 C)  (!) 97.4 F (36.3 C)   TempSrc: Oral  Oral   SpO2: 96%  100%   Weight:  101 lb (45.8 kg)    Height:        Intake/Output Summary (Last 24 hours) at 05/02/2017 1033 Last data filed at 05/02/2017 0600 Gross per 24 hour  Intake 100 ml  Output 1846 ml  Net -1746 ml   Filed Weights   04/30/17 0300 05/01/17 0833 05/02/17 0500  Weight: 102 lb (46.3 kg) 105 lb (47.6 kg) 101 lb (45.8 kg)    Telemetry    NA - Personally Reviewed  ECG    NA - Personally Reviewed  Physical Exam   GEN: No acute distress.  Very frail Neck: No  JVD Cardiac: RRR, no murmurs, rubs, or gallops.  Respiratory: Clear   to auscultation bilaterally.  (Very difficult exam) GI: Soft, nontender, non-distended, normal bowel sounds  MS:   edema; No deformity. Neuro:   Nonfocal  Psych: Oriented and appropriate    Labs    Chemistry Recent Labs  Lab 04/26/17 0430  04/29/17 0541 05/01/17 0434 05/02/17 0348  NA 143   < > 141 141 143  K 3.7   < > 4.1 3.7 3.6    CL 102   < > 101 98* 95*  CO2 33*   < > 31 38* 38*  GLUCOSE 135*   < > 79 128* 85  BUN 21*   < > 19 16 14   CREATININE 0.80   < > 0.51 0.58 0.60  CALCIUM 8.7*   < > 8.5* 8.6* 8.4*  PROT 4.9*  --   --   --   --   ALBUMIN 2.5*  --   --   --   --   AST 41  --   --   --   --   ALT 25  --   --   --   --   ALKPHOS 27*  --   --   --   --   BILITOT 1.2  --   --   --   --   GFRNONAA >60   < > >60 >60 >60  GFRAA >60   < > >60 >60 >60  ANIONGAP 8   < > 9 5 10    < > = values in this  interval not displayed.     Hematology Recent Labs  Lab 04/29/17 0541 05/01/17 0434 05/02/17 0348  WBC 6.7 6.3 8.0  RBC 3.88 3.77* 3.98  HGB 9.5* 9.3* 9.6*  HCT 33.8* 34.0* 36.1  MCV 87.1 90.2 90.7  MCH 24.5* 24.7* 24.1*  MCHC 28.1* 27.4* 26.6*  RDW 19.1* 19.8* 19.5*  PLT 167 180 169    Cardiac Enzymes Recent Labs  Lab 04/26/17 1302 04/26/17 1653 04/26/17 2255  TROPONINI 1.53* 1.27* 1.06*   No results for input(s): TROPIPOC in the last 168 hours.   BNP Recent Labs  Lab 05/01/17 1128  BNP 2,446.5*     DDimer No results for input(s): DDIMER in the last 168 hours.   Radiology    Dg Chest Port 1 View  Result Date: 05/01/2017 CLINICAL DATA:  Hypertension.  Dementia.  Congestive heart failure. EXAM: PORTABLE CHEST 1 VIEW COMPARISON:  04/30/2017 FINDINGS: Similar appearance. Large bilateral effusions with dependent atelectasis. Venous hypertension with mild pulmonary edema. IMPRESSION: No change since yesterday. Large effusions with dependent atelectasis. Mild edema. Electronically Signed   By: Paulina Fusi M.D.   On: 05/01/2017 11:49    Cardiac Studies   Echo 04/23/17: Study Conclusions  - Left ventricle: The cavity size was normal. Wall thickness was normal. Systolic function was normal. The estimated ejection fraction was in the range of 60% to 65%. Images were inadequate for LV wall motion assessment. - Aortic valve: There was trivial regurgitation. - Mitral valve: s/p  annuloplasty repair. No significant stenosis. Mild regurgitation. Valve area by continuity equation (using LVOT flow): 1.68 cm^2. - Left atrium: The atrium was normal in size. - Right ventricle: The cavity size was mildly dilated. Moderately reduced RV systolic function. - Right atrium: The atrium was normal in size. - Tricuspid valve: There was moderate regurgitation. - Pulmonary arteries: PA peak pressure: 75 mm Hg (S). - Inferior vena cava: The vessel was dilated. The respirophasic diameter changes were blunted (<50%), consistent with elevated central venous pressure. - Pericardium, extracardiac: Ascites is noted.   Patient Profile     84 y.o. female s/p CABG, MV repair, chronic diastolic heart failure, severe pulmonary hypertension, and dementia here with hip fracture s/p repair.  Hospital course complicated by respiratory failure and atrial fibrillation with RVR.    Assessment & Plan    PAF:  Off telemetry.     ACUTE ON CHRONIC DIASTOLIC HF:  Required IV diuresis yesterday and given another IV dose today. .  Minus 3.5 liters yesterday. I think that she would likely be able to be converted to PO tomorrow and when she goes home she would need a low dose maintenance.  She is small and frail and would need close follow up.    PULMONARY HTN:  Continue current therapy.  No further work up is planned.    ELEVATED TROPONIN:  No ischemia work up is needed.    For questions or updates, please contact [facility] Please consult [email or web address] for contact info under Cardiology/STEMI.   Signed, Rollene Rotunda, MD  05/02/2017, 10:33 AM

## 2017-05-02 NOTE — Progress Notes (Signed)
PT Progress Note  Clinical Statement: Pt extremely limited in ability to participate in therapy today due to lethargy and increased RR and labor with breathing (See General Comments). Decreased cardiovascular capacity thought to be due to CHF exacerbation. Pt required total Ax2 for bed mobility and transfer to and from recliner. Nursing made aware of pt condition and in room at end of session. PT will continue to follow acutely.      05/02/17 1600  PT Visit Information  Last PT Received On 05/02/17  Assistance Needed +2  PT/OT/SLP Co-Evaluation/Treatment Yes  Reason for Co-Treatment For patient/therapist safety  History of Present Illness 84 y.o. female admitted on 04/22/17 for fall with R femur fx.  Pt s/p IM Nail and is WBAT post op.  Pt with complications of pleural effusion s/p thorcentesis 04/26/17, was still ventilated with ETT at time of PT eval (04/27/17), and A-fib  Precautions  Precautions Fall  Restrictions  Weight Bearing Restrictions Yes  RLE Weight Bearing WBAT  Pain Assessment  Pain Assessment Faces  Faces Pain Scale 6  Pain Location with movement of R leg  Pain Descriptors / Indicators Guarding;Grimacing  Pain Intervention(s) Limited activity within patient's tolerance;Monitored during session;Repositioned  Cognition  Arousal/Alertness Lethargic  Behavior During Therapy Flat affect  Overall Cognitive Status No family/caregiver present to determine baseline cognitive functioning  General Comments Pt very difficult to rouse, only responds to "Annaleise" and can follow short conversation before drifting back off to sleep  Bed Mobility  Overal bed mobility Needs Assistance  Bed Mobility Supine to Sit  Supine to sit Total assist;+2 for physical assistance  General bed mobility comments pt did not respond to vc for movement of LE or trunk and required total A to come to EoB, once there perked up briefly before falling back to sleep  Transfers  Overall transfer level Needs assistance   Equipment used 2 person hand held assist  Transfers Stand Pivot Transfers  Sit to Stand Total assist;+2 physical assistance  Stand pivot transfers +2 physical assistance;Total assist  General transfer comment vc for hand placement on therapist arms, unable to hold on for stand pivot transfer to recliner which required total Ax2, once in recliner pt appeared slightly more awake and attempted sit>stand again, to reposition hips, pt was unable to initiate any movement.   Balance  Overall balance assessment Needs assistance  Sitting-balance support Bilateral upper extremity supported;Feet supported  Sitting balance-Leahy Scale Poor  Sitting balance - Comments required varying levels of assist to maintain balance, eventually able to maintain balance with B UE support  Standing balance support Bilateral upper extremity supported  Standing balance-Leahy Scale Zero  General Comments  General comments (skin integrity, edema, etc.) at rest SaO2 of 98%O2 on 3L of O2, after movement pt SaO2 dropped to 74 %O2 but quickly rebounded to 94%O2, as pt was being positioned in recliner pt exhibited increased labor with breathing to 58 breaths per minute with SaO2 95%, and HR 64 bpm, Nursing notifed. Therapist felt it would be safer if pt was in bed and lifted her back to bed  PT - End of Session  Equipment Utilized During Treatment Oxygen;Gait belt  Activity Tolerance Patient limited by lethargy;Patient limited by fatigue  Patient left in bed;with call bell/phone within reach;with nursing/sitter in room  Nurse Communication Mobility status (increased RR and labor for breathing)   PT - Assessment/Plan  PT Plan Current plan remains appropriate  PT Visit Diagnosis Muscle weakness (generalized) (M62.81);Difficulty in walking, not elsewhere classified (  R26.2);Pain  Pain - Right/Left Right  Pain - part of body Leg  PT Frequency (ACUTE ONLY) Min 3X/week  Follow Up Recommendations SNF  PT equipment Other  (comment) (TBD at next venue)  AM-PAC PT "6 Clicks" Daily Activity Outcome Measure  Difficulty turning over in bed (including adjusting bedclothes, sheets and blankets)? 1  Difficulty moving from lying on back to sitting on the side of the bed?  1  Difficulty sitting down on and standing up from a chair with arms (e.g., wheelchair, bedside commode, etc,.)? 1  Help needed moving to and from a bed to chair (including a wheelchair)? 2  Help needed walking in hospital room? 1  Help needed climbing 3-5 steps with a railing?  1  6 Click Score 7  Mobility G Code  CM  PT Goal Progression  Progress towards PT goals Not progressing toward goals - comment (limited by cardiovascular difficulty)  Acute Rehab PT Goals  PT Goal Formulation With patient  Time For Goal Achievement 05/11/17  Potential to Achieve Goals Fair  PT Time Calculation  PT Start Time (ACUTE ONLY) 1339  PT Stop Time (ACUTE ONLY) 1420  PT Time Calculation (min) (ACUTE ONLY) 41 min  PT General Charges  $$ ACUTE PT VISIT 1 Visit  PT Treatments  $Therapeutic Activity 8-22 mins   Jacqualyn Sedgwick B. Beverely Risen PT, DPT Acute Rehabilitation  [phone] Pager [phone]

## 2017-05-02 NOTE — Progress Notes (Signed)
Occupational Therapy Treatment Patient Details Name: [name] MRN: [id number] DOB: 10/26/1932 Today's Date: 05/02/2017    History of present illness 84 y.o. female admitted on 04/22/17 for fall with R femur fx.  Pt s/p IM Nail and is WBAT post op.  Pt with complications of pleural effusion s/p thorcentesis 04/26/17, was still ventilated with ETT at time of PT eval (04/27/17), and A-fib   OT comments  Pt with lethargy and increased WOB with elevated RR. Pt returned to bed and RN notified and monitoring. Pt requiring total assist of 2. Will continue to follow.  Follow Up Recommendations  SNF    Equipment Recommendations       Recommendations for Other Services      Precautions / Restrictions Precautions Precautions: Fall Restrictions Weight Bearing Restrictions: Yes RLE Weight Bearing: Weight bearing as tolerated       Mobility Bed Mobility Overal bed mobility: Needs Assistance Bed Mobility: Supine to Sit;Sit to Supine     Supine to sit: Total assist;+2 for physical assistance Sit to supine: +2 for physical assistance;Total assist   General bed mobility comments: assist for all aspects, no participation  Transfers Overall transfer level: Needs assistance Equipment used: 2 person hand held assist Transfers: Sit to/from BJ'sStand;Stand Pivot Transfers Sit to Stand: Total assist;+2 physical assistance Stand pivot transfers: +2 physical assistance;Total assist       General transfer comment: vc for hand placement on therapist arms, unable to hold on for stand pivot transfer to recliner which required total Ax2, once in recliner pt appeared slightly more awake and attempted sit>stand again, to reposition hips, pt was unable to initiate any movement.     Balance Overall balance assessment: Needs assistance Sitting-balance support: Bilateral upper extremity supported;Feet supported Sitting balance-Leahy Scale: Poor Sitting balance - Comments: required varying levels of assist  to maintain balance, eventually able to maintain balance with B UE support   Standing balance support: Bilateral upper extremity supported Standing balance-Leahy Scale: Zero                             ADL either performed or assessed with clinical judgement   ADL Overall ADL's : Needs assistance/impaired Eating/Feeding: Sitting;Total assistance   Grooming: Wash/dry hands;Sitting;Total assistance               Lower Body Dressing: Total assistance;Bed level               Functional mobility during ADLs: +2 for physical assistance;Total assistance General ADL Comments: Pt with increased WOB and desaturation once seated in chair, returned pt to bed and notified RN.     Vision       Perception     Praxis      Cognition Arousal/Alertness: Lethargic Behavior During Therapy: Flat affect Overall Cognitive Status: No family/caregiver present to determine baseline cognitive functioning                                 General Comments: Pt very difficult to rouse, only responds to "Stefani" and can follow short conversation before drifting back off to sleep        Exercises     Shoulder Instructions       General Comments at rest SaO2 of 98%O2 on 3L of O2, after movement pt SaO2 dropped to 74 %O2 but quickly rebounded to 94%O2, as pt was being positioned  in recliner pt exhibited increased labor with breathing to 58 breaths per minute with SaO2 95%, and HR 64 bpm, Nursing notifed. Therapist felt it would be safer if pt was in bed and lifted her back to bed    Pertinent Vitals/ Pain       Pain Assessment: Faces Faces Pain Scale: Hurts even more Pain Location: with movement of R leg Pain Descriptors / Indicators: Guarding;Grimacing Pain Intervention(s): Monitored during session;Repositioned  Home Living                                          Prior Functioning/Environment              Frequency  Min 2X/week         Progress Toward Goals  OT Goals(current goals can now be found in the care plan section)  Progress towards OT goals: Not progressing toward goals - comment(increased WOB, lethargy)  Acute Rehab OT Goals OT Goal Formulation: With patient/family Time For Goal Achievement: 05/11/17 Potential to Achieve Goals: Fair  Plan Discharge plan remains appropriate    Co-evaluation    PT/OT/SLP Co-Evaluation/Treatment: Yes Reason for Co-Treatment: For patient/therapist safety   OT goals addressed during session: Strengthening/ROM;ADL's and self-care      AM-PAC PT "6 Clicks" Daily Activity     Outcome Measure   Help from another person eating meals?: Total Help from another person taking care of personal grooming?: Total Help from another person toileting, which includes using toliet, bedpan, or urinal?: Total Help from another person bathing (including washing, rinsing, drying)?: Total Help from another person to put on and taking off regular upper body clothing?: Total Help from another person to put on and taking off regular lower body clothing?: Total 6 Click Score: 6    End of Session Equipment Utilized During Treatment: Gait belt  OT Visit Diagnosis: Unsteadiness on feet (R26.81);Other abnormalities of gait and mobility (R26.89);Muscle weakness (generalized) (M62.81);Other symptoms and signs involving cognitive function;Pain   Activity Tolerance Patient limited by lethargy   Patient Left in bed;with call bell/phone within reach;with bed alarm set;with nursing/sitter in room;with SCD's reapplied   Nurse Communication Other (comment)(lethargy, RR)        Time: 2956-2130 OT Time Calculation (min): 44 min  Charges: OT General Charges $OT Visit: 1 Visit OT Treatments $Therapeutic Activity: 23-37 mins  05/02/2017 Martie Round, OTR/L Pager: [phone]   Iran Planas [place] Bailiff 05/02/2017, 4:31 PM

## 2017-05-02 NOTE — Progress Notes (Signed)
Patient with increased RR 40s.  Bp 147/56  HR 69  O2 sats 99% on [place] Lung sounds decreased in bases.  Patient lethargic, awakes easily. Mild increased WOB.  Dr Isidoro Donningai at bedside.  Additional dose of Lasix given IV (20mg )  0.5 mg Morphine given IV.  Attempted IV start unsuccessful x 2 IV team consulted.  Plan CTA chest after 20ga IV placed.

## 2017-05-03 ENCOUNTER — Inpatient Hospital Stay (HOSPITAL_COMMUNITY): Payer: Medicare Other

## 2017-05-03 ENCOUNTER — Encounter (HOSPITAL_COMMUNITY): Payer: Self-pay | Admitting: Orthopedic Surgery

## 2017-05-03 HISTORY — PX: IR THORACENTESIS ASP PLEURAL SPACE W/IMG GUIDE: IMG5380

## 2017-05-03 LAB — CBC
HCT: 37.4 % (ref 36.0–46.0)
HCT: 36.8 % (ref 36.0–46.0)
HEMOGLOBIN: 9.7 g/dL — AB (ref 12.0–15.0)
Hemoglobin: 10.2 g/dL — ABNORMAL LOW (ref 12.0–15.0)
MCH: 24.9 pg — AB (ref 26.0–34.0)
MCH: 24.3 pg — AB (ref 26.0–34.0)
MCHC: 27.3 g/dL — AB (ref 30.0–36.0)
MCHC: 26.4 g/dL — AB (ref 30.0–36.0)
MCV: 91.4 fL (ref 78.0–100.0)
MCV: 92 fL (ref 78.0–100.0)
PLATELETS: 220 10*3/uL (ref 150–400)
Platelets: 135 10*3/uL — ABNORMAL LOW (ref 150–400)
RBC: 4.09 MIL/uL (ref 3.87–5.11)
RBC: 4 MIL/uL (ref 3.87–5.11)
RDW: 20.5 % — AB (ref 11.5–15.5)
RDW: 20.5 % — AB (ref 11.5–15.5)
WBC: 6.3 10*3/uL (ref 4.0–10.5)
WBC: 6.9 10*3/uL (ref 4.0–10.5)

## 2017-05-03 LAB — BASIC METABOLIC PANEL
Anion gap: 9 (ref 5–15)
BUN: 14 mg/dL (ref 6–20)
CO2: 43 mmol/L — AB (ref 22–32)
Calcium: 8.5 mg/dL — ABNORMAL LOW (ref 8.9–10.3)
Chloride: 93 mmol/L — ABNORMAL LOW (ref 101–111)
Creatinine, Ser: 0.6 mg/dL (ref 0.44–1.00)
GFR calc Af Amer: >60 mL/min (ref >60)
GLUCOSE: 87 mg/dL (ref 65–99)
POTASSIUM: 3.6 mmol/L (ref 3.5–5.1)
Sodium: 145 mmol/L (ref 135–145)

## 2017-05-03 MED ORDER — MORPHINE SULFATE (PF) 2 MG/ML IV SOLN
1.0000 mg | Freq: Once | INTRAVENOUS | Status: AC
  Administered 2017-05-03: 1 mg via INTRAVENOUS
  Filled 2017-05-03: qty 1

## 2017-05-03 MED ORDER — FUROSEMIDE 40 MG PO TABS
40.0000 mg | ORAL_TABLET | Freq: Every day | ORAL | Status: DC
  Administered 2017-05-03 – 2017-05-06 (×4): 40 mg via ORAL
  Filled 2017-05-03 (×4): qty 1

## 2017-05-03 MED ORDER — LIDOCAINE HCL 1 % IJ SOLN
INTRAMUSCULAR | Status: AC | PRN
  Administered 2017-05-03: 10 mL

## 2017-05-03 MED ORDER — LIDOCAINE HCL 1 % IJ SOLN
INTRAMUSCULAR | Status: AC
  Filled 2017-05-03: qty 20

## 2017-05-03 NOTE — Procedures (Signed)
PROCEDURE SUMMARY:  Successful US guided therapeutic right thoracentesis. Yielded 1.2 liters of yellow fluid. Pt tolerated procedure well. No immediate complications.  Specimen was not sent for labs. CXR ordered.  Hoyt KochKacie Sue-Ellen Matthews PA-C 05/03/2017 4:04 PM

## 2017-05-03 NOTE — Social Work (Signed)
CSW left new SNF packet with offers at bedside for pt and pt family.   Hand off left for weekend CSW to f/u with pt family regarding offers again.  Doy HutchingIsabel H Ladaisha [name], LCSWA [facility] Clinical Social Work [phone]

## 2017-05-03 NOTE — Anesthesia Postprocedure Evaluation (Signed)
Anesthesia Post Note  Patient: [name]  Procedure(s) Performed: INTRAMEDULLARY (IM) NAIL INTERTROCHANTRIC (Right )     Patient location during evaluation: PACU Anesthesia Type: MAC and Spinal Level of consciousness: obtunded/minimal responses Pain management: pain level controlled Vital Signs Assessment: vitals unstable Respiratory status: spontaneous breathing and respiratory function unstable Cardiovascular status: stable Postop Assessment: no apparent nausea or vomiting Anesthetic complications: no Comments: See chart    Last Vitals:  Vitals:   05/03/17 0537 05/03/17 0949  BP: 136/60 (!) 157/66  Pulse: 73 75  Resp: 16   Temp: 36.9 C   SpO2: 100%     Last Pain:  Vitals:   05/03/17 1040  TempSrc:   PainSc: 5                  Emerly Prak

## 2017-05-03 NOTE — Progress Notes (Signed)
Pt respirations 30 and pt appears restless. Other vital signs stable. Provider notified. Will continue to monitor and treat per Provider orders.

## 2017-05-03 NOTE — Progress Notes (Signed)
Progress Note  Patient Name: [name] Date of Encounter: 05/03/2017  Primary Cardiologist:   Olga Millers, MD   Subjective   She says that she gets anxious.  She denies acute SOB or pain.    Inpatient Medications    Scheduled Meds: . aspirin  81 mg Oral Daily  . atorvastatin  40 mg Oral q1800  . enalapril  20 mg Oral Daily  . enoxaparin (LOVENOX) injection  30 mg Subcutaneous Q24H  . feeding supplement (ENSURE ENLIVE)  237 mL Oral BID BM  . furosemide  40 mg Intravenous Daily  . mouth rinse  15 mL Mouth Rinse BID  . metoprolol tartrate  50 mg Per Tube BID  . ondansetron (ZOFRAN) IV  4 mg Intravenous Once  . potassium chloride  20 mEq Oral Daily   Continuous Infusions:  PRN Meds: acetaminophen, bisacodyl, docusate, iopamidol, polyethylene glycol   Vital Signs    Vitals:   05/02/17 2128 05/03/17 0100 05/03/17 0500 05/03/17 0537  BP: (!) 150/75 (!) 158/60  136/60  Pulse: 82 85  73  Resp: 18   16  Temp: 97.8 F (36.6 C)   98.4 F (36.9 C)  TempSrc:      SpO2: 100%   100%  Weight:   103 lb (46.7 kg)   Height:        Intake/Output Summary (Last 24 hours) at 05/03/2017 0930 Last data filed at 05/03/2017 0537 Gross per 24 hour  Intake -  Output 1700 ml  Net -1700 ml   Filed Weights   05/01/17 0833 05/02/17 0500 05/03/17 0500  Weight: 105 lb (47.6 kg) 101 lb (45.8 kg) 103 lb (46.7 kg)    Telemetry    NA - Personally Reviewed  ECG    NA - Personally Reviewed  Physical Exam   GEN: No  acute distress.  She is very frail.  Very difficult to move for this exam Neck: No  JVD Cardiac: RRR, apical systolic murmur early peaking, no diastolic murmurs, rubs, or gallops.  Respiratory:   Decreased breath sounds right greater than left GI: Soft, nontender, non-distended, normal bowel sounds  MS:  Trace edema; No deformity. Neuro:   Nonfocal  Psych:   Confused but answers questions   Labs    Chemistry Recent Labs  Lab 05/01/17 0434 05/02/17 0348  05/03/17 0712  NA 141 143 145  K 3.7 3.6 3.6  CL 98* 95* 93*  CO2 38* 38* 43*  GLUCOSE 128* 85 87  BUN 16 14 14   CREATININE 0.58 0.60 0.60  CALCIUM 8.6* 8.4* 8.5*  GFRNONAA >60 >60 >60  GFRAA >60 >60 >60  ANIONGAP 5 10 9      Hematology Recent Labs  Lab 05/01/17 0434 05/02/17 0348 05/03/17 0712  WBC 6.3 8.0 6.3  RBC 3.77* 3.98 4.09  HGB 9.3* 9.6* 10.2*  HCT 34.0* 36.1 37.4  MCV 90.2 90.7 91.4  MCH 24.7* 24.1* 24.9*  MCHC 27.4* 26.6* 27.3*  RDW 19.8* 19.5* 20.5*  PLT 180 169 135*    Cardiac Enzymes Recent Labs  Lab 04/26/17 1302 04/26/17 1653 04/26/17 2255  TROPONINI 1.53* 1.27* 1.06*   No results for input(s): TROPIPOC in the last 168 hours.   BNP Recent Labs  Lab 05/01/17 1128  BNP 2,446.5*     DDimer No results for input(s): DDIMER in the last 168 hours.   Radiology    Ct Angio Chest Pe W Or Wo Contrast  Result Date: 05/02/2017 CLINICAL DATA:  84  y/o F; Shortness of breath PE suspected, high pretest prob. EXAM: CT ANGIOGRAPHY CHEST WITH CONTRAST TECHNIQUE: Multidetector CT imaging of the chest was performed using the standard protocol during bolus administration of intravenous contrast. Multiplanar CT image reconstructions and MIPs were obtained to evaluate the vascular anatomy. CONTRAST:  70mL ISOVUE-370 IOPAMIDOL (ISOVUE-370) INJECTION 76% COMPARISON:  05/01/2017 chest radiograph FINDINGS: Cardiovascular: Severe calcific atherosclerosis of the aorta and coronary arteries. Status post CABG with LIMA and saphenous grafts. Normal caliber thoracic aorta and main pulmonary artery. Mild cardiomegaly. Mitral valve replacement. No pericardial effusion. Satisfactory opacification of the pulmonary arteries. No pulmonary embolus identified. Mediastinum/Nodes: No enlarged mediastinal, hilar, or axillary lymph nodes. Thyroid gland, trachea, and esophagus demonstrate no significant findings. Lungs/Pleura: Large right and moderate left pleural effusion with fluid tracking  along the fissures. Near complete collapse of the right lower lobe and dependent atelectasis of the left lower lobe. Centrilobular emphysema greatest in the lung apices. No pneumothorax. Upper Abdomen: Healed median sternotomy chronic postsurgical changes. No acute osseous abnormality is identified. Musculoskeletal: No chest wall abnormality. No acute or significant osseous findings. Review of the MIP images confirms the above findings. IMPRESSION: 1. No pulmonary embolus identified. 2. Large right and moderate left pleural effusions. 3. Near complete collapse of right lower lobe and dependent atelectasis in left lower lobe. 4. Severe calcific atherosclerosis of the aorta and coronary arteries. 5. Cardiomegaly. 6. Emphysema. Electronically Signed   By: Mitzi Hansen M.D.   On: 05/02/2017 17:11   Dg Chest Port 1 View  Result Date: 05/01/2017 CLINICAL DATA:  Hypertension.  Dementia.  Congestive heart failure. EXAM: PORTABLE CHEST 1 VIEW COMPARISON:  04/30/2017 FINDINGS: Similar appearance. Large bilateral effusions with dependent atelectasis. Venous hypertension with mild pulmonary edema. IMPRESSION: No change since yesterday. Large effusions with dependent atelectasis. Mild edema. Electronically Signed   By: Paulina Fusi M.D.   On: 05/01/2017 11:49    Cardiac Studies   Echo 04/23/17: Study Conclusions  - Left ventricle: The cavity size was normal. Wall thickness was normal. Systolic function was normal. The estimated ejection fraction was in the range of 60% to 65%. Images were inadequate for LV wall motion assessment. - Aortic valve: There was trivial regurgitation. - Mitral valve: s/p annuloplasty repair. No significant stenosis. Mild regurgitation. Valve area by continuity equation (using LVOT flow): 1.68 cm^2. - Left atrium: The atrium was normal in size. - Right ventricle: The cavity size was mildly dilated. Moderately reduced RV systolic function. - Right atrium: The  atrium was normal in size. - Tricuspid valve: There was moderate regurgitation. - Pulmonary arteries: PA peak pressure: 75 mm Hg (S). - Inferior vena cava: The vessel was dilated. The respirophasic diameter changes were blunted (<50%), consistent with elevated central venous pressure. - Pericardium, extracardiac: Ascites is noted.   Patient Profile     84 y.o. female s/p CABG, MV repair, chronic diastolic heart failure, severe pulmonary hypertension, and dementia here with hip fracture s/p repair.  Hospital course complicated by respiratory failure and atrial fibrillation with RVR.    Assessment & Plan    PAF:  Off tele.    ACUTE ON CHRONIC DIASTOLIC HF:  Minus 1700 cc yesterday volume in/out.  Down 5.2 liters since admission.   She does have large right greater than left pleural effusion.  At this point I agree with thoracentesis.  I would suggest continued PO diuresis.    PULMONARY HTN:  No further work up.  Needs good pulmonary toilet.   ELEVATED TROPONIN:  No ischemia work up.    For questions or updates, please contact [facility] Please consult [email or web address] for contact info under Cardiology/STEMI.   Signed, Rollene RotundaJames Espen Bethel, MD  05/03/2017, 9:30 AM

## 2017-05-03 NOTE — Progress Notes (Signed)
[facility]                                                                              Patient Demographics  [name], is a 84 y.o. female, DOB - 05/08/1932, RUE:[id number]  Admit date - 04/22/2017   Admitting Physician Jonah BlueJennifer Yates, MD  Outpatient Primary MD for the patient is No primary care provider on file.  Outpatient specialists:   LOS - 11  days   Medical records reviewed and are as summarized below:    Chief Complaint  Patient presents with  . Fall  . Hypertension       Brief summary   Patient is a 84 year old female with history of chronic diastolic CHF, CAD status post MVR, CABG 2008, AAA, hypertension, hyperlipidemia, possible dementia who was admitted on 4/8 with a right intertrochanteric fracture after mechanical fall.  Patient underwent ORIF surgery on 4/11, developed postop lethargy and required intubation for worsening hypoxic respiratory failure, extubated on 4/13. [facility]  Assessment & Plan    Principal Problem:   Closed displaced intertrochanteric fracture of right femur (HCC) - Status post mechanical fall, ORIF on 4/11 - PT recommended skilled nursing facility for rehab pending medical stability  Active Problems: Acute respiratory failure with hypoxia with pulmonary edema, bilateral pleural effusion - Postop, developed lethargy, respiratory failure and was intubated.  Patient was extubated on 4/13. - On 4/17, patient had hypoxia, tachypnea, received IV Lasix, chest x-ray with bilateral pleural effusions, BNP elevated - Started on Lasix IV 40 mg daily, negative balance of 5.2 L -Afternoon oh 4/18, patient developed worsening dyspnea. CTA negative for PE but large right sided pleural effusion and moderate left -Ultrasound-guided right thoracentesis was ordered and underwent seen by IR on 4/19 yielding 1.2 L on the right. -Continue to monitor closely.  Bilateral pleural effusions, acute on  chronic diastolic CHF, severe pulmonary hypertension -Cardiology assisting with management. -Underwent ultrasound-guided right thoracentesis on 4/12 with 1 L of pleural fluid removed, fluid studies consistent with transudative pleural effusion.  Had another right-sided ultrasound-guided thoracentesis of 1.2 L on 4/19. -Peripherally she appears euvolemic.  -5.2 L since admission.  Agree with cardiology and transitioning to oral Lasix 40 mg daily.  Cardiology recommends no further workup for pulmonary hypertension or ischemia workup. -Metabolic alkalosis likely due to aggressive IV diuresis.  Now that Lasix being changed to p.o., follow BMP in a.m.  Elevated troponin - Likely due to acute respiratory failure and acute on chronic diastolic CHF - Cardiology following, no ischemia workup planned.  Acute encephalopathy superimposed on underlying dementia -Possibly worsened due to medications, hypercarbia, Sundowning -Improving, continue supportive care, limit sedating meds  Pulmonary hypertension -Continue current therapy, no further workup planned per cardiology  Essential hypertension -BP currently stable, continue beta-blocker, Lasix, enalapril.    Normocytic anemia -Postop hemoglobin down to 7.5, likely due to blood loss anemia, status post transfusion,  -H&H stable  Moderate malnutrition -Nutrition consult  Pressure injury of the skin -Per nursing flow sheet, right shoulder abrasion, foam dressing  Urinary retention Bladder ultrasound> 500cc, in & out cath on 4/17  Thrombocytopenia Unclear  etiology.  Follow CBC in a.m.  Code Status: Full CODE STATUS DVT Prophylaxis: Lovenox Family Communication: None at bedside. Disposition Plan: Skilled nursing facility pending medical improvement.  Time Spent in minutes 25 minutes  Procedures:  ORIF 4/11 Intubation Ultrasound-guided right thoracentesis by IR 4/12 and 4/19  Consultants:    Orthopedics PCCM Cardiology IR  Antimicrobials:   None   Medications  Scheduled Meds: . aspirin  81 mg Oral Daily  . atorvastatin  40 mg Oral q1800  . enalapril  20 mg Oral Daily  . enoxaparin (LOVENOX) injection  30 mg Subcutaneous Q24H  . feeding supplement (ENSURE ENLIVE)  237 mL Oral BID BM  . furosemide  40 mg Intravenous Daily  . furosemide  40 mg Oral Daily  . lidocaine      . mouth rinse  15 mL Mouth Rinse BID  . metoprolol tartrate  50 mg Per Tube BID  . ondansetron (ZOFRAN) IV  4 mg Intravenous Once  . potassium chloride  20 mEq Oral Daily   Continuous Infusions:  PRN Meds:.acetaminophen, bisacodyl, docusate, iopamidol, polyethylene glycol     Subjective:   Patient seen this morning prior to procedure.  Pleasantly confused.  Denied dyspnea although appeared slightly tachypneic.  No chest pain or cough reported.  Overnight events noted, tachypneic.  As per RN, no acute issues noted.  Objective:   Vitals:   05/03/17 0100 05/03/17 0500 05/03/17 0537 05/03/17 0949  BP: (!) 158/60  136/60 (!) 157/66  Pulse: 85  73 75  Resp:   16   Temp:   98.4 F (36.9 C)   TempSrc:      SpO2:   100%   Weight:  46.7 kg (103 lb)    Height:        Intake/Output Summary (Last 24 hours) at 05/03/2017 1659 Last data filed at 05/03/2017 0537 Gross per 24 hour  Intake -  Output 700 ml  Net -700 ml     Wt Readings from Last 3 Encounters:  05/03/17 46.7 kg (103 lb)  01/26/15 54.6 kg (120 lb 6.4 oz)  09/30/13 61.7 kg (136 lb)     Exam  General: Elderly female, small built, frail and chronically ill looking lying propped up in bed and mildly tachypneic. Cardiovascular: S1 and S2 heard, RRR.  No JVD, murmurs or pedal edema.  Not on telemetry. Respiratory: Reduced breath sounds both bases right greater than sign left.  No wheezing, rhonchi or crackles appreciated.  Rest of lung fields clear to auscultation.  Mild tachypnea. Gastrointestinal: Nondistended, soft and  nontender.  Normal bowel sounds heard. Ext: Moves all extremities normally. Neuro alert and oriented x2.  No focal neurological deficits. Psych: Pleasant affect.   Data Reviewed:  I have personally reviewed following labs and imaging studies  Micro Results Recent Results (from the past 240 hour(s))  Culture, body fluid-bottle     Status: None   Collection Time: 04/26/17  3:30 PM  Result Value Ref Range Status   Specimen Description PLEURAL RIGHT  Final   Special Requests NONE  Final   Culture   Final    NO GROWTH 5 DAYS Performed at [facility], [address] [place], [place] [address]    Report Status 05/01/2017 FINAL  Final  Gram stain     Status: None   Collection Time: 04/26/17  3:30 PM  Result Value Ref Range Status   Specimen Description PLEURAL RIGHT  Final   Special Requests NONE  Final  Gram Stain   Final    FEW WBC PRESENT,BOTH PMN AND MONONUCLEAR NO ORGANISMS SEEN Performed at [facility], [address] [place], [place] [address]    Report Status 04/26/2017 FINAL  Final    Radiology Reports Dg Chest 1 View  Result Date: 05/03/2017 CLINICAL DATA:  Status post right thoracentesis. EXAM: CHEST  1 VIEW COMPARISON:  05/02/2017 CT of the chest FINDINGS: Cardiac shadow remains enlarged. Postsurgical changes are again noted. Bilateral pleural effusions are noted left greater than right. The right effusion is significantly reduced when compared with the prior CT examination. No definitive pneumothorax is seen. IMPRESSION: Status post right thoracentesis without pneumothorax. Mild residual effusion remains. Stable left pleural effusion when compared with the prior study. Electronically Signed   By: Alcide Clever M.D.   On: 05/03/2017 16:15   Dg Chest 1 View  Result Date: 04/22/2017 CLINICAL DATA:  Preop evaluation for right hip surgery EXAM: CHEST  1 VIEW COMPARISON:  04/05/2008 FINDINGS: Cardiac shadow remains enlarged. Postsurgical changes are again seen.  Right-sided pleural effusion and atelectatic/infiltrative changes are noted. No pneumothorax is seen. No acute bony abnormality is noted. IMPRESSION: Right basilar atelectatic/infiltrative changes with associated effusion. Electronically Signed   By: Alcide Clever M.D.   On: 04/22/2017 11:56   Ct Angio Chest Pe W Or Wo Contrast  Result Date: 05/02/2017 CLINICAL DATA:  84 y/o F; Shortness of breath PE suspected, high pretest prob. EXAM: CT ANGIOGRAPHY CHEST WITH CONTRAST TECHNIQUE: Multidetector CT imaging of the chest was performed using the standard protocol during bolus administration of intravenous contrast. Multiplanar CT image reconstructions and MIPs were obtained to evaluate the vascular anatomy. CONTRAST:  70mL ISOVUE-370 IOPAMIDOL (ISOVUE-370) INJECTION 76% COMPARISON:  05/01/2017 chest radiograph FINDINGS: Cardiovascular: Severe calcific atherosclerosis of the aorta and coronary arteries. Status post CABG with LIMA and saphenous grafts. Normal caliber thoracic aorta and main pulmonary artery. Mild cardiomegaly. Mitral valve replacement. No pericardial effusion. Satisfactory opacification of the pulmonary arteries. No pulmonary embolus identified. Mediastinum/Nodes: No enlarged mediastinal, hilar, or axillary lymph nodes. Thyroid gland, trachea, and esophagus demonstrate no significant findings. Lungs/Pleura: Large right and moderate left pleural effusion with fluid tracking along the fissures. Near complete collapse of the right lower lobe and dependent atelectasis of the left lower lobe. Centrilobular emphysema greatest in the lung apices. No pneumothorax. Upper Abdomen: Healed median sternotomy chronic postsurgical changes. No acute osseous abnormality is identified. Musculoskeletal: No chest wall abnormality. No acute or significant osseous findings. Review of the MIP images confirms the above findings. IMPRESSION: 1. No pulmonary embolus identified. 2. Large right and moderate left pleural  effusions. 3. Near complete collapse of right lower lobe and dependent atelectasis in left lower lobe. 4. Severe calcific atherosclerosis of the aorta and coronary arteries. 5. Cardiomegaly. 6. Emphysema. Electronically Signed   By: Mitzi Hansen M.D.   On: 05/02/2017 17:11   Dg Chest Port 1 View  Result Date: 05/01/2017 CLINICAL DATA:  Hypertension.  Dementia.  Congestive heart failure. EXAM: PORTABLE CHEST 1 VIEW COMPARISON:  04/30/2017 FINDINGS: Similar appearance. Large bilateral effusions with dependent atelectasis. Venous hypertension with mild pulmonary edema. IMPRESSION: No change since yesterday. Large effusions with dependent atelectasis. Mild edema. Electronically Signed   By: Paulina Fusi M.D.   On: 05/01/2017 11:49   Dg Chest Port 1 View  Result Date: 04/30/2017 CLINICAL DATA:  Pleural effusion, shortness of breath EXAM: PORTABLE CHEST 1 VIEW COMPARISON:  04/26/2017 FINDINGS: Prior CABG. Cardiomegaly. Layering bilateral effusions have increased  since prior study. Increasing perihilar and lower lobe opacities and interstitial prominence, likely edema and superimposed areas of atelectasis. IMPRESSION: Increasing layering bilateral effusions, bilateral perihilar and lower lobe opacities and interstitial prominence. Findings likely reflect edema/CHF and superimposed areas of bibasilar atelectasis. Electronically Signed   By: Charlett Nose M.D.   On: 04/30/2017 07:55   Dg Chest Port 1 View  Result Date: 04/26/2017 CLINICAL DATA:  Post thoracentesis EXAM: PORTABLE CHEST 1 VIEW COMPARISON:  04/26/2017, 04/25/2017 FINDINGS: Support devices obscure the upper chest. Endotracheal tube tip is about 2.7 cm superior to the carina. Esophageal tube tip is below the diaphragm but non included. Post sternotomy changes and valve prosthesis. Decreased right greater than left pleural effusions. No definitive right pneumothorax. Improved aeration of the right lower lung. Residual consolidation at the  right middle lobe and right base. Mediastinal silhouette is exaggerated by patient rotation. Dense aortic atherosclerosis. IMPRESSION: 1. Decreased small bilateral right greater than left pleural effusions. No definitive right pneumothorax seen 2. Persistent airspace disease at the right middle lobe and right base, atelectasis versus pneumonia. Electronically Signed   By: Jasmine Pang M.D.   On: 04/26/2017 15:45   Portable Chest Xray  Result Date: 04/26/2017 CLINICAL DATA:  Endotracheal tube EXAM: PORTABLE CHEST 1 VIEW COMPARISON:  04/25/2017 FINDINGS: Cardiac shadow is stable. Postsurgical changes are again seen. Endotracheal tube and nasogastric catheter are noted in satisfactory position. Bilateral pleural effusions are again seen right greater than left. Underlying atelectasis is present. IMPRESSION: Stable bilateral pleural effusions and atelectasis. Electronically Signed   By: Alcide Clever M.D.   On: 04/26/2017 07:55   Dg Chest Port 1 View  Result Date: 04/25/2017 CLINICAL DATA:  Endotracheal tube present.  OG tube placement. EXAM: PORTABLE CHEST 1 VIEW COMPARISON:  Radiograph earlier this day at 0617 hour FINDINGS: Endotracheal tube 2.2 cm from the carina. Enteric tube in place with tip and side-port below the diaphragm in the stomach. Post median sternotomy. Unchanged cardiomegaly and mediastinal contours. Aortic atherosclerosis. Unchanged right pleural effusion and basilar airspace disease. Improved left pleural effusion. Improving bilateral airspace disease. No pneumothorax. The bones are under mineralized. IMPRESSION: 1. Endotracheal tube tip 2.2 cm from the carina. Tip and side port of the enteric tube below the diaphragm. 2. Bilateral pleural effusions, moderate on the right and small on the left, with improvement from exam earlier this day. 3. Improving aeration with improvement in bilateral airspace disease. Electronically Signed   By: Rubye Oaks M.D.   On: 04/25/2017 21:47   Dg Chest  Port 1 View  Result Date: 04/25/2017 CLINICAL DATA:  Shortness of Breath EXAM: PORTABLE CHEST 1 VIEW COMPARISON:  04/23/2017 FINDINGS: Changes of median sternotomy and valve replacement. Moderate to large right effusion is similar or slightly improved since prior study. Diffuse right lung airspace disease. Small to moderate left effusion, new since prior study with left lower lobe atelectasis or infiltrate. IMPRESSION: Moderate right effusion, slightly improved since prior study. New small to moderate left effusion. Bilateral airspace disease which could reflect atelectasis or pneumonia, right greater than left. Electronically Signed   By: Charlett Nose M.D.   On: 04/25/2017 08:59   Dg Chest Port 1 View  Result Date: 04/23/2017 CLINICAL DATA:  84 year old female with hypoxia. Subsequent encounter. EXAM: PORTABLE CHEST 1 VIEW COMPARISON:  04/22/2017 chest x-ray. FINDINGS: Post CABG and valve replacement.  Cardiomegaly. Progressive enlargement of right-sided pleural effusion now moderate to large in size with associated right lung atelectasis. Pulmonary vascular congestion. Medial  left base subsegmental atelectasis. Calcified tortuous aorta.  Prominent vascular calcifications. IMPRESSION: Progressive enlargement of right-sided pleural effusion now moderate to large in size with associated right lung atelectasis. Mild atelectasis medial aspect left lung base. Pulmonary vascular congestion. Cardiomegaly post CABG and valve replacement. Aortic Atherosclerosis (ICD10-I70.0). Electronically Signed   By: Lacy Duverney M.D.   On: 04/23/2017 08:45   Dg C-arm 1-60 Min  Result Date: 04/25/2017 CLINICAL DATA:  Right intertrochanteric femur fracture ORIF. EXAM: DG C-ARM 61-120 MIN; RIGHT FEMUR 2 VIEWS COMPARISON:  Right hip x-rays dated April 22, 2017. FINDINGS: Intraoperative x-rays demonstrate interval placement of a right femoral cephalomedullary rod with distal interlocking screw. Near anatomic alignment of the  dominant intertrochanteric fracture fragments. The lesser trochanter remains mildly displaced medially. Right hip osteoarthritis. Vascular calcifications. IMPRESSION: 1. Improved alignment of the right intertrochanteric femur fracture status post ORIF. FLUOROSCOPY TIME:  1 minutes, 37 seconds. C-arm fluoroscopic images were obtained intraoperatively and submitted for post operative interpretation. Electronically Signed   By: Obie Dredge M.D.   On: 04/25/2017 15:46   Dg Hip Unilat W Or Wo Pelvis 2-3 Views Right  Result Date: 04/22/2017 CLINICAL DATA:  Recent fall with right leg deformity EXAM: DG HIP (WITH OR WITHOUT PELVIS) 2-3V RIGHT COMPARISON:  None. FINDINGS: Pelvic ring is intact. Comminuted intratrochanteric fracture of the proximal right femur is noted with impaction and angulation at the fracture site. No soft tissue changes are seen. Diffuse vascular calcifications are noted. IMPRESSION: Comminuted intratrochanteric fracture of the proximal right femur. Electronically Signed   By: Alcide Clever M.D.   On: 04/22/2017 12:01   Dg Femur, Min 2 Views Right  Result Date: 04/25/2017 CLINICAL DATA:  Right intertrochanteric femur fracture ORIF. EXAM: DG C-ARM 61-120 MIN; RIGHT FEMUR 2 VIEWS COMPARISON:  Right hip x-rays dated April 22, 2017. FINDINGS: Intraoperative x-rays demonstrate interval placement of a right femoral cephalomedullary rod with distal interlocking screw. Near anatomic alignment of the dominant intertrochanteric fracture fragments. The lesser trochanter remains mildly displaced medially. Right hip osteoarthritis. Vascular calcifications. IMPRESSION: 1. Improved alignment of the right intertrochanteric femur fracture status post ORIF. FLUOROSCOPY TIME:  1 minutes, 37 seconds. C-arm fluoroscopic images were obtained intraoperatively and submitted for post operative interpretation. Electronically Signed   By: Obie Dredge M.D.   On: 04/25/2017 15:46   Ir Thoracentesis Asp Pleural Space  W/img Guide  Result Date: 05/03/2017 INDICATION: Patient with shortness of breath, respiratory failure. Now with recurrent right pleural effusion. Request is made for therapeutic thoracentesis. EXAM: ULTRASOUND GUIDED THERAPEUTIC RIGHT THORACENTESIS MEDICATIONS: 10 mL 1% lidocaine COMPLICATIONS: None immediate. PROCEDURE: An ultrasound guided thoracentesis was thoroughly discussed with the patient and questions answered. The benefits, risks, alternatives and complications were also discussed. The patient understands and wishes to proceed with the procedure. Written consent was obtained. Ultrasound was performed to localize and mark an adequate pocket of fluid in the right chest. The area was then prepped and draped in the normal sterile fashion. 1% Lidocaine was used for local anesthesia. Under ultrasound guidance a 19 gauge, 7-cm, Yueh catheter was introduced. Thoracentesis was performed. The catheter was removed and a dressing applied. FINDINGS: A total of approximately 1.2 liters of yellow fluid was removed. Samples were sent to the laboratory as requested by the clinical team. IMPRESSION: Successful ultrasound guided therapeutic right thoracentesis yielding 1.2 liters of pleural fluid. Read by: Loyce Dys PA-C Electronically Signed   By: Malachy Moan M.D.   On: 05/03/2017 16:16   US Thoracentesis Asp  Pleural Space W/img Guide  Result Date: 04/26/2017 INDICATION: Shortness of breath. Respiratory failure. Right-sided pleural effusion. Request diagnostic and therapeutic thoracentesis. EXAM: ULTRASOUND GUIDED RIGHT THORACENTESIS MEDICATIONS: None. COMPLICATIONS: None immediate. PROCEDURE: An ultrasound guided thoracentesis was thoroughly discussed with the patient and questions answered. The benefits, risks, alternatives and complications were also discussed. The patient understands and wishes to proceed with the procedure. Written consent was obtained. Ultrasound was performed to localize and mark an  adequate pocket of fluid in the right chest. The area was then prepped and draped in the normal sterile fashion. 1% Lidocaine was used for local anesthesia. Under ultrasound guidance a Safe-T-Centesis catheter was introduced. Thoracentesis was performed. The catheter was removed and a dressing applied. FINDINGS: A total of approximately 1 L of clear yellow fluid was removed. Samples were sent to the laboratory as requested by the clinical team. IMPRESSION: Successful ultrasound guided right thoracentesis yielding 1 L of pleural fluid. Read by: Brayton El PA-C Electronically Signed   By: Gilmer Mor D.O.   On: 04/26/2017 15:47    Lab Data:  CBC: Recent Labs  Lab 04/27/17 0504 04/28/17 0612 04/29/17 0541 05/01/17 0434 05/02/17 0348 05/03/17 0712  WBC 6.8 5.7 6.7 6.3 8.0 6.3  NEUTROABS 5.7 4.6 5.4  --   --   --   HGB 8.6* 7.9* 9.5* 9.3* 9.6* 10.2*  HCT 29.7* 28.0* 33.8* 34.0* 36.1 37.4  MCV 81.8 84.1 87.1 90.2 90.7 91.4  PLT 110* 113* 167 180 169 135*   Basic Metabolic Panel: Recent Labs  Lab 04/28/17 0612 04/29/17 0541 05/01/17 0434 05/02/17 0348 05/03/17 0712  NA 141 141 141 143 145  K 3.8 4.1 3.7 3.6 3.6  CL 104 101 98* 95* 93*  CO2 31 31 38* 38* 43*  GLUCOSE 76 79 128* 85 87  BUN 22* 19 16 14 14   CREATININE 0.51 0.51 0.58 0.60 0.60  CALCIUM 7.9* 8.5* 8.6* 8.4* 8.5*   GFR: Estimated Creatinine Clearance: 40 mL/min (by C-G formula based on SCr of 0.6 mg/dL).  Cardiac Enzymes: Recent Labs  Lab 04/26/17 2255  TROPONINI 1.06*   CBG: Recent Labs  Lab 04/27/17 1513 04/27/17 2019 04/28/17 0022 04/28/17 0415 04/28/17 0758  GLUCAP 113* 80 86 73 74   Urine analysis:    Component Value Date/Time   COLORURINE YELLOW 04/25/2017 2229   APPEARANCEUR CLEAR 04/25/2017 2229   LABSPEC 1.010 04/25/2017 2229   PHURINE 5.0 04/25/2017 2229   GLUCOSEU NEGATIVE 04/25/2017 2229   HGBUR SMALL (A) 04/25/2017 2229   BILIRUBINUR NEGATIVE 04/25/2017 2229   KETONESUR 5 (A)  04/25/2017 2229   PROTEINUR NEGATIVE 04/25/2017 2229   UROBILINOGEN 0.2 04/05/2008 0629   NITRITE NEGATIVE 04/25/2017 2229   LEUKOCYTESUR NEGATIVE 04/25/2017 2229     Marcellus Scott, MD, FACP, [facility]. [facility] Pager [phone]  If 7PM-7AM, please contact night-coverage [email or web address] Password [email or web address] 05/03/2017, 5:11 PM

## 2017-05-04 DIAGNOSIS — I509 Heart failure, unspecified: Secondary | ICD-10-CM

## 2017-05-04 LAB — BASIC METABOLIC PANEL
ANION GAP: 13 (ref 5–15)
Anion gap: 14 (ref 5–15)
BUN: 20 mg/dL (ref 6–20)
BUN: 19 mg/dL (ref 6–20)
CALCIUM: 8.7 mg/dL — AB (ref 8.9–10.3)
CO2: 37 mmol/L — ABNORMAL HIGH (ref 22–32)
CO2: 38 mmol/L — AB (ref 22–32)
Calcium: 8.7 mg/dL — ABNORMAL LOW (ref 8.9–10.3)
Chloride: 92 mmol/L — ABNORMAL LOW (ref 101–111)
Chloride: 90 mmol/L — ABNORMAL LOW (ref 101–111)
Creatinine, Ser: 0.65 mg/dL (ref 0.44–1.00)
Creatinine, Ser: 0.68 mg/dL (ref 0.44–1.00)
GFR calc Af Amer: >60 mL/min (ref >60)
GLUCOSE: 54 mg/dL — AB (ref 65–99)
GLUCOSE: 106 mg/dL — AB (ref 65–99)
POTASSIUM: 4.4 mmol/L (ref 3.5–5.1)
Potassium: 3.6 mmol/L (ref 3.5–5.1)
SODIUM: 142 mmol/L (ref 135–145)
Sodium: 142 mmol/L (ref 135–145)

## 2017-05-04 LAB — GLUCOSE, CAPILLARY
GLUCOSE-CAPILLARY: 44 mg/dL — AB (ref 65–99)
Glucose-Capillary: 46 mg/dL — ABNORMAL LOW (ref 65–99)

## 2017-05-04 NOTE — Progress Notes (Signed)
Hypoglycemic Event  CBG: 44  Treatment: 15 GM carbohydrate snack  Symptoms: Pale  Follow-up CBG: Time:1024 CBG Result:46  Possible Reasons for Event: Inadequate meal intake  Comments/MD notified: Informed Dr. Waymon AmatoHongalgi that's patients CBG was 44 and patient was given 4 OZ juice and was sat up to eat breakfast. Informed MD that patient eat 50% of breakfast as well as 1/2 pudding cup and another 4 OZ juice. Informed MD that CBG is 46 but patient is more alert and mentating per baseline. MD order stat BMP to recheck blood glucose level.    [place] Lainie Daubert

## 2017-05-04 NOTE — Progress Notes (Signed)
   Progress Note  Patient Name: Regan Rakersnn B Vassell Date of Encounter: 05/04/2017  Primary Cardiologist: Dr. Olga MillersBrian Crenshaw  Reviewed rounding note by Dr. Antoine PocheHochrein from April 19 as well as subsequent hospital course.  Patient underwent right sided thoracentesis with removal of 1.2 L.  Follow-up chest x-ray shows mild residual right-sided pleural effusion and stable left-sided pleural effusion in comparison to previous study.  She remains on stable cardiac regimen which now includes aspirin, Lipitor, Vasotec, oral Lasix at 40 mg daily, potassium supplements, and Lopressor.  No longer on telemetry.  Net output greater than intake by approximately 800 cc last 24 hours.  Echocardiogram reveals LVEF 60-65%, moderate tricuspid regurgitation, and severe pulmonary hypertension with PASP 75 mmHg.  No further inpatient cardiac testing is planned at this time.  Continue with present cardiac regimen.  We will sign off.  Signed, Nona DellSamuel McDowell, MD  05/04/2017, 11:52 AM

## 2017-05-04 NOTE — Progress Notes (Signed)
[facility]                                                                              Patient Demographics  [name], is a 84 y.o. female, DOB - 1932-07-08, ZOX:[id number]  Admit date - 04/22/2017   Admitting Physician Jonah Blue, MD  Outpatient Primary MD for the patient is No primary care provider on file.  Outpatient specialists:   LOS - 12  days   Medical records reviewed and are as summarized below:    Chief Complaint  Patient presents with  . Fall  . Hypertension       Brief summary   Patient is a 84 year old female with history of chronic diastolic CHF, CAD status post MVR, CABG 2008, AAA, hypertension, hyperlipidemia, possible dementia who was admitted on 4/8 with a right intertrochanteric fracture after mechanical fall.  Patient underwent ORIF surgery on 4/11, developed postop lethargy and required intubation for worsening hypoxic respiratory failure, extubated on 4/13. Transferred to [facility] hospitalist service on 04/30/17.  Hospital course complicated by recurrent pleural effusion, status post right thoracentesis 4/19.  Slowly improving.    Assessment & Plan    Principal Problem:   Closed displaced intertrochanteric fracture of right femur (HCC) - Status post mechanical fall, ORIF on 4/11 - PT recommended skilled nursing facility for rehab pending medical stability  Active Problems: Acute respiratory failure with hypoxia with pulmonary edema, bilateral pleural effusion - Postop, developed lethargy, respiratory failure and was intubated.  Patient was extubated on 4/13. - On 4/17, patient had hypoxia, tachypnea, received IV Lasix, chest x-ray with bilateral pleural effusions, BNP elevated - Started on Lasix IV 40 mg daily, negative balance of 5.2 L -Afternoon oh 4/18, patient developed worsening dyspnea. CTA negative for PE but large right sided pleural effusion and moderate left -Ultrasound-guided right thoracentesis was ordered and underwent  seen by IR on 4/19 yielding 1.2 L on the right. -Respiratory status improved.  Not in respiratory distress. -Suspect her bilateral pleural effusions are related to CHF and pulmonary hypertension.  At risk for recurrent pleural effusions.  Discussed with son in detail.  Bilateral pleural effusions, acute on chronic diastolic CHF, severe pulmonary hypertension -Cardiology assisted with management. -Underwent ultrasound-guided right thoracentesis on 4/12 with 1 L of pleural fluid removed, fluid studies consistent with transudative pleural effusion.  Had another right-sided ultrasound-guided thoracentesis of 1.2 L on 4/19. -Peripherally she appears euvolemic.  -5.9 L since admission.  Agree with cardiology and transitioning to oral Lasix 40 mg daily.  Cardiology recommends no further workup for pulmonary hypertension or ischemia workup.  Cardiology signed off 4/20. -Continue oral Lasix 40 mg daily.  Elevated troponin - Likely due to acute respiratory failure and acute on chronic diastolic CHF - Cardiology signed off, no ischemia workup planned.  Acute encephalopathy superimposed on underlying dementia -Possibly worsened due to medications, hypercarbia, Sundowning -Improving, continue supportive care, limit sedating meds.  As per discussion with son, mental status close to recent baseline.  Pulmonary hypertension -Continue current therapy, no further workup planned per cardiology  Essential hypertension -BP currently stable, continue beta-blocker, Lasix, enalapril.    Normocytic anemia -Postop  hemoglobin down to 7.5, likely due to blood loss anemia, status post transfusion,  -H&H stable  Moderate malnutrition -Nutrition consult  Pressure injury of the skin -Per nursing flow sheet, right shoulder abrasion, foam dressing  Urinary retention Bladder ultrasound> 500cc, in & out cath on 4/17  Thrombocytopenia Noted on 4/19: 134 was likely lab error.  Normal today.  Hypoglycemia: Noted  glucose of 54 on a.m. BMP.  RN followed up with RN juice, breakfast and multiple repeat CBGs continue to show low at 44 > 46.  Concerned that she may have poor peripheral perfusion hence error.  Repeated BMP stat, glucose 106.  Would repeat BMP if she is symptomatic of hypoglycemia.  Code Status: Full CODE STATUS DVT Prophylaxis: Lovenox Family Communication: Discussed in detail with patient's son.  Updated care and answered questions. Disposition Plan: Possible DC to SNF in the next 24 hours pending continued clinical improvement/stability and bed availability.  Discussed with clinical social work.  Time Spent in minutes 25 minutes  Procedures:  ORIF 4/11 Intubation Ultrasound-guided right thoracentesis by IR 4/12 and 4/19  Consultants:   Orthopedics PCCM Cardiology IR  Antimicrobials:   None   Medications  Scheduled Meds: . aspirin  81 mg Oral Daily  . atorvastatin  40 mg Oral q1800  . enalapril  20 mg Oral Daily  . enoxaparin (LOVENOX) injection  30 mg Subcutaneous Q24H  . feeding supplement (ENSURE ENLIVE)  237 mL Oral BID BM  . furosemide  40 mg Oral Daily  . mouth rinse  15 mL Mouth Rinse BID  . metoprolol tartrate  50 mg Per Tube BID  . potassium chloride  20 mEq Oral Daily   Continuous Infusions:  PRN Meds:.acetaminophen, bisacodyl, docusate, iopamidol, polyethylene glycol     Subjective:   Patient denies dyspnea.  Denies pain.  As per RN, no acute issues noted.  As per son, mental status close to recent baseline.  He indicates that she is much better than she was 2 days ago.  Objective:   Vitals:   05/03/17 2204 05/04/17 0500 05/04/17 0544 05/04/17 1339  BP: 138/81  (!) 155/66 (!) 130/54  Pulse: 76  76 68  Resp: 16  18 20   Temp: 98.2 F (36.8 C)  98 F (36.7 C) 98.2 F (36.8 C)  TempSrc: Oral  Oral Oral  SpO2: 95%  100% (!) 73%  Weight:  47.6 kg (105 lb)    Height:        Intake/Output Summary (Last 24 hours) at 05/04/2017 1440 Last data  filed at 05/04/2017 0956 Gross per 24 hour  Intake 240 ml  Output 850 ml  Net -610 ml     Wt Readings from Last 3 Encounters:  05/04/17 47.6 kg (105 lb)  01/26/15 54.6 kg (120 lb 6.4 oz)  09/30/13 61.7 kg (136 lb)     Exam  General: Elderly female, small built, frail and chronically ill looking lying comfortably supine in bed. Cardiovascular: S1 and S2 heard, RRR.  No JVD, murmurs or pedal edema.  Not on telemetry. Respiratory: Improved breath sounds.  Still diminished in the bases but better compared to 2 days ago.  Rest of lung fields clear to auscultation.  No increased work of breathing. Gastrointestinal: Nondistended, soft and nontender.  Normal bowel sounds heard.  Stable. Ext: Moves all extremities normally. Neuro alert and oriented x2.  No focal neurological deficits. Psych: Pleasant affect.   Data Reviewed:  I have personally reviewed following labs and imaging studies  Micro Results Recent Results (from the past 240 hour(s))  Culture, body fluid-bottle     Status: None   Collection Time: 04/26/17  3:30 PM  Result Value Ref Range Status   Specimen Description PLEURAL RIGHT  Final   Special Requests NONE  Final   Culture   Final    NO GROWTH 5 DAYS Performed at [facility], [address] [place], [place] [address]    Report Status 05/01/2017 FINAL  Final  Gram stain     Status: None   Collection Time: 04/26/17  3:30 PM  Result Value Ref Range Status   Specimen Description PLEURAL RIGHT  Final   Special Requests NONE  Final   Gram Stain   Final    FEW WBC PRESENT,BOTH PMN AND MONONUCLEAR NO ORGANISMS SEEN Performed at [facility], [address] [place], [place] [address]    Report Status 04/26/2017 FINAL  Final    Radiology Reports Dg Chest 1 View  Result Date: 05/03/2017 CLINICAL DATA:  Status post right thoracentesis. EXAM: CHEST  1 VIEW COMPARISON:  05/02/2017 CT of the chest FINDINGS: Cardiac shadow remains enlarged. Postsurgical  changes are again noted. Bilateral pleural effusions are noted left greater than right. The right effusion is significantly reduced when compared with the prior CT examination. No definitive pneumothorax is seen. IMPRESSION: Status post right thoracentesis without pneumothorax. Mild residual effusion remains. Stable left pleural effusion when compared with the prior study. Electronically Signed   By: Alcide CleverMark  Lukens M.D.   On: 05/03/2017 16:15   Dg Chest 1 View  Result Date: 04/22/2017 CLINICAL DATA:  Preop evaluation for right hip surgery EXAM: CHEST  1 VIEW COMPARISON:  04/05/2008 FINDINGS: Cardiac shadow remains enlarged. Postsurgical changes are again seen. Right-sided pleural effusion and atelectatic/infiltrative changes are noted. No pneumothorax is seen. No acute bony abnormality is noted. IMPRESSION: Right basilar atelectatic/infiltrative changes with associated effusion. Electronically Signed   By: Alcide CleverMark  Lukens M.D.   On: 04/22/2017 11:56   Ct Angio Chest Pe W Or Wo Contrast  Result Date: 05/02/2017 CLINICAL DATA:  84 y/o F; Shortness of breath PE suspected, high pretest prob. EXAM: CT ANGIOGRAPHY CHEST WITH CONTRAST TECHNIQUE: Multidetector CT imaging of the chest was performed using the standard protocol during bolus administration of intravenous contrast. Multiplanar CT image reconstructions and MIPs were obtained to evaluate the vascular anatomy. CONTRAST:  70mL ISOVUE-370 IOPAMIDOL (ISOVUE-370) INJECTION 76% COMPARISON:  05/01/2017 chest radiograph FINDINGS: Cardiovascular: Severe calcific atherosclerosis of the aorta and coronary arteries. Status post CABG with LIMA and saphenous grafts. Normal caliber thoracic aorta and main pulmonary artery. Mild cardiomegaly. Mitral valve replacement. No pericardial effusion. Satisfactory opacification of the pulmonary arteries. No pulmonary embolus identified. Mediastinum/Nodes: No enlarged mediastinal, hilar, or axillary lymph nodes. Thyroid gland, trachea,  and esophagus demonstrate no significant findings. Lungs/Pleura: Large right and moderate left pleural effusion with fluid tracking along the fissures. Near complete collapse of the right lower lobe and dependent atelectasis of the left lower lobe. Centrilobular emphysema greatest in the lung apices. No pneumothorax. Upper Abdomen: Healed median sternotomy chronic postsurgical changes. No acute osseous abnormality is identified. Musculoskeletal: No chest wall abnormality. No acute or significant osseous findings. Review of the MIP images confirms the above findings. IMPRESSION: 1. No pulmonary embolus identified. 2. Large right and moderate left pleural effusions. 3. Near complete collapse of right lower lobe and dependent atelectasis in left lower lobe. 4. Severe calcific atherosclerosis of the aorta and coronary arteries. 5.  Cardiomegaly. 6. Emphysema. Electronically Signed   By: Mitzi Hansen M.D.   On: 05/02/2017 17:11   Dg Chest Port 1 View  Result Date: 05/01/2017 CLINICAL DATA:  Hypertension.  Dementia.  Congestive heart failure. EXAM: PORTABLE CHEST 1 VIEW COMPARISON:  04/30/2017 FINDINGS: Similar appearance. Large bilateral effusions with dependent atelectasis. Venous hypertension with mild pulmonary edema. IMPRESSION: No change since yesterday. Large effusions with dependent atelectasis. Mild edema. Electronically Signed   By: Paulina Fusi M.D.   On: 05/01/2017 11:49   Dg Chest Port 1 View  Result Date: 04/30/2017 CLINICAL DATA:  Pleural effusion, shortness of breath EXAM: PORTABLE CHEST 1 VIEW COMPARISON:  04/26/2017 FINDINGS: Prior CABG. Cardiomegaly. Layering bilateral effusions have increased since prior study. Increasing perihilar and lower lobe opacities and interstitial prominence, likely edema and superimposed areas of atelectasis. IMPRESSION: Increasing layering bilateral effusions, bilateral perihilar and lower lobe opacities and interstitial prominence. Findings likely reflect  edema/CHF and superimposed areas of bibasilar atelectasis. Electronically Signed   By: Charlett Nose M.D.   On: 04/30/2017 07:55   Dg Chest Port 1 View  Result Date: 04/26/2017 CLINICAL DATA:  Post thoracentesis EXAM: PORTABLE CHEST 1 VIEW COMPARISON:  04/26/2017, 04/25/2017 FINDINGS: Support devices obscure the upper chest. Endotracheal tube tip is about 2.7 cm superior to the carina. Esophageal tube tip is below the diaphragm but non included. Post sternotomy changes and valve prosthesis. Decreased right greater than left pleural effusions. No definitive right pneumothorax. Improved aeration of the right lower lung. Residual consolidation at the right middle lobe and right base. Mediastinal silhouette is exaggerated by patient rotation. Dense aortic atherosclerosis. IMPRESSION: 1. Decreased small bilateral right greater than left pleural effusions. No definitive right pneumothorax seen 2. Persistent airspace disease at the right middle lobe and right base, atelectasis versus pneumonia. Electronically Signed   By: Jasmine Pang M.D.   On: 04/26/2017 15:45   Portable Chest Xray  Result Date: 04/26/2017 CLINICAL DATA:  Endotracheal tube EXAM: PORTABLE CHEST 1 VIEW COMPARISON:  04/25/2017 FINDINGS: Cardiac shadow is stable. Postsurgical changes are again seen. Endotracheal tube and nasogastric catheter are noted in satisfactory position. Bilateral pleural effusions are again seen right greater than left. Underlying atelectasis is present. IMPRESSION: Stable bilateral pleural effusions and atelectasis. Electronically Signed   By: Alcide Clever M.D.   On: 04/26/2017 07:55   Dg Chest Port 1 View  Result Date: 04/25/2017 CLINICAL DATA:  Endotracheal tube present.  OG tube placement. EXAM: PORTABLE CHEST 1 VIEW COMPARISON:  Radiograph earlier this day at 0617 hour FINDINGS: Endotracheal tube 2.2 cm from the carina. Enteric tube in place with tip and side-port below the diaphragm in the stomach. Post median  sternotomy. Unchanged cardiomegaly and mediastinal contours. Aortic atherosclerosis. Unchanged right pleural effusion and basilar airspace disease. Improved left pleural effusion. Improving bilateral airspace disease. No pneumothorax. The bones are under mineralized. IMPRESSION: 1. Endotracheal tube tip 2.2 cm from the carina. Tip and side port of the enteric tube below the diaphragm. 2. Bilateral pleural effusions, moderate on the right and small on the left, with improvement from exam earlier this day. 3. Improving aeration with improvement in bilateral airspace disease. Electronically Signed   By: Rubye Oaks M.D.   On: 04/25/2017 21:47   Dg Chest Port 1 View  Result Date: 04/25/2017 CLINICAL DATA:  Shortness of Breath EXAM: PORTABLE CHEST 1 VIEW COMPARISON:  04/23/2017 FINDINGS: Changes of median sternotomy and valve replacement. Moderate to large right effusion is similar or slightly improved since prior  study. Diffuse right lung airspace disease. Small to moderate left effusion, new since prior study with left lower lobe atelectasis or infiltrate. IMPRESSION: Moderate right effusion, slightly improved since prior study. New small to moderate left effusion. Bilateral airspace disease which could reflect atelectasis or pneumonia, right greater than left. Electronically Signed   By: Charlett Nose M.D.   On: 04/25/2017 08:59   Dg Chest Port 1 View  Result Date: 04/23/2017 CLINICAL DATA:  84 year old female with hypoxia. Subsequent encounter. EXAM: PORTABLE CHEST 1 VIEW COMPARISON:  04/22/2017 chest x-ray. FINDINGS: Post CABG and valve replacement.  Cardiomegaly. Progressive enlargement of right-sided pleural effusion now moderate to large in size with associated right lung atelectasis. Pulmonary vascular congestion. Medial left base subsegmental atelectasis. Calcified tortuous aorta.  Prominent vascular calcifications. IMPRESSION: Progressive enlargement of right-sided pleural effusion now moderate to  large in size with associated right lung atelectasis. Mild atelectasis medial aspect left lung base. Pulmonary vascular congestion. Cardiomegaly post CABG and valve replacement. Aortic Atherosclerosis (ICD10-I70.0). Electronically Signed   By: Lacy Duverney M.D.   On: 04/23/2017 08:45   Dg C-arm 1-60 Min  Result Date: 04/25/2017 CLINICAL DATA:  Right intertrochanteric femur fracture ORIF. EXAM: DG C-ARM 61-120 MIN; RIGHT FEMUR 2 VIEWS COMPARISON:  Right hip x-rays dated April 22, 2017. FINDINGS: Intraoperative x-rays demonstrate interval placement of a right femoral cephalomedullary rod with distal interlocking screw. Near anatomic alignment of the dominant intertrochanteric fracture fragments. The lesser trochanter remains mildly displaced medially. Right hip osteoarthritis. Vascular calcifications. IMPRESSION: 1. Improved alignment of the right intertrochanteric femur fracture status post ORIF. FLUOROSCOPY TIME:  1 minutes, 37 seconds. C-arm fluoroscopic images were obtained intraoperatively and submitted for post operative interpretation. Electronically Signed   By: Obie Dredge M.D.   On: 04/25/2017 15:46   Dg Hip Unilat W Or Wo Pelvis 2-3 Views Right  Result Date: 04/22/2017 CLINICAL DATA:  Recent fall with right leg deformity EXAM: DG HIP (WITH OR WITHOUT PELVIS) 2-3V RIGHT COMPARISON:  None. FINDINGS: Pelvic ring is intact. Comminuted intratrochanteric fracture of the proximal right femur is noted with impaction and angulation at the fracture site. No soft tissue changes are seen. Diffuse vascular calcifications are noted. IMPRESSION: Comminuted intratrochanteric fracture of the proximal right femur. Electronically Signed   By: Alcide Clever M.D.   On: 04/22/2017 12:01   Dg Femur, Min 2 Views Right  Result Date: 04/25/2017 CLINICAL DATA:  Right intertrochanteric femur fracture ORIF. EXAM: DG C-ARM 61-120 MIN; RIGHT FEMUR 2 VIEWS COMPARISON:  Right hip x-rays dated April 22, 2017. FINDINGS:  Intraoperative x-rays demonstrate interval placement of a right femoral cephalomedullary rod with distal interlocking screw. Near anatomic alignment of the dominant intertrochanteric fracture fragments. The lesser trochanter remains mildly displaced medially. Right hip osteoarthritis. Vascular calcifications. IMPRESSION: 1. Improved alignment of the right intertrochanteric femur fracture status post ORIF. FLUOROSCOPY TIME:  1 minutes, 37 seconds. C-arm fluoroscopic images were obtained intraoperatively and submitted for post operative interpretation. Electronically Signed   By: Obie Dredge M.D.   On: 04/25/2017 15:46   Ir Thoracentesis Asp Pleural Space W/img Guide  Result Date: 05/03/2017 INDICATION: Patient with shortness of breath, respiratory failure. Now with recurrent right pleural effusion. Request is made for therapeutic thoracentesis. EXAM: ULTRASOUND GUIDED THERAPEUTIC RIGHT THORACENTESIS MEDICATIONS: 10 mL 1% lidocaine COMPLICATIONS: None immediate. PROCEDURE: An ultrasound guided thoracentesis was thoroughly discussed with the patient and questions answered. The benefits, risks, alternatives and complications were also discussed. The patient understands and wishes to proceed with the  procedure. Written consent was obtained. Ultrasound was performed to localize and mark an adequate pocket of fluid in the right chest. The area was then prepped and draped in the normal sterile fashion. 1% Lidocaine was used for local anesthesia. Under ultrasound guidance a 19 gauge, 7-cm, Yueh catheter was introduced. Thoracentesis was performed. The catheter was removed and a dressing applied. FINDINGS: A total of approximately 1.2 liters of yellow fluid was removed. Samples were sent to the laboratory as requested by the clinical team. IMPRESSION: Successful ultrasound guided therapeutic right thoracentesis yielding 1.2 liters of pleural fluid. Read by: Loyce Dys PA-C Electronically Signed   By: Malachy Moan M.D.   On: 05/03/2017 16:16   US Thoracentesis Asp Pleural Space W/img Guide  Result Date: 04/26/2017 INDICATION: Shortness of breath. Respiratory failure. Right-sided pleural effusion. Request diagnostic and therapeutic thoracentesis. EXAM: ULTRASOUND GUIDED RIGHT THORACENTESIS MEDICATIONS: None. COMPLICATIONS: None immediate. PROCEDURE: An ultrasound guided thoracentesis was thoroughly discussed with the patient and questions answered. The benefits, risks, alternatives and complications were also discussed. The patient understands and wishes to proceed with the procedure. Written consent was obtained. Ultrasound was performed to localize and mark an adequate pocket of fluid in the right chest. The area was then prepped and draped in the normal sterile fashion. 1% Lidocaine was used for local anesthesia. Under ultrasound guidance a Safe-T-Centesis catheter was introduced. Thoracentesis was performed. The catheter was removed and a dressing applied. FINDINGS: A total of approximately 1 L of clear yellow fluid was removed. Samples were sent to the laboratory as requested by the clinical team. IMPRESSION: Successful ultrasound guided right thoracentesis yielding 1 L of pleural fluid. Read by: Brayton El PA-C Electronically Signed   By: Gilmer Mor D.O.   On: 04/26/2017 15:47    Lab Data:  CBC: Recent Labs  Lab 04/28/17 0612 04/29/17 0541 05/01/17 0434 05/02/17 0348 05/03/17 0712 05/03/17 1750  WBC 5.7 6.7 6.3 8.0 6.3 6.9  NEUTROABS 4.6 5.4  --   --   --   --   HGB 7.9* 9.5* 9.3* 9.6* 10.2* 9.7*  HCT 28.0* 33.8* 34.0* 36.1 37.4 36.8  MCV 84.1 87.1 90.2 90.7 91.4 92.0  PLT 113* 167 180 169 135* 220   Basic Metabolic Panel: Recent Labs  Lab 05/01/17 0434 05/02/17 0348 05/03/17 0712 05/04/17 0726 05/04/17 1042  NA 141 143 145 142 142  K 3.7 3.6 3.6 4.4 3.6  CL 98* 95* 93* 92* 90*  CO2 38* 38* 43* 37* 38*  GLUCOSE 128* 85 87 54* 106*  BUN 16 14 14 20 19   CREATININE  0.58 0.60 0.60 0.65 0.68  CALCIUM 8.6* 8.4* 8.5* 8.7* 8.7*   GFR: Estimated Creatinine Clearance: 40.7 mL/min (by C-G formula based on SCr of 0.68 mg/dL).  Cardiac Enzymes: No results for input(s): CKTOTAL, CKMB, CKMBINDEX, TROPONINI in the last 168 hours. CBG: Recent Labs  Lab 04/28/17 0022 04/28/17 0415 04/28/17 0758 05/04/17 0956 05/04/17 1024  GLUCAP 86 73 74 44* 46*   Urine analysis:    Component Value Date/Time   COLORURINE YELLOW 04/25/2017 2229   APPEARANCEUR CLEAR 04/25/2017 2229   LABSPEC 1.010 04/25/2017 2229   PHURINE 5.0 04/25/2017 2229   GLUCOSEU NEGATIVE 04/25/2017 2229   HGBUR SMALL (A) 04/25/2017 2229   BILIRUBINUR NEGATIVE 04/25/2017 2229   KETONESUR 5 (A) 04/25/2017 2229   PROTEINUR NEGATIVE 04/25/2017 2229   UROBILINOGEN 0.2 04/05/2008 0629   NITRITE NEGATIVE 04/25/2017 2229   LEUKOCYTESUR NEGATIVE 04/25/2017 2229  Marcellus Scott, MD, FACP, [facility]. [facility] Pager [phone]  If 7PM-7AM, please contact night-coverage [email or web address] Password [email or web address] 05/04/2017, 2:40 PM

## 2017-05-04 NOTE — Progress Notes (Signed)
CSW spoke with family for possibly SNF placements.  Family will contact CSW with SNF choices.  Budd Palmerara Kerem Gilmer LCSWA [phone]

## 2017-05-04 NOTE — Progress Notes (Signed)
CSW was contacted by pt's son [name] concerning SNF choices for pt.  Pt's family decided on 1. [facility], 2.[facility], 3.[facility].  Choices have been placed in hub.  Budd Palmerara Candiace West LCSWA [phone]

## 2017-05-05 LAB — BASIC METABOLIC PANEL
ANION GAP: 12 (ref 5–15)
BUN: 17 mg/dL (ref 6–20)
CHLORIDE: 92 mmol/L — AB (ref 101–111)
CO2: 38 mmol/L — ABNORMAL HIGH (ref 22–32)
Calcium: 8.7 mg/dL — ABNORMAL LOW (ref 8.9–10.3)
Creatinine, Ser: 0.56 mg/dL (ref 0.44–1.00)
Glucose, Bld: 105 mg/dL — ABNORMAL HIGH (ref 65–99)
POTASSIUM: 3.6 mmol/L (ref 3.5–5.1)
Sodium: 142 mmol/L (ref 135–145)

## 2017-05-05 NOTE — Progress Notes (Signed)
Per Dr. Waymon AmatoHongalgi remove foley at this time. If Patient does not void in 8 hours bladder scan patient. If bladder scan shows greater than 350ml in bladder replace foley.

## 2017-05-05 NOTE — Progress Notes (Signed)
CSW contacted possible SNF placements for pt.  [facility] is currently full, Will need to follow up with [facility] (speak with Revonda StandardAllison at [phone]) on Monday.  [facility] not able to leave message/no answer. CSW contacted pt's son with updated information on family's choices.   Budd Palmerara [name] LCSWA [phone]

## 2017-05-05 NOTE — Progress Notes (Signed)
[facility]                                                                              Patient Demographics  [name], is a 84 y.o. female, DOB - October 16, 1932, OZH:[id number]  Admit date - 04/22/2017   Admitting Physician Jonah Blue, MD  Outpatient Primary MD for the patient is No primary care provider on file.  Outpatient specialists:   LOS - 13  days   Medical records reviewed and are as summarized below:    Chief Complaint  Patient presents with  . Fall  . Hypertension       Brief summary   Patient is a 84 year old female with history of chronic diastolic CHF, CAD status post MVR, CABG 2008, AAA, hypertension, hyperlipidemia, possible dementia who was admitted on 4/8 with a right intertrochanteric fracture after mechanical fall.  Patient underwent ORIF surgery on 4/11, developed postop lethargy and required intubation for worsening hypoxic respiratory failure, extubated on 4/13. Transferred to [facility] hospitalist service on 04/30/17.  Hospital course complicated by recurrent pleural effusion, status post right thoracentesis 4/19.  Slowly improving.    Assessment & Plan    Principal Problem:   Closed displaced intertrochanteric fracture of right femur (HCC) - Status post mechanical fall, ORIF on 4/11 - PT recommended skilled nursing facility for rehab pending medical stability  Active Problems: Acute respiratory failure with hypoxia with pulmonary edema, bilateral pleural effusion - Postop, developed lethargy, respiratory failure and was intubated.  Patient was extubated on 4/13. - On 4/17, patient had hypoxia, tachypnea, received IV Lasix, chest x-ray with bilateral pleural effusions, BNP elevated - Started on Lasix IV 40 mg daily, negative balance of 5.2 L -Afternoon oh 4/18, patient developed worsening dyspnea. CTA negative for PE but large right sided pleural effusion and moderate left -Ultrasound-guided right thoracentesis was ordered and underwent  seen by IR on 4/19 yielding 1.2 L on the right. -Respiratory status improved.  Not in respiratory distress. -Suspect her bilateral pleural effusions are related to CHF and pulmonary hypertension.  At risk for recurrent pleural effusions.  Discussed with extended family including spouse, son, daughter-in-law who apparently is an Charity fundraiser at bedside and advised them regarding high risk of recurrent decline and failure to thrive in which case may wish to consider palliative care consultation for goals of care.  They verbalized understanding.  Bilateral pleural effusions, acute on chronic diastolic CHF, severe pulmonary hypertension -Cardiology assisted with management. -Underwent ultrasound-guided right thoracentesis on 4/12 with 1 L of pleural fluid removed, fluid studies consistent with transudative pleural effusion.  Had another right-sided ultrasound-guided thoracentesis of 1.2 L on 4/19. -Peripherally she appears euvolemic.  -5.9 L since admission.  Agree with cardiology and transitioning to oral Lasix 40 mg daily.  Cardiology recommends no further workup for pulmonary hypertension or ischemia workup.  Cardiology signed off 4/20. -Continue oral Lasix 40 mg daily.  Elevated troponin - Likely due to acute respiratory failure and acute on chronic diastolic CHF - Cardiology signed off, no ischemia workup planned.  Acute encephalopathy superimposed on underlying dementia -Possibly worsened due to medications, hypercarbia, Sundowning -Improving, continue supportive care, limit sedating  meds.  As per discussion with son, mental status close to recent baseline.  Pulmonary hypertension -Continue current therapy, no further workup planned per cardiology  Essential hypertension -BP currently stable, continue beta-blocker, Lasix, enalapril.    Normocytic anemia -Postop hemoglobin down to 7.5, likely due to blood loss anemia, status post transfusion,  -H&H stable  Moderate malnutrition -Nutrition  consult  Pressure injury of the skin -Per nursing flow sheet, right shoulder abrasion, foam dressing  Urinary retention Bladder ultrasound> 500cc, in & out cath on 4/17.  Discontinued Foley catheter 4/21 and check for voiding.  If has recurrent urinary retention, placed back Foley catheter at discharge to SNF and outpatient follow-up with urology.  Thrombocytopenia Noted on 4/19: 134 was likely lab error.  Normal platelet count on 4/20.  Hypoglycemia: Noted glucose of 54 on a.m. BMP.  RN followed up with RN juice, breakfast and multiple repeat CBGs continue to show low at 44 > 46.  Concerned that she may have poor peripheral perfusion hence error.  Repeated BMP stat, glucose 106.  Would repeat BMP if she is symptomatic of hypoglycemia.  Stable.  Adult failure to thrive. Multifactorial related to advanced age, frail physical status and multiple severe significant comorbidities.  Discussed in detail with family and advised them that if she is not making sustained improvement and has recurrent acute decline, may wish to consider palliative care consultation for goals of care.  Code Status: Full CODE STATUS DVT Prophylaxis: Lovenox Family Communication: Discussed in detail with patient's spouse, son and daughter-in-law at bedside.  Updated care and answered questions. Disposition Plan: DC to SNF pending bed availability.  May consider palliative care consultation at SNF.  Time Spent in minutes 25 minutes  Procedures:  ORIF 4/11 Intubation Ultrasound-guided right thoracentesis by IR 4/12 and 4/19  Consultants:   Orthopedics PCCM Cardiology IR  Antimicrobials:   None   Medications  Scheduled Meds: . aspirin  81 mg Oral Daily  . atorvastatin  40 mg Oral q1800  . enalapril  20 mg Oral Daily  . enoxaparin (LOVENOX) injection  30 mg Subcutaneous Q24H  . feeding supplement (ENSURE ENLIVE)  237 mL Oral BID BM  . furosemide  40 mg Oral Daily  . mouth rinse  15 mL Mouth Rinse BID   . metoprolol tartrate  50 mg Per Tube BID  . potassium chloride  20 mEq Oral Daily   Continuous Infusions:  PRN Meds:.acetaminophen, bisacodyl, docusate, iopamidol, polyethylene glycol     Subjective:   Appeared more confused this morning with family at bedside.  Noted minimal DOE after nursing had tidied her but this resolved after resting for a few minutes.  No dyspnea reported.  Objective:   Vitals:   05/04/17 2122 05/05/17 0500 05/05/17 1432 05/05/17 1433  BP: (!) 128/51 (!) 148/59  (!) 108/51  Pulse: 67 72  61  Resp:    19  Temp:    (!) 97.5 F (36.4 C)  TempSrc:    Oral  SpO2: 100% 100% 100%   Weight:  46.3 kg (102 lb)    Height:        Intake/Output Summary (Last 24 hours) at 05/05/2017 1457 Last data filed at 05/04/2017 1852 Gross per 24 hour  Intake -  Output 350 ml  Net -350 ml     Wt Readings from Last 3 Encounters:  05/05/17 46.3 kg (102 lb)  01/26/15 54.6 kg (120 lb 6.4 oz)  09/30/13 61.7 kg (136 lb)     Exam  General: Elderly female, small built, frail and chronically ill looking lying comfortably propped up in bed. Cardiovascular: S1 and S2 heard, RRR.  No JVD, murmurs or pedal edema. Respiratory: Reduced breath sounds in the bases with occasional basal crackles.  Rest of lung fields clear to auscultation without wheezing or rhonchi.  No increased work of breathing. Gastrointestinal: Nondistended, soft and nontender.  Normal bowel sounds heard.  Stable. Ext: Moves all extremities normally. Neuro alert and oriented only to self.  Slightly sleepy this morning.  Follow some instructions.  No focal deficits. Psych: As above.  Data Reviewed:  I have personally reviewed following labs and imaging studies  Micro Results Recent Results (from the past 240 hour(s))  Culture, body fluid-bottle     Status: None   Collection Time: 04/26/17  3:30 PM  Result Value Ref Range Status   Specimen Description PLEURAL RIGHT  Final   Special Requests NONE  Final    Culture   Final    NO GROWTH 5 DAYS Performed at [facility], [address] [place], [place] [address]    Report Status 05/01/2017 FINAL  Final  Gram stain     Status: None   Collection Time: 04/26/17  3:30 PM  Result Value Ref Range Status   Specimen Description PLEURAL RIGHT  Final   Special Requests NONE  Final   Gram Stain   Final    FEW WBC PRESENT,BOTH PMN AND MONONUCLEAR NO ORGANISMS SEEN Performed at [facility], [address] [place], [place] [address]    Report Status 04/26/2017 FINAL  Final    Radiology Reports Dg Chest 1 View  Result Date: 05/03/2017 CLINICAL DATA:  Status post right thoracentesis. EXAM: CHEST  1 VIEW COMPARISON:  05/02/2017 CT of the chest FINDINGS: Cardiac shadow remains enlarged. Postsurgical changes are again noted. Bilateral pleural effusions are noted left greater than right. The right effusion is significantly reduced when compared with the prior CT examination. No definitive pneumothorax is seen. IMPRESSION: Status post right thoracentesis without pneumothorax. Mild residual effusion remains. Stable left pleural effusion when compared with the prior study. Electronically Signed   By: Alcide Clever M.D.   On: 05/03/2017 16:15   Dg Chest 1 View  Result Date: 04/22/2017 CLINICAL DATA:  Preop evaluation for right hip surgery EXAM: CHEST  1 VIEW COMPARISON:  04/05/2008 FINDINGS: Cardiac shadow remains enlarged. Postsurgical changes are again seen. Right-sided pleural effusion and atelectatic/infiltrative changes are noted. No pneumothorax is seen. No acute bony abnormality is noted. IMPRESSION: Right basilar atelectatic/infiltrative changes with associated effusion. Electronically Signed   By: Alcide Clever M.D.   On: 04/22/2017 11:56   Ct Angio Chest Pe W Or Wo Contrast  Result Date: 05/02/2017 CLINICAL DATA:  84 y/o F; Shortness of breath PE suspected, high pretest prob. EXAM: CT ANGIOGRAPHY CHEST WITH CONTRAST TECHNIQUE: Multidetector  CT imaging of the chest was performed using the standard protocol during bolus administration of intravenous contrast. Multiplanar CT image reconstructions and MIPs were obtained to evaluate the vascular anatomy. CONTRAST:  70mL ISOVUE-370 IOPAMIDOL (ISOVUE-370) INJECTION 76% COMPARISON:  05/01/2017 chest radiograph FINDINGS: Cardiovascular: Severe calcific atherosclerosis of the aorta and coronary arteries. Status post CABG with LIMA and saphenous grafts. Normal caliber thoracic aorta and main pulmonary artery. Mild cardiomegaly. Mitral valve replacement. No pericardial effusion. Satisfactory opacification of the pulmonary arteries. No pulmonary embolus identified. Mediastinum/Nodes: No enlarged mediastinal, hilar, or axillary lymph nodes. Thyroid gland, trachea, and esophagus demonstrate no significant findings. Lungs/Pleura:  Large right and moderate left pleural effusion with fluid tracking along the fissures. Near complete collapse of the right lower lobe and dependent atelectasis of the left lower lobe. Centrilobular emphysema greatest in the lung apices. No pneumothorax. Upper Abdomen: Healed median sternotomy chronic postsurgical changes. No acute osseous abnormality is identified. Musculoskeletal: No chest wall abnormality. No acute or significant osseous findings. Review of the MIP images confirms the above findings. IMPRESSION: 1. No pulmonary embolus identified. 2. Large right and moderate left pleural effusions. 3. Near complete collapse of right lower lobe and dependent atelectasis in left lower lobe. 4. Severe calcific atherosclerosis of the aorta and coronary arteries. 5. Cardiomegaly. 6. Emphysema. Electronically Signed   By: Mitzi Hansen M.D.   On: 05/02/2017 17:11   Dg Chest Port 1 View  Result Date: 05/01/2017 CLINICAL DATA:  Hypertension.  Dementia.  Congestive heart failure. EXAM: PORTABLE CHEST 1 VIEW COMPARISON:  04/30/2017 FINDINGS: Similar appearance. Large bilateral  effusions with dependent atelectasis. Venous hypertension with mild pulmonary edema. IMPRESSION: No change since yesterday. Large effusions with dependent atelectasis. Mild edema. Electronically Signed   By: Paulina Fusi M.D.   On: 05/01/2017 11:49   Dg Chest Port 1 View  Result Date: 04/30/2017 CLINICAL DATA:  Pleural effusion, shortness of breath EXAM: PORTABLE CHEST 1 VIEW COMPARISON:  04/26/2017 FINDINGS: Prior CABG. Cardiomegaly. Layering bilateral effusions have increased since prior study. Increasing perihilar and lower lobe opacities and interstitial prominence, likely edema and superimposed areas of atelectasis. IMPRESSION: Increasing layering bilateral effusions, bilateral perihilar and lower lobe opacities and interstitial prominence. Findings likely reflect edema/CHF and superimposed areas of bibasilar atelectasis. Electronically Signed   By: Charlett Nose M.D.   On: 04/30/2017 07:55   Dg Chest Port 1 View  Result Date: 04/26/2017 CLINICAL DATA:  Post thoracentesis EXAM: PORTABLE CHEST 1 VIEW COMPARISON:  04/26/2017, 04/25/2017 FINDINGS: Support devices obscure the upper chest. Endotracheal tube tip is about 2.7 cm superior to the carina. Esophageal tube tip is below the diaphragm but non included. Post sternotomy changes and valve prosthesis. Decreased right greater than left pleural effusions. No definitive right pneumothorax. Improved aeration of the right lower lung. Residual consolidation at the right middle lobe and right base. Mediastinal silhouette is exaggerated by patient rotation. Dense aortic atherosclerosis. IMPRESSION: 1. Decreased small bilateral right greater than left pleural effusions. No definitive right pneumothorax seen 2. Persistent airspace disease at the right middle lobe and right base, atelectasis versus pneumonia. Electronically Signed   By: Jasmine Pang M.D.   On: 04/26/2017 15:45   Portable Chest Xray  Result Date: 04/26/2017 CLINICAL DATA:  Endotracheal tube  EXAM: PORTABLE CHEST 1 VIEW COMPARISON:  04/25/2017 FINDINGS: Cardiac shadow is stable. Postsurgical changes are again seen. Endotracheal tube and nasogastric catheter are noted in satisfactory position. Bilateral pleural effusions are again seen right greater than left. Underlying atelectasis is present. IMPRESSION: Stable bilateral pleural effusions and atelectasis. Electronically Signed   By: Alcide Clever M.D.   On: 04/26/2017 07:55   Dg Chest Port 1 View  Result Date: 04/25/2017 CLINICAL DATA:  Endotracheal tube present.  OG tube placement. EXAM: PORTABLE CHEST 1 VIEW COMPARISON:  Radiograph earlier this day at 0617 hour FINDINGS: Endotracheal tube 2.2 cm from the carina. Enteric tube in place with tip and side-port below the diaphragm in the stomach. Post median sternotomy. Unchanged cardiomegaly and mediastinal contours. Aortic atherosclerosis. Unchanged right pleural effusion and basilar airspace disease. Improved left pleural effusion. Improving bilateral airspace disease. No pneumothorax. The bones  are under mineralized. IMPRESSION: 1. Endotracheal tube tip 2.2 cm from the carina. Tip and side port of the enteric tube below the diaphragm. 2. Bilateral pleural effusions, moderate on the right and small on the left, with improvement from exam earlier this day. 3. Improving aeration with improvement in bilateral airspace disease. Electronically Signed   By: Rubye OaksMelanie  Ehinger M.D.   On: 04/25/2017 21:47   Dg Chest Port 1 View  Result Date: 04/25/2017 CLINICAL DATA:  Shortness of Breath EXAM: PORTABLE CHEST 1 VIEW COMPARISON:  04/23/2017 FINDINGS: Changes of median sternotomy and valve replacement. Moderate to large right effusion is similar or slightly improved since prior study. Diffuse right lung airspace disease. Small to moderate left effusion, new since prior study with left lower lobe atelectasis or infiltrate. IMPRESSION: Moderate right effusion, slightly improved since prior study. New small to  moderate left effusion. Bilateral airspace disease which could reflect atelectasis or pneumonia, right greater than left. Electronically Signed   By: Charlett NoseKevin  Dover M.D.   On: 04/25/2017 08:59   Dg Chest Port 1 View  Result Date: 04/23/2017 CLINICAL DATA:  84 year old female with hypoxia. Subsequent encounter. EXAM: PORTABLE CHEST 1 VIEW COMPARISON:  04/22/2017 chest x-ray. FINDINGS: Post CABG and valve replacement.  Cardiomegaly. Progressive enlargement of right-sided pleural effusion now moderate to large in size with associated right lung atelectasis. Pulmonary vascular congestion. Medial left base subsegmental atelectasis. Calcified tortuous aorta.  Prominent vascular calcifications. IMPRESSION: Progressive enlargement of right-sided pleural effusion now moderate to large in size with associated right lung atelectasis. Mild atelectasis medial aspect left lung base. Pulmonary vascular congestion. Cardiomegaly post CABG and valve replacement. Aortic Atherosclerosis (ICD10-I70.0). Electronically Signed   By: Lacy DuverneySteven  Olson M.D.   On: 04/23/2017 08:45   Dg C-arm 1-60 Min  Result Date: 04/25/2017 CLINICAL DATA:  Right intertrochanteric femur fracture ORIF. EXAM: DG C-ARM 61-120 MIN; RIGHT FEMUR 2 VIEWS COMPARISON:  Right hip x-rays dated April 22, 2017. FINDINGS: Intraoperative x-rays demonstrate interval placement of a right femoral cephalomedullary rod with distal interlocking screw. Near anatomic alignment of the dominant intertrochanteric fracture fragments. The lesser trochanter remains mildly displaced medially. Right hip osteoarthritis. Vascular calcifications. IMPRESSION: 1. Improved alignment of the right intertrochanteric femur fracture status post ORIF. FLUOROSCOPY TIME:  1 minutes, 37 seconds. C-arm fluoroscopic images were obtained intraoperatively and submitted for post operative interpretation. Electronically Signed   By: Obie DredgeWilliam T Derry M.D.   On: 04/25/2017 15:46   Dg Hip Unilat W Or Wo Pelvis  2-3 Views Right  Result Date: 04/22/2017 CLINICAL DATA:  Recent fall with right leg deformity EXAM: DG HIP (WITH OR WITHOUT PELVIS) 2-3V RIGHT COMPARISON:  None. FINDINGS: Pelvic ring is intact. Comminuted intratrochanteric fracture of the proximal right femur is noted with impaction and angulation at the fracture site. No soft tissue changes are seen. Diffuse vascular calcifications are noted. IMPRESSION: Comminuted intratrochanteric fracture of the proximal right femur. Electronically Signed   By: Alcide CleverMark  Lukens M.D.   On: 04/22/2017 12:01   Dg Femur, Min 2 Views Right  Result Date: 04/25/2017 CLINICAL DATA:  Right intertrochanteric femur fracture ORIF. EXAM: DG C-ARM 61-120 MIN; RIGHT FEMUR 2 VIEWS COMPARISON:  Right hip x-rays dated April 22, 2017. FINDINGS: Intraoperative x-rays demonstrate interval placement of a right femoral cephalomedullary rod with distal interlocking screw. Near anatomic alignment of the dominant intertrochanteric fracture fragments. The lesser trochanter remains mildly displaced medially. Right hip osteoarthritis. Vascular calcifications. IMPRESSION: 1. Improved alignment of the right intertrochanteric femur fracture status post ORIF.  FLUOROSCOPY TIME:  1 minutes, 37 seconds. C-arm fluoroscopic images were obtained intraoperatively and submitted for post operative interpretation. Electronically Signed   By: Obie Dredge M.D.   On: 04/25/2017 15:46   Ir Thoracentesis Asp Pleural Space W/img Guide  Result Date: 05/03/2017 INDICATION: Patient with shortness of breath, respiratory failure. Now with recurrent right pleural effusion. Request is made for therapeutic thoracentesis. EXAM: ULTRASOUND GUIDED THERAPEUTIC RIGHT THORACENTESIS MEDICATIONS: 10 mL 1% lidocaine COMPLICATIONS: None immediate. PROCEDURE: An ultrasound guided thoracentesis was thoroughly discussed with the patient and questions answered. The benefits, risks, alternatives and complications were also discussed. The  patient understands and wishes to proceed with the procedure. Written consent was obtained. Ultrasound was performed to localize and mark an adequate pocket of fluid in the right chest. The area was then prepped and draped in the normal sterile fashion. 1% Lidocaine was used for local anesthesia. Under ultrasound guidance a 19 gauge, 7-cm, Yueh catheter was introduced. Thoracentesis was performed. The catheter was removed and a dressing applied. FINDINGS: A total of approximately 1.2 liters of yellow fluid was removed. Samples were sent to the laboratory as requested by the clinical team. IMPRESSION: Successful ultrasound guided therapeutic right thoracentesis yielding 1.2 liters of pleural fluid. Read by: Loyce Dys PA-C Electronically Signed   By: Malachy Moan M.D.   On: 05/03/2017 16:16   US Thoracentesis Asp Pleural Space W/img Guide  Result Date: 04/26/2017 INDICATION: Shortness of breath. Respiratory failure. Right-sided pleural effusion. Request diagnostic and therapeutic thoracentesis. EXAM: ULTRASOUND GUIDED RIGHT THORACENTESIS MEDICATIONS: None. COMPLICATIONS: None immediate. PROCEDURE: An ultrasound guided thoracentesis was thoroughly discussed with the patient and questions answered. The benefits, risks, alternatives and complications were also discussed. The patient understands and wishes to proceed with the procedure. Written consent was obtained. Ultrasound was performed to localize and mark an adequate pocket of fluid in the right chest. The area was then prepped and draped in the normal sterile fashion. 1% Lidocaine was used for local anesthesia. Under ultrasound guidance a Safe-T-Centesis catheter was introduced. Thoracentesis was performed. The catheter was removed and a dressing applied. FINDINGS: A total of approximately 1 L of clear yellow fluid was removed. Samples were sent to the laboratory as requested by the clinical team. IMPRESSION: Successful ultrasound guided right  thoracentesis yielding 1 L of pleural fluid. Read by: Brayton El PA-C Electronically Signed   By: Gilmer Mor D.O.   On: 04/26/2017 15:47    Lab Data:  CBC: Recent Labs  Lab 04/29/17 0541 05/01/17 0434 05/02/17 0348 05/03/17 0712 05/03/17 1750  WBC 6.7 6.3 8.0 6.3 6.9  NEUTROABS 5.4  --   --   --   --   HGB 9.5* 9.3* 9.6* 10.2* 9.7*  HCT 33.8* 34.0* 36.1 37.4 36.8  MCV 87.1 90.2 90.7 91.4 92.0  PLT 167 180 169 135* 220   Basic Metabolic Panel: Recent Labs  Lab 05/02/17 0348 05/03/17 0712 05/04/17 0726 05/04/17 1042 05/05/17 0551  NA 143 145 142 142 142  K 3.6 3.6 4.4 3.6 3.6  CL 95* 93* 92* 90* 92*  CO2 38* 43* 37* 38* 38*  GLUCOSE 85 87 54* 106* 105*  BUN 14 14 20 19 17   CREATININE 0.60 0.60 0.65 0.68 0.56  CALCIUM 8.4* 8.5* 8.7* 8.7* 8.7*   GFR: Estimated Creatinine Clearance: 39.6 mL/min (by C-G formula based on SCr of 0.56 mg/dL).  Cardiac Enzymes: No results for input(s): CKTOTAL, CKMB, CKMBINDEX, TROPONINI in the last 168 hours. CBG: Recent Labs  Lab  05/04/17 0956 05/04/17 1024  GLUCAP 44* 46*   Urine analysis:    Component Value Date/Time   COLORURINE YELLOW 04/25/2017 2229   APPEARANCEUR CLEAR 04/25/2017 2229   LABSPEC 1.010 04/25/2017 2229   PHURINE 5.0 04/25/2017 2229   GLUCOSEU NEGATIVE 04/25/2017 2229   HGBUR SMALL (A) 04/25/2017 2229   BILIRUBINUR NEGATIVE 04/25/2017 2229   KETONESUR 5 (A) 04/25/2017 2229   PROTEINUR NEGATIVE 04/25/2017 2229   UROBILINOGEN 0.2 04/05/2008 0629   NITRITE NEGATIVE 04/25/2017 2229   LEUKOCYTESUR NEGATIVE 04/25/2017 2229     Marcellus Scott, MD, FACP, [facility]. [facility] Pager [phone]  If 7PM-7AM, please contact night-coverage [email or web address] Password [facility] 05/05/2017, 2:57 PM

## 2017-05-06 ENCOUNTER — Other Ambulatory Visit: Payer: Self-pay | Admitting: Cardiology

## 2017-05-06 DIAGNOSIS — S72141S Displaced intertrochanteric fracture of right femur, sequela: Secondary | ICD-10-CM

## 2017-05-06 MED ORDER — ENALAPRIL MALEATE 20 MG PO TABS: 20.0000 mg | ORAL_TABLET | Freq: Every day | ORAL | Status: AC

## 2017-05-06 NOTE — Progress Notes (Signed)
PT Cancellation Note  Patient Details Name: [name] MRN: [id number] DOB: 12/30/1932   Cancelled Treatment:    Reason Eval/Treat Not Completed: Other (comment) Per RN and chart review, patient is definitely to discharge to SNF this afternoon. Hold PT due to patient being involved in preparation for discharge.    Nedra HaiKristen Unger PT, DPT, CBIS  Supplemental Physical Therapist [facility]   Pager [phone]

## 2017-05-06 NOTE — Telephone Encounter (Signed)
Rx request sent to pharmacy.  

## 2017-05-06 NOTE — Progress Notes (Signed)
Patient will DC to: [facility] Anticipated DC date: 05/06/17 Family notified: Son, Spouse, daughter-in-law Transport by: Sharin MonsPTAR   Per MD patient ready for DC to [facility]. RN, patient, patient's family, and facility notified of DC. Discharge Summary sent to facility. RN given number for report [phone] Room [phone]). DC packet on chart. Ambulance transport requested for patient.   CSW signing off.  Cristobal GoldmannNadia Verlaine Embry, LCSW Clinical Social Worker [phone]

## 2017-05-06 NOTE — Progress Notes (Signed)
Nsg Discharge Note  Admit Date:  04/22/2017 Discharge date: 05/06/2017   [name] to be D/C'd Skilled nursing facility per MD order.  AVS completed.  Copy for chart, and copy for patient signed, and dated. Patient/caregiver able to verbalize understanding.  Discharge Medication: Allergies as of 05/06/2017   No Known Allergies     Medication List    STOP taking these medications   amLODipine 5 MG tablet Commonly known as:  NORVASC   metoprolol succinate 100 MG 24 hr tablet Commonly known as:  TOPROL-XL     TAKE these medications   aspirin 81 MG tablet Take 81 mg by mouth daily.   atorvastatin 40 MG tablet Commonly known as:  LIPITOR Take 1 tablet (40 mg total) by mouth daily at 6 PM. NEED OV.   enalapril 20 MG tablet Commonly known as:  VASOTEC Take 1 tablet (20 mg total) by mouth daily. Start taking on:  05/07/2017   enoxaparin 30 MG/0.3ML injection Commonly known as:  LOVENOX Inject 0.3 mLs (30 mg total) into the skin daily. For 30 day post op for DVT prophylaxis.   feeding supplement (ENSURE ENLIVE) Liqd Take 237 mLs by mouth 2 (two) times daily between meals.   furosemide 40 MG tablet Commonly known as:  LASIX TAKE 1 TABLET BY MOUTH EVERY DAY What changed:    how much to take  how to take this  when to take this   HYDROcodone-acetaminophen 5-325 MG tablet Commonly known as:  NORCO Take 1 tablet by mouth every 4 (four) hours as needed for moderate pain.   KLOR-CON M20 20 MEQ tablet Generic drug:  potassium chloride SA TAKE 1 TABLET EVERY DAY -MUST MAKE APPOINTMENT FOR FURTHER REFILLS   metoprolol tartrate 50 MG tablet Commonly known as:  LOPRESSOR Take 1 tablet (50 mg total) by mouth 2 (two) times daily.   polyethylene glycol packet Commonly known as:  MIRALAX / GLYCOLAX Take 17 g by mouth daily as needed for mild constipation.       Discharge Assessment: Vitals:   05/06/17 0620 05/06/17 1440  BP: 138/60 (!) 109/52  Pulse: 69 (!) 57   Resp: 16 17  Temp: 97.9 F (36.6 C) 97.7 F (36.5 C)  SpO2: 98% 99%   IV catheter discontinued intact. Site without signs and symptoms of complications - no redness or edema noted at insertion site, patient denies c/o pain - only slight tenderness at site.  Dressing with slight pressure applied.  D/c Instructions-Education: Discharge instructions given to patient/family with verbalized understanding. D/c education completed with patient/family including follow up instructions, medication list, d/c activities limitations if indicated, with other d/c instructions as indicated by MD - patient able to verbalize understanding, all questions fully answered. Patient instructed to return to ED, call 911, or call MD for any changes in condition.  Patient escorted via WC, and D/C home via private auto.  [place] N Amer Alcindor, RN 05/06/2017 4:03 PM  Nsg Discharge Note  Admit Date:  04/22/2017 Discharge date: 05/06/2017   [name] to be D/C'd Skilled nursing facility per MD order.  AVS completed.  Copy for chart, and copy for patient signed, and dated. Patient/caregiver able to verbalize understanding.  Discharge Medication: Allergies as of 05/06/2017   No Known Allergies     Medication List    STOP taking these medications   amLODipine 5 MG tablet Commonly known as:  NORVASC   metoprolol succinate 100 MG 24 hr tablet Commonly known as:  TOPROL-XL  TAKE these medications   aspirin 81 MG tablet Take 81 mg by mouth daily.   atorvastatin 40 MG tablet Commonly known as:  LIPITOR Take 1 tablet (40 mg total) by mouth daily at 6 PM. NEED OV.   enalapril 20 MG tablet Commonly known as:  VASOTEC Take 1 tablet (20 mg total) by mouth daily. Start taking on:  05/07/2017   enoxaparin 30 MG/0.3ML injection Commonly known as:  LOVENOX Inject 0.3 mLs (30 mg total) into the skin daily. For 30 day post op for DVT prophylaxis.   feeding supplement (ENSURE ENLIVE) Liqd Take 237 mLs by mouth 2  (two) times daily between meals.   furosemide 40 MG tablet Commonly known as:  LASIX TAKE 1 TABLET BY MOUTH EVERY DAY What changed:    how much to take  how to take this  when to take this   HYDROcodone-acetaminophen 5-325 MG tablet Commonly known as:  NORCO Take 1 tablet by mouth every 4 (four) hours as needed for moderate pain.   KLOR-CON M20 20 MEQ tablet Generic drug:  potassium chloride SA TAKE 1 TABLET EVERY DAY -MUST MAKE APPOINTMENT FOR FURTHER REFILLS   metoprolol tartrate 50 MG tablet Commonly known as:  LOPRESSOR Take 1 tablet (50 mg total) by mouth 2 (two) times daily.   polyethylene glycol packet Commonly known as:  MIRALAX / GLYCOLAX Take 17 g by mouth daily as needed for mild constipation.       Discharge Assessment: Vitals:   05/06/17 0620 05/06/17 1440  BP: 138/60 (!) 109/52  Pulse: 69 (!) 57  Resp: 16 17  Temp: 97.9 F (36.6 C) 97.7 F (36.5 C)  SpO2: 98% 99%   Skin clean, dry and intact without evidence of skin break down, no evidence of skin tears noted. IV catheter discontinued intact. Site without signs and symptoms of complications - no redness or edema noted at insertion site, patient denies c/o pain - only slight tenderness at site.  Dressing with slight pressure applied.  D/c Instructions-Education: Discharge instructions given to patient/family with verbalized understanding. D/c education completed with patient/family including follow up instructions, medication list, d/c activities limitations if indicated, with other d/c instructions as indicated by MD - patient able to verbalize understanding, all questions fully answered. Patient instructed to return to ED, call 911, or call MD for any changes in condition.  Patient discharged via [facility] to [facility]  [place] N Starlette Thurow, RN 05/06/2017 4:03 PM

## 2017-05-06 NOTE — Discharge Instructions (Signed)
WBAT RLE Keep Dressings Clean and Dry until follow up.  Additional discharge instructions:  Please get your medications reviewed and adjusted by your Primary MD.  Please request your Primary MD to go over all Hospital Tests and Procedure/Radiological results at the follow up, please get all Hospital records sent to your Prim MD by signing hospital release before you go home.  If you had Pneumonia of Lung problems at the Hospital: Please get a 2 view Chest X ray done in 6-8 weeks after hospital discharge or sooner if instructed by your Primary MD.  If you have Congestive Heart Failure: Please call your Cardiologist or Primary MD anytime you have any of the following symptoms:  1) 3 pound weight gain in 24 hours or 5 pounds in 1 week  2) shortness of breath, with or without a dry hacking cough  3) swelling in the hands, feet or stomach  4) if you have to sleep on extra pillows at night in order to breathe  Follow cardiac low salt diet and 1.5 lit/day fluid restriction.  If you have diabetes Accuchecks 4 times/day, Once in AM empty stomach and then before each meal. Log in all results and show them to your primary doctor at your next visit. If any glucose reading is under 80 or above 300 call your primary MD immediately.  If you have Seizure/Convulsions/Epilepsy: Please do not drive, operate heavy machinery, participate in activities at heights or participate in high speed sports until you have seen by Primary MD or a Neurologist and advised to do so again.  If you had Gastrointestinal Bleeding: Please ask your Primary MD to check a complete blood count within one week of discharge or at your next visit. Your endoscopic/colonoscopic biopsies that are pending at the time of discharge, will also need to followed by your Primary MD.  Get Medicines reviewed and adjusted. Please take all your medications with you for your next visit with your Primary MD  Please request your Primary MD to  go over all hospital tests and procedure/radiological results at the follow up, please ask your Primary MD to get all Hospital records sent to his/her office.  If you experience worsening of your admission symptoms, develop shortness of breath, life threatening emergency, suicidal or homicidal thoughts you must seek medical attention immediately by calling 911 or calling your MD immediately  if symptoms less severe.  You must read complete instructions/literature along with all the possible adverse reactions/side effects for all the Medicines you take and that have been prescribed to you. Take any new Medicines after you have completely understood and accpet all the possible adverse reactions/side effects.   Do not drive or operate heavy machinery when taking Pain medications.   Do not take more than prescribed Pain, Sleep and Anxiety Medications  Special Instructions: If you have smoked or chewed Tobacco  in the last 2 yrs please stop smoking, stop any regular Alcohol  and or any Recreational drug use.  Wear Seat belts while driving.  Please note You were cared for by a hospitalist during your hospital stay. If you have any questions about your discharge medications or the care you received while you were in the hospital after you are discharged, you can call the unit and asked to speak with the hospitalist on call if the hospitalist that took care of you is not available. Once you are discharged, your primary care physician will handle any further medical issues. Please note that NO REFILLS for any  discharge medications will be authorized once you are discharged, as it is imperative that you return to your primary care physician (or establish a relationship with a primary care physician if you do not have one) for your aftercare needs so that they can reassess your need for medications and monitor your lab values.  You can reach the hospitalist office at phone [phone] or fax [phone]     If you do not have a primary care physician, you can call [phone] for a physician referral.

## 2017-05-06 NOTE — Discharge Summary (Addendum)
Physician Discharge Summary  [name] ZOX:[id number] DOB: 1932-08-18  PCP: No primary care provider on file.  Admit date: 04/22/2017 Discharge date: 05/06/2017  Recommendations for Outpatient Follow-up:  1. MD at SNF in 3 days with repeat labs (CBC & BMP). 2. Recommend palliative care consultation and follow-up at SNF. 3. Dr. Margarita Rana, Orthopedics in 1 week.  SNF to coordinate. 4. Dr. Olga Millers, Cardiology in 2 weeks.  SNF to coordinate. 5. Recommend repeating chest x-ray in 1-2 weeks or as needed.  Home Health: N/A Equipment/Devices: N/A    Discharge Condition: Improved and stable. CODE STATUS: Full. Diet recommendation: Heart healthy diet.  Discharge Diagnoses:  Principal Problem:   Closed displaced intertrochanteric fracture of right femur (HCC) Active Problems:   H/O mitral valve repair   Hypertension, essential, benign   Acute on chronic diastolic (congestive) heart failure (HCC)   Dyslipidemia, goal LDL below 70   PVD (peripheral vascular disease) (HCC)   Dementia   AAA (abdominal aortic aneurysm) (HCC)   Acute respiratory failure with hypoxia (HCC)   CKD (chronic kidney disease), stage III (HCC)   Carotid artery stenosis   Pulmonary hypertension (HCC)   Former tobacco use   Hx of CABG   Pleural effusion, right   Preoperative respiratory examination   Evaluation by psychiatric service required   Hypoxia   Malnutrition of moderate degree   Pressure injury of skin   Endotracheal tube present   Hypoxemia   Pleural effusion   Acute encephalopathy   Brief Summary: Patient is a 84 year old female with history of chronic diastolic CHF, CAD status post MVR, CABG 2008, AAA, hypertension, hyperlipidemia, possible dementia who was admitted on 4/8 with a right intertrochanteric fracture after mechanical fall.  Patient underwent ORIF surgery on 4/11, developed postop lethargy and required intubation for worsening hypoxic respiratory failure, extubated on  4/13. Transferred to [facility] hospitalist service on 04/30/17.  Hospital course complicated by recurrent pleural effusion, status post right thoracentesis 4/19.  Slowly improving   Assessment and plan:  Principal Problem:   Closed displaced intertrochanteric fracture of right femur (HCC) - Status post mechanical fall, ORIF on 4/11 - PT recommended skilled nursing facility for rehab pending medical stability -As per orthopedics, weightbearing as tolerated on operated lower extremity, Lovenox for 30 days for postop DVT prophylaxis, dry dressing changes as needed and outpatient follow-up with them.  Active Problems: Acute respiratory failure with hypoxia with pulmonary edema, bilateral pleural effusion - Postop, developed lethargy, respiratory failure and was intubated.  Patient was extubated on 4/13. - On 4/17, patient had hypoxia, tachypnea, received IV Lasix, chest x-ray with bilateral pleural effusions, BNP elevated - Started on Lasix IV 40 mg daily, negative balance of 7.2 L since admission. - Afternoon oh 4/18, patient developed worsening dyspnea. CTA negative for PE but large right sided pleural effusion and moderate left - Ultrasound-guided right thoracentesis was ordered and underwent seen by IR on 4/19 yielding 1.2 L on the right. - Respiratory status improved.  Not in respiratory distress. - Suspect her bilateral pleural effusions are related to CHF and pulmonary hypertension.  At risk for recurrent pleural effusions.  Discussed with extended family including spouse, son, daughter-in-law who apparently is an Charity fundraiser at bedside and advised them regarding high risk of recurrent decline and failure to thrive in which case may wish to consider palliative care consultation for goals of care.  They verbalized understanding. -Improved and stable for the last 2 days.  Saturating in the high 90s on  3 L/min oxygen via nasal cannula.  Wean down oxygen as tolerated.  Bilateral pleural effusions, acute on  chronic diastolic CHF, severe pulmonary hypertension -Cardiology assisted with management. -Underwent ultrasound-guided right thoracentesis on 4/12 with 1 L of pleural fluid removed, fluid studies consistent with transudative pleural effusion.  Had another right-sided ultrasound-guided thoracentesis of 1.2 L on 4/19. -Peripherally she appears euvolemic.  She has been transitioned to oral Lasix at home dose.  Cardiology recommends no further workup for pulmonary hypertension or ischemia workup.  Cardiology signed off 4/20. -Continue oral Lasix 40 mg daily.  Improved and stable. -She has bilateral atelectasis likely compressive related to pleural effusions.  Incentive spirometry.  Aggressive pulmonary toilet.  Elevated troponin - Likely due to acute respiratory failure and acute on chronic diastolic CHF - Cardiology signed off, no ischemia workup planned.  Acute encephalopathy superimposed on underlying dementia -Possibly worsened due to medications, hypercarbia, Sundowning -Improving, continue supportive care, limit sedating meds.  As per discussion with son, mental status close to recent baseline.  Pulmonary hypertension -Continue current therapy, no further workup planned per cardiology  Essential hypertension -BP currently stable, continue beta-blocker, Lasix, enalapril.    Normocytic anemia -Postop hemoglobin down to 7.5, likely due to blood loss anemia, status post transfusion,  -H&H stable  Moderate malnutrition -Nutrition consult  Pressure injury of the skin -Per nursing flow sheet, right shoulder abrasion, foam dressing  Urinary retention Bladder ultrasound> 500cc, in & out cath on 4/17.  Discontinued Foley catheter 4/21 and as per RN report has been voiding without difficulty.  Thrombocytopenia Noted on 4/19: 134 was likely lab error.  Normal platelet count on 4/20.  Hypoglycemia: Noted glucose of 54 on a.m. BMP.  RN followed up with RN juice, breakfast and  multiple repeat CBGs continue to show low at 44 > 46.  Concerned that she may have poor peripheral perfusion hence error.  Repeated BMP stat, glucose 106.  Would repeat BMP if she is symptomatic of hypoglycemia.  Stable.  Adult failure to thrive. Multifactorial related to advanced age, frail physical status and multiple severe significant comorbidities.  Discussed in detail with family and advised them that if she is not making sustained improvement and has recurrent acute decline, may wish to consider palliative care consultation for goals of care.   Procedures:  ORIF 4/11 Intubation/extubation Ultrasound-guided right thoracentesis by IR 4/12 and 4/19 Foley catheter  Consultants:   Orthopedics PCCM Cardiology IR  Discharge Instructions  Discharge Instructions    (HEART FAILURE PATIENTS) Call MD:  Anytime you have any of the following symptoms: 1) 3 pound weight gain in 24 hours or 5 pounds in 1 week 2) shortness of breath, with or without a dry hacking cough 3) swelling in the hands, feet or stomach 4) if you have to sleep on extra pillows at night in order to breathe.   Complete by:  As directed    Call MD for:  difficulty breathing, headache or visual disturbances   Complete by:  As directed    Call MD for:  extreme fatigue   Complete by:  As directed    Call MD for:  persistant dizziness or light-headedness   Complete by:  As directed    Call MD for:  redness, tenderness, or signs of infection (pain, swelling, redness, odor or green/yellow discharge around incision site)   Complete by:  As directed    Call MD for:  severe uncontrolled pain   Complete by:  As directed  Call MD for:  temperature >100.4   Complete by:  As directed    Diet - low sodium heart healthy   Complete by:  As directed    Diet - low sodium heart healthy   Complete by:  As directed    Increase activity slowly   Complete by:  As directed    Increase activity slowly   Complete by:  As directed         Medication List    STOP taking these medications   amLODipine 5 MG tablet Commonly known as:  NORVASC   metoprolol succinate 100 MG 24 hr tablet Commonly known as:  TOPROL-XL     TAKE these medications   aspirin 81 MG tablet Take 81 mg by mouth daily.   atorvastatin 40 MG tablet Commonly known as:  LIPITOR Take 1 tablet (40 mg total) by mouth daily at 6 PM. NEED OV.   enalapril 20 MG tablet Commonly known as:  VASOTEC Take 1 tablet (20 mg total) by mouth daily. Start taking on:  05/07/2017   enoxaparin 30 MG/0.3ML injection Commonly known as:  LOVENOX Inject 0.3 mLs (30 mg total) into the skin daily. For 30 day post op for DVT prophylaxis.   feeding supplement (ENSURE ENLIVE) Liqd Take 237 mLs by mouth 2 (two) times daily between meals.   furosemide 40 MG tablet Commonly known as:  LASIX TAKE 1 TABLET BY MOUTH EVERY DAY What changed:    how much to take  how to take this  when to take this   HYDROcodone-acetaminophen 5-325 MG tablet Commonly known as:  NORCO Take 1 tablet by mouth every 4 (four) hours as needed for moderate pain.   KLOR-CON M20 20 MEQ tablet Generic drug:  potassium chloride SA TAKE 1 TABLET EVERY DAY -MUST MAKE APPOINTMENT FOR FURTHER REFILLS   metoprolol tartrate 50 MG tablet Commonly known as:  LOPRESSOR Take 1 tablet (50 mg total) by mouth 2 (two) times daily.   polyethylene glycol packet Commonly known as:  MIRALAX / GLYCOLAX Take 17 g by mouth daily as needed for mild constipation.       Contact information for follow-up providers    Sheral Apley, MD Follow up in 1 week(s).   Specialty:  Orthopedic Surgery Contact information: [address] [place] [address] [phone]        Lewayne Bunting, MD. Schedule an appointment as soon as possible for a visit in 2 week(s).   Specialty:  Cardiology Contact information: [address] [place] [address] [phone]        MD  at SNF. Schedule an appointment as soon as possible for a visit in 3 day(s).   Why:  To be seen with repeat labs (CBC & BMP).           Contact information for after-discharge care    Destination    [facility] .   Service:  Skilled Nursing Contact information: [address] [place] [address] [phone]                 No Known Allergies    Procedures/Studies: Dg Chest 1 View  Result Date: 05/03/2017 CLINICAL DATA:  Status post right thoracentesis. EXAM: CHEST  1 VIEW COMPARISON:  05/02/2017 CT of the chest FINDINGS: Cardiac shadow remains enlarged. Postsurgical changes are again noted. Bilateral pleural effusions are noted left greater than right. The right effusion is significantly reduced when  compared with the prior CT examination. No definitive pneumothorax is seen. IMPRESSION: Status post right thoracentesis without pneumothorax. Mild residual effusion remains. Stable left pleural effusion when compared with the prior study. Electronically Signed   By: Alcide Clever M.D.   On: 05/03/2017 16:15   Dg Chest 1 View  Result Date: 04/22/2017 CLINICAL DATA:  Preop evaluation for right hip surgery EXAM: CHEST  1 VIEW COMPARISON:  04/05/2008 FINDINGS: Cardiac shadow remains enlarged. Postsurgical changes are again seen. Right-sided pleural effusion and atelectatic/infiltrative changes are noted. No pneumothorax is seen. No acute bony abnormality is noted. IMPRESSION: Right basilar atelectatic/infiltrative changes with associated effusion. Electronically Signed   By: Alcide Clever M.D.   On: 04/22/2017 11:56   Ct Angio Chest Pe W Or Wo Contrast  Result Date: 05/02/2017 CLINICAL DATA:  84 y/o F; Shortness of breath PE suspected, high pretest prob. EXAM: CT ANGIOGRAPHY CHEST WITH CONTRAST TECHNIQUE: Multidetector CT imaging of the chest was performed using the standard protocol during bolus administration of intravenous contrast.  Multiplanar CT image reconstructions and MIPs were obtained to evaluate the vascular anatomy. CONTRAST:  70mL ISOVUE-370 IOPAMIDOL (ISOVUE-370) INJECTION 76% COMPARISON:  05/01/2017 chest radiograph FINDINGS: Cardiovascular: Severe calcific atherosclerosis of the aorta and coronary arteries. Status post CABG with LIMA and saphenous grafts. Normal caliber thoracic aorta and main pulmonary artery. Mild cardiomegaly. Mitral valve replacement. No pericardial effusion. Satisfactory opacification of the pulmonary arteries. No pulmonary embolus identified. Mediastinum/Nodes: No enlarged mediastinal, hilar, or axillary lymph nodes. Thyroid gland, trachea, and esophagus demonstrate no significant findings. Lungs/Pleura: Large right and moderate left pleural effusion with fluid tracking along the fissures. Near complete collapse of the right lower lobe and dependent atelectasis of the left lower lobe. Centrilobular emphysema greatest in the lung apices. No pneumothorax. Upper Abdomen: Healed median sternotomy chronic postsurgical changes. No acute osseous abnormality is identified. Musculoskeletal: No chest wall abnormality. No acute or significant osseous findings. Review of the MIP images confirms the above findings. IMPRESSION: 1. No pulmonary embolus identified. 2. Large right and moderate left pleural effusions. 3. Near complete collapse of right lower lobe and dependent atelectasis in left lower lobe. 4. Severe calcific atherosclerosis of the aorta and coronary arteries. 5. Cardiomegaly. 6. Emphysema. Electronically Signed   By: Mitzi Hansen M.D.   On: 05/02/2017 17:11   Dg Chest Port 1 View  Result Date: 05/01/2017 CLINICAL DATA:  Hypertension.  Dementia.  Congestive heart failure. EXAM: PORTABLE CHEST 1 VIEW COMPARISON:  04/30/2017 FINDINGS: Similar appearance. Large bilateral effusions with dependent atelectasis. Venous hypertension with mild pulmonary edema. IMPRESSION: No change since yesterday.  Large effusions with dependent atelectasis. Mild edema. Electronically Signed   By: Paulina Fusi M.D.   On: 05/01/2017 11:49   Dg Chest Port 1 View  Result Date: 04/30/2017 CLINICAL DATA:  Pleural effusion, shortness of breath EXAM: PORTABLE CHEST 1 VIEW COMPARISON:  04/26/2017 FINDINGS: Prior CABG. Cardiomegaly. Layering bilateral effusions have increased since prior study. Increasing perihilar and lower lobe opacities and interstitial prominence, likely edema and superimposed areas of atelectasis. IMPRESSION: Increasing layering bilateral effusions, bilateral perihilar and lower lobe opacities and interstitial prominence. Findings likely reflect edema/CHF and superimposed areas of bibasilar atelectasis. Electronically Signed   By: Charlett Nose M.D.   On: 04/30/2017 07:55   Dg Chest Port 1 View  Result Date: 04/26/2017 CLINICAL DATA:  Post thoracentesis EXAM: PORTABLE CHEST 1 VIEW COMPARISON:  04/26/2017, 04/25/2017 FINDINGS: Support devices obscure the upper chest. Endotracheal tube tip is about 2.7 cm superior  to the carina. Esophageal tube tip is below the diaphragm but non included. Post sternotomy changes and valve prosthesis. Decreased right greater than left pleural effusions. No definitive right pneumothorax. Improved aeration of the right lower lung. Residual consolidation at the right middle lobe and right base. Mediastinal silhouette is exaggerated by patient rotation. Dense aortic atherosclerosis. IMPRESSION: 1. Decreased small bilateral right greater than left pleural effusions. No definitive right pneumothorax seen 2. Persistent airspace disease at the right middle lobe and right base, atelectasis versus pneumonia. Electronically Signed   By: Jasmine PangKim  Fujinaga M.D.   On: 04/26/2017 15:45   Portable Chest Xray  Result Date: 04/26/2017 CLINICAL DATA:  Endotracheal tube EXAM: PORTABLE CHEST 1 VIEW COMPARISON:  04/25/2017 FINDINGS: Cardiac shadow is stable. Postsurgical changes are again seen.  Endotracheal tube and nasogastric catheter are noted in satisfactory position. Bilateral pleural effusions are again seen right greater than left. Underlying atelectasis is present. IMPRESSION: Stable bilateral pleural effusions and atelectasis. Electronically Signed   By: Alcide CleverMark  Lukens M.D.   On: 04/26/2017 07:55   Dg Chest Port 1 View  Result Date: 04/25/2017 CLINICAL DATA:  Endotracheal tube present.  OG tube placement. EXAM: PORTABLE CHEST 1 VIEW COMPARISON:  Radiograph earlier this day at 0617 hour FINDINGS: Endotracheal tube 2.2 cm from the carina. Enteric tube in place with tip and side-port below the diaphragm in the stomach. Post median sternotomy. Unchanged cardiomegaly and mediastinal contours. Aortic atherosclerosis. Unchanged right pleural effusion and basilar airspace disease. Improved left pleural effusion. Improving bilateral airspace disease. No pneumothorax. The bones are under mineralized. IMPRESSION: 1. Endotracheal tube tip 2.2 cm from the carina. Tip and side port of the enteric tube below the diaphragm. 2. Bilateral pleural effusions, moderate on the right and small on the left, with improvement from exam earlier this day. 3. Improving aeration with improvement in bilateral airspace disease. Electronically Signed   By: Rubye OaksMelanie  Ehinger M.D.   On: 04/25/2017 21:47   Dg Chest Port 1 View  Result Date: 04/25/2017 CLINICAL DATA:  Shortness of Breath EXAM: PORTABLE CHEST 1 VIEW COMPARISON:  04/23/2017 FINDINGS: Changes of median sternotomy and valve replacement. Moderate to large right effusion is similar or slightly improved since prior study. Diffuse right lung airspace disease. Small to moderate left effusion, new since prior study with left lower lobe atelectasis or infiltrate. IMPRESSION: Moderate right effusion, slightly improved since prior study. New small to moderate left effusion. Bilateral airspace disease which could reflect atelectasis or pneumonia, right greater than left.  Electronically Signed   By: Charlett NoseKevin  Dover M.D.   On: 04/25/2017 08:59   Dg Chest Port 1 View  Result Date: 04/23/2017 CLINICAL DATA:  84 year old female with hypoxia. Subsequent encounter. EXAM: PORTABLE CHEST 1 VIEW COMPARISON:  04/22/2017 chest x-ray. FINDINGS: Post CABG and valve replacement.  Cardiomegaly. Progressive enlargement of right-sided pleural effusion now moderate to large in size with associated right lung atelectasis. Pulmonary vascular congestion. Medial left base subsegmental atelectasis. Calcified tortuous aorta.  Prominent vascular calcifications. IMPRESSION: Progressive enlargement of right-sided pleural effusion now moderate to large in size with associated right lung atelectasis. Mild atelectasis medial aspect left lung base. Pulmonary vascular congestion. Cardiomegaly post CABG and valve replacement. Aortic Atherosclerosis (ICD10-I70.0). Electronically Signed   By: Lacy DuverneySteven  Olson M.D.   On: 04/23/2017 08:45   Dg C-arm 1-60 Min  Result Date: 04/25/2017 CLINICAL DATA:  Right intertrochanteric femur fracture ORIF. EXAM: DG C-ARM 61-120 MIN; RIGHT FEMUR 2 VIEWS COMPARISON:  Right hip x-rays dated April 22, 2017. FINDINGS: Intraoperative x-rays demonstrate interval placement of a right femoral cephalomedullary rod with distal interlocking screw. Near anatomic alignment of the dominant intertrochanteric fracture fragments. The lesser trochanter remains mildly displaced medially. Right hip osteoarthritis. Vascular calcifications. IMPRESSION: 1. Improved alignment of the right intertrochanteric femur fracture status post ORIF. FLUOROSCOPY TIME:  1 minutes, 37 seconds. C-arm fluoroscopic images were obtained intraoperatively and submitted for post operative interpretation. Electronically Signed   By: Obie Dredge M.D.   On: 04/25/2017 15:46   Dg Hip Unilat W Or Wo Pelvis 2-3 Views Right  Result Date: 04/22/2017 CLINICAL DATA:  Recent fall with right leg deformity EXAM: DG HIP (WITH OR  WITHOUT PELVIS) 2-3V RIGHT COMPARISON:  None. FINDINGS: Pelvic ring is intact. Comminuted intratrochanteric fracture of the proximal right femur is noted with impaction and angulation at the fracture site. No soft tissue changes are seen. Diffuse vascular calcifications are noted. IMPRESSION: Comminuted intratrochanteric fracture of the proximal right femur. Electronically Signed   By: Alcide Clever M.D.   On: 04/22/2017 12:01   Dg Femur, Min 2 Views Right  Result Date: 04/25/2017 CLINICAL DATA:  Right intertrochanteric femur fracture ORIF. EXAM: DG C-ARM 61-120 MIN; RIGHT FEMUR 2 VIEWS COMPARISON:  Right hip x-rays dated April 22, 2017. FINDINGS: Intraoperative x-rays demonstrate interval placement of a right femoral cephalomedullary rod with distal interlocking screw. Near anatomic alignment of the dominant intertrochanteric fracture fragments. The lesser trochanter remains mildly displaced medially. Right hip osteoarthritis. Vascular calcifications. IMPRESSION: 1. Improved alignment of the right intertrochanteric femur fracture status post ORIF. FLUOROSCOPY TIME:  1 minutes, 37 seconds. C-arm fluoroscopic images were obtained intraoperatively and submitted for post operative interpretation. Electronically Signed   By: Obie Dredge M.D.   On: 04/25/2017 15:46   Ir Thoracentesis Asp Pleural Space W/img Guide  Result Date: 05/03/2017 INDICATION: Patient with shortness of breath, respiratory failure. Now with recurrent right pleural effusion. Request is made for therapeutic thoracentesis. EXAM: ULTRASOUND GUIDED THERAPEUTIC RIGHT THORACENTESIS MEDICATIONS: 10 mL 1% lidocaine COMPLICATIONS: None immediate. PROCEDURE: An ultrasound guided thoracentesis was thoroughly discussed with the patient and questions answered. The benefits, risks, alternatives and complications were also discussed. The patient understands and wishes to proceed with the procedure. Written consent was obtained. Ultrasound was performed to  localize and mark an adequate pocket of fluid in the right chest. The area was then prepped and draped in the normal sterile fashion. 1% Lidocaine was used for local anesthesia. Under ultrasound guidance a 19 gauge, 7-cm, Yueh catheter was introduced. Thoracentesis was performed. The catheter was removed and a dressing applied. FINDINGS: A total of approximately 1.2 liters of yellow fluid was removed. Samples were sent to the laboratory as requested by the clinical team. IMPRESSION: Successful ultrasound guided therapeutic right thoracentesis yielding 1.2 liters of pleural fluid. Read by: Loyce Dys PA-C Electronically Signed   By: Malachy Moan M.D.   On: 05/03/2017 16:16   US Thoracentesis Asp Pleural Space W/img Guide  Result Date: 04/26/2017 INDICATION: Shortness of breath. Respiratory failure. Right-sided pleural effusion. Request diagnostic and therapeutic thoracentesis. EXAM: ULTRASOUND GUIDED RIGHT THORACENTESIS MEDICATIONS: None. COMPLICATIONS: None immediate. PROCEDURE: An ultrasound guided thoracentesis was thoroughly discussed with the patient and questions answered. The benefits, risks, alternatives and complications were also discussed. The patient understands and wishes to proceed with the procedure. Written consent was obtained. Ultrasound was performed to localize and mark an adequate pocket of fluid in the right chest. The area was then prepped and draped in the normal sterile fashion.  1% Lidocaine was used for local anesthesia. Under ultrasound guidance a Safe-T-Centesis catheter was introduced. Thoracentesis was performed. The catheter was removed and a dressing applied. FINDINGS: A total of approximately 1 L of clear yellow fluid was removed. Samples were sent to the laboratory as requested by the clinical team. IMPRESSION: Successful ultrasound guided right thoracentesis yielding 1 L of pleural fluid. Read by: Brayton El PA-C Electronically Signed   By: Gilmer Mor D.O.   On:  04/26/2017 15:47      Subjective: Seen this morning.  Alert and oriented to person and place but not to time.  Improved compared to yesterday.  Denies complaints.  Denies dyspnea, chest pain or cough.  As per RN, no acute issues reported.  Discharge Exam:  Vitals:   05/05/17 1433 05/05/17 1942 05/05/17 2247 05/06/17 0620  BP: (!) 108/51  (!) 158/70 138/60  Pulse: 61  77 69  Resp: 19  18 16   Temp: (!) 97.5 F (36.4 C)  98.7 F (37.1 C) 97.9 F (36.6 C)  TempSrc: Oral  Oral Oral  SpO2:  98% 100% 98%  Weight:      Height:         General: Elderly female, small built, frail and chronically ill looking lying comfortably propped up in bed.  Does not appear in any distress.  Cardiovascular: S1 and S2 heard, RRR.  No JVD, murmurs or pedal edema.  Respiratory: Reduced breath sounds in the bases with occasional basal crackles.  Rest of lung fields clear to auscultation without wheezing or rhonchi.  No increased work of breathing.  Gastrointestinal: Nondistended, soft and nontender.  Normal bowel sounds heard.    Ext: Moves all extremities normally.  Neuro  alert and oriented to person and place.  No focal deficits.  Psych:  Pleasant and appropriate.       The results of significant diagnostics from this hospitalization (including imaging, microbiology, ancillary and laboratory) are listed below for reference.     Microbiology: Recent Results (from the past 240 hour(s))  Culture, body fluid-bottle     Status: None   Collection Time: 04/26/17  3:30 PM  Result Value Ref Range Status   Specimen Description PLEURAL RIGHT  Final   Special Requests NONE  Final   Culture   Final    NO GROWTH 5 DAYS Performed at [facility], [address], [place], [place] [address]    Report Status 05/01/2017 FINAL  Final  Gram stain     Status: None   Collection Time: 04/26/17  3:30 PM  Result Value Ref Range Status   Specimen Description PLEURAL RIGHT  Final   Special Requests  NONE  Final   Gram Stain   Final    FEW WBC PRESENT,BOTH PMN AND MONONUCLEAR NO ORGANISMS SEEN Performed at [facility], [address] [place], [place] [address]    Report Status 04/26/2017 FINAL  Final     Labs: CBC: Recent Labs  Lab 05/01/17 0434 05/02/17 0348 05/03/17 0712 05/03/17 1750  WBC 6.3 8.0 6.3 6.9  HGB 9.3* 9.6* 10.2* 9.7*  HCT 34.0* 36.1 37.4 36.8  MCV 90.2 90.7 91.4 92.0  PLT 180 169 135* 220   Basic Metabolic Panel: Recent Labs  Lab 05/02/17 0348 05/03/17 0712 05/04/17 0726 05/04/17 1042 05/05/17 0551  NA 143 145 142 142 142  K 3.6 3.6 4.4 3.6 3.6  CL 95* 93* 92* 90* 92*  CO2 38* 43* 37* 38* 38*  GLUCOSE  85 87 54* 106* 105*  BUN 14 14 20 19 17   CREATININE 0.60 0.60 0.65 0.68 0.56  CALCIUM 8.4* 8.5* 8.7* 8.7* 8.7*   BNP (last 3 results) Recent Labs    04/22/17 2156 05/01/17 1128  BNP 3,142.4* 2,446.5*   CBG: Recent Labs  Lab 05/04/17 0956 05/04/17 1024  GLUCAP 44* 46*   Urinalysis    Component Value Date/Time   COLORURINE YELLOW 04/25/2017 2229   APPEARANCEUR CLEAR 04/25/2017 2229   LABSPEC 1.010 04/25/2017 2229   PHURINE 5.0 04/25/2017 2229   GLUCOSEU NEGATIVE 04/25/2017 2229   HGBUR SMALL (A) 04/25/2017 2229   BILIRUBINUR NEGATIVE 04/25/2017 2229   KETONESUR 5 (A) 04/25/2017 2229   PROTEINUR NEGATIVE 04/25/2017 2229   UROBILINOGEN 0.2 04/05/2008 0629   NITRITE NEGATIVE 04/25/2017 2229   LEUKOCYTESUR NEGATIVE 04/25/2017 2229      Time coordinating discharge: 45 minutes  SIGNED:  Marcellus Scott, MD, FACP, [facility]. [facility] Pager [phone]  If 7PM-7AM, please contact night-coverage [email or web address] Password [email or web address] 05/06/2017, 1:08 PM

## 2017-05-06 NOTE — [place] FL2 (Signed)
[place] MEDICAID FL2 LEVEL OF CARE SCREENING TOOL     IDENTIFICATION  Patient Name: [name] Birthdate: [birth year] Sex: female Admission Date (Current Location): 04/22/2017  [facility] and [place] Number:  Producer, television/film/video and Address:  [facility] [place], [address], [place], [place] [address]      Provider Number: [id number]  Attending Physician Name and Address:  Elease Etienne, MD  Relative Name and Phone Number:  Desha Bitner [phone]    Current Level of Care: Hospital Recommended Level of Care: Skilled Nursing Facility Prior Approval Number:    Date Approved/Denied:   PASRR Number: [id number]  Discharge Plan: Home    Current Diagnoses: Patient Active Problem List   Diagnosis Date Noted  . Hypoxemia   . Pleural effusion   . Acute encephalopathy   . Malnutrition of moderate degree 04/27/2017  . Pressure injury of skin 04/27/2017  . Endotracheal tube present   . Pleural effusion, right 04/23/2017  . Preoperative respiratory examination   . Evaluation by psychiatric service required   . Hypoxia   . Closed displaced intertrochanteric fracture of right femur (HCC) 04/22/2017  . Hypertension, essential, benign 04/22/2017  . Acute on chronic diastolic (congestive) heart failure (HCC) 04/22/2017  . Dyslipidemia, goal LDL below 70 04/22/2017  . PVD (peripheral vascular disease) (HCC) 04/22/2017  . Dementia 04/22/2017  . AAA (abdominal aortic aneurysm) (HCC) 04/22/2017  . Acute respiratory failure with hypoxia (HCC) 04/22/2017  . CKD (chronic kidney disease), stage III (HCC) 04/22/2017  . Carotid artery stenosis 04/22/2017  . Pulmonary hypertension (HCC) 04/22/2017  . Former tobacco use 04/22/2017  . Hx of CABG 04/22/2017  . H/O mitral valve repair 06/04/2012  . CELLULITIS, HAND 08/22/2010  . RENAL ARTERY STENOSIS 09/07/2009  . HYPOKALEMIA 07/26/2009  . RHINITIS, ALLERGIC NOS 12/04/2005    Orientation RESPIRATION BLADDER  Height & Weight     Self, Time, Situation, Place  O2(2L) Incontinent, Indwelling catheter Weight: 46.3 kg (102 lb) Height:  5\' 2"  (157.5 cm)  BEHAVIORAL SYMPTOMS/MOOD NEUROLOGICAL BOWEL NUTRITION STATUS      Incontinent Diet(see discharge summary)  AMBULATORY STATUS COMMUNICATION OF NEEDS Skin   Extensive Assist Verbally Surgical wounds, Skin abrasions(closed right intertrochanteric hip fracture)                       Personal Care Assistance Level of Assistance  Bathing, Feeding, Dressing Bathing Assistance: Maximum assistance Feeding assistance: Maximum assistance Dressing Assistance: Maximum assistance     Functional Limitations Info  Sight, Hearing, Speech Sight Info: Adequate Hearing Info: Adequate Speech Info: Adequate    SPECIAL CARE FACTORS FREQUENCY  PT (By licensed PT), OT (By licensed OT)     PT Frequency: 5x week OT Frequency: 5x week            Contractures Contractures Info: Not present    Additional Factors Info  Code Status, Allergies Code Status Info: Full Code Allergies Info: No Known Allergies           Current Medications (05/06/2017):  This is the current hospital active medication list Current Facility-Administered Medications  Medication Dose Route Frequency Provider Last Rate Last Dose  . acetaminophen (TYLENOL) tablet 325-650 mg  325-650 mg Oral Q6H PRN Albina Billet III, PA-C   650 mg at 05/05/17 1215  . aspirin chewable tablet 81 mg  81 mg Oral Daily Otho Najjar, MD   81 mg at 05/06/17 1004  . atorvastatin (LIPITOR)  tablet 40 mg  40 mg Oral q1800 Albina BilletMartensen, Henry Calvin III, PA-C   40 mg at 05/05/17 1823  . bisacodyl (DULCOLAX) suppository 10 mg  10 mg Rectal Daily PRN Selmer DominionSimpson, Paula B, NP   10 mg at 05/01/17 0508  . docusate (COLACE) 50 MG/5ML liquid 100 mg  100 mg Per Tube BID PRN Selmer DominionSimpson, Paula B, NP   100 mg at 04/30/17 2137  . enalapril (VASOTEC) tablet 20 mg  20 mg Oral Daily Otho Najjarosenblatt, Robert J, MD   20 mg  at 05/06/17 1004  . enoxaparin (LOVENOX) injection 30 mg  30 mg Subcutaneous Q24H Albina BilletMartensen, Henry Calvin III, PA-C   30 mg at 05/05/17 1824  . feeding supplement (ENSURE ENLIVE) (ENSURE ENLIVE) liquid 237 mL  237 mL Oral BID BM Rai, Ripudeep K, MD   237 mL at 05/06/17 1004  . furosemide (LASIX) tablet 40 mg  40 mg Oral Daily Rollene RotundaHochrein, James, MD   40 mg at 05/06/17 1004  . iopamidol (ISOVUE-300) 61 % injection 100 mL  100 mL Intravenous Once PRN Albina BilletMartensen, Henry Calvin III, PA-C      . MEDLINE mouth rinse  15 mL Mouth Rinse BID Otho Najjarosenblatt, Robert J, MD   15 mL at 05/05/17 2302  . metoprolol tartrate (LOPRESSOR) tablet 50 mg  50 mg Per Tube BID Chilton Siandolph, Tiffany, MD   50 mg at 05/06/17 1004  . polyethylene glycol (MIRALAX / GLYCOLAX) packet 17 g  17 g Oral Daily PRN Albina BilletMartensen, Henry Calvin III, PA-C   17 g at 04/30/17 1837  . potassium chloride (KLOR-CON) packet 20 mEq  20 mEq Oral Daily Otho Najjarosenblatt, Robert J, MD   20 mEq at 05/06/17 1004     Discharge Medications: Please see discharge summary for a list of discharge medications.  Relevant Imaging Results:  Relevant Lab Results:   Additional Information SS# [id number]  [name] S QuambaRayyan, [place]

## 2017-05-06 NOTE — Clinical Social Work Placement (Signed)
   CLINICAL SOCIAL WORK PLACEMENT  NOTE  Date:  05/06/2017  Patient Details  Name: Regan Rakersnn B Leichter MRN: [id number] Date of Birth: 05/23/1932  Clinical Social Work is seeking post-discharge placement for this patient at the Skilled  Nursing Facility level of care (*CSW will initial, date and re-position this form in  chart as items are completed):  Yes   Patient/family provided with [facility] Clinical Social Work Department's list of facilities offering this level of care within the geographic area requested by the patient (or if unable, by the patient's family).  Yes   Patient/family informed of their freedom to choose among providers that offer the needed level of care, that participate in Medicare, Medicaid or managed care program needed by the patient, have an available bed and are willing to accept the patient.  Yes   Patient/family informed of [facility]'s ownership interest in [facility] and [facility], as well as of the fact that they are under no obligation to receive care at these facilities.  PASRR submitted to EDS on 05/02/17     PASRR number received on 05/02/17     Existing PASRR number confirmed on       FL2 transmitted to all facilities in geographic area requested by pt/family on 05/02/17     FL2 transmitted to all facilities within larger geographic area on       Patient informed that his/her managed care company has contracts with or will negotiate with certain facilities, including the following:        Yes   Patient/family informed of bed offers received.  Patient chooses bed at [facility] [place]     Physician recommends and patient chooses bed at      Patient to be transferred to [facility] [place] on 05/06/17.  Patient to be transferred to facility by PTAR     Patient family notified on 05/06/17 of transfer.  Name of family member notified:  Spouse, son, and daughter in law     PHYSICIAN Please prepare priority discharge summary,  including medications, Please sign FL2     Additional Comment:    _______________________________________________ Mearl LatinNadia S Adithi Gammon, LCSWA 05/06/2017, 11:38 AM

## 2017-05-06 NOTE — Progress Notes (Signed)
Patient's spouse, son, and daughter-in-law at bedside. They have selected [facility] [place] and request PTAR. MD aware.   Osborne Cascoadia Sharron Simpson LCSW [phone]

## 2017-05-07 ENCOUNTER — Other Ambulatory Visit: Payer: Self-pay

## 2017-05-07 ENCOUNTER — Inpatient Hospital Stay (HOSPITAL_COMMUNITY)
Admission: EM | Admit: 2017-05-07 | Discharge: 2017-05-09 | DRG: 698 | Disposition: A | Discharge status: Skilled Nursing Facility | Payer: Medicare Other | Attending: Internal Medicine | Admitting: Internal Medicine

## 2017-05-07 ENCOUNTER — Emergency Department (HOSPITAL_COMMUNITY): Payer: Medicare Other

## 2017-05-07 DIAGNOSIS — G9340 Encephalopathy, unspecified: Secondary | ICD-10-CM

## 2017-05-07 DIAGNOSIS — I5032 Chronic diastolic (congestive) heart failure: Secondary | ICD-10-CM | POA: Diagnosis not present

## 2017-05-07 DIAGNOSIS — Z951 Presence of aortocoronary bypass graft: Secondary | ICD-10-CM

## 2017-05-07 DIAGNOSIS — E440 Moderate protein-calorie malnutrition: Secondary | ICD-10-CM | POA: Diagnosis present

## 2017-05-07 DIAGNOSIS — Z681 Body mass index (BMI) 19 or less, adult: Secondary | ICD-10-CM

## 2017-05-07 DIAGNOSIS — R7989 Other specified abnormal findings of blood chemistry: Secondary | ICD-10-CM | POA: Diagnosis present

## 2017-05-07 DIAGNOSIS — L8990 Pressure ulcer of unspecified site, unspecified stage: Secondary | ICD-10-CM | POA: Diagnosis present

## 2017-05-07 DIAGNOSIS — Z87891 Personal history of nicotine dependence: Secondary | ICD-10-CM

## 2017-05-07 DIAGNOSIS — I110 Hypertensive heart disease with heart failure: Secondary | ICD-10-CM | POA: Diagnosis present

## 2017-05-07 DIAGNOSIS — I739 Peripheral vascular disease, unspecified: Secondary | ICD-10-CM | POA: Diagnosis present

## 2017-05-07 DIAGNOSIS — N3000 Acute cystitis without hematuria: Secondary | ICD-10-CM | POA: Diagnosis not present

## 2017-05-07 DIAGNOSIS — N390 Urinary tract infection, site not specified: Secondary | ICD-10-CM | POA: Diagnosis present

## 2017-05-07 DIAGNOSIS — Z9981 Dependence on supplemental oxygen: Secondary | ICD-10-CM

## 2017-05-07 DIAGNOSIS — T83511A Infection and inflammatory reaction due to indwelling urethral catheter, initial encounter: Secondary | ICD-10-CM | POA: Diagnosis not present

## 2017-05-07 DIAGNOSIS — I6529 Occlusion and stenosis of unspecified carotid artery: Secondary | ICD-10-CM | POA: Diagnosis present

## 2017-05-07 DIAGNOSIS — I248 Other forms of acute ischemic heart disease: Secondary | ICD-10-CM | POA: Diagnosis not present

## 2017-05-07 DIAGNOSIS — Z79899 Other long term (current) drug therapy: Secondary | ICD-10-CM

## 2017-05-07 DIAGNOSIS — Z952 Presence of prosthetic heart valve: Secondary | ICD-10-CM

## 2017-05-07 DIAGNOSIS — J449 Chronic obstructive pulmonary disease, unspecified: Secondary | ICD-10-CM | POA: Diagnosis present

## 2017-05-07 DIAGNOSIS — Y846 Urinary catheterization as the cause of abnormal reaction of the patient, or of later complication, without mention of misadventure at the time of the procedure: Secondary | ICD-10-CM | POA: Diagnosis present

## 2017-05-07 DIAGNOSIS — J90 Pleural effusion, not elsewhere classified: Secondary | ICD-10-CM | POA: Diagnosis present

## 2017-05-07 DIAGNOSIS — R946 Abnormal results of thyroid function studies: Secondary | ICD-10-CM | POA: Diagnosis present

## 2017-05-07 DIAGNOSIS — Y732 Prosthetic and other implants, materials and accessory gastroenterology and urology devices associated with adverse incidents: Secondary | ICD-10-CM | POA: Diagnosis present

## 2017-05-07 DIAGNOSIS — R0602 Shortness of breath: Secondary | ICD-10-CM | POA: Diagnosis not present

## 2017-05-07 DIAGNOSIS — L89150 Pressure ulcer of sacral region, unstageable: Secondary | ICD-10-CM | POA: Diagnosis not present

## 2017-05-07 DIAGNOSIS — F0390 Unspecified dementia without behavioral disturbance: Secondary | ICD-10-CM | POA: Diagnosis present

## 2017-05-07 DIAGNOSIS — I700 Atherosclerosis of aorta: Secondary | ICD-10-CM | POA: Diagnosis present

## 2017-05-07 DIAGNOSIS — E785 Hyperlipidemia, unspecified: Secondary | ICD-10-CM | POA: Diagnosis present

## 2017-05-07 DIAGNOSIS — L89321 Pressure ulcer of left buttock, stage 1: Secondary | ICD-10-CM | POA: Diagnosis present

## 2017-05-07 DIAGNOSIS — R778 Other specified abnormalities of plasma proteins: Secondary | ICD-10-CM | POA: Diagnosis present

## 2017-05-07 DIAGNOSIS — G9341 Metabolic encephalopathy: Secondary | ICD-10-CM | POA: Diagnosis not present

## 2017-05-07 DIAGNOSIS — Z7901 Long term (current) use of anticoagulants: Secondary | ICD-10-CM

## 2017-05-07 DIAGNOSIS — Z7982 Long term (current) use of aspirin: Secondary | ICD-10-CM

## 2017-05-07 DIAGNOSIS — Z9889 Other specified postprocedural states: Secondary | ICD-10-CM

## 2017-05-07 DIAGNOSIS — J918 Pleural effusion in other conditions classified elsewhere: Secondary | ICD-10-CM | POA: Diagnosis present

## 2017-05-07 DIAGNOSIS — I2510 Atherosclerotic heart disease of native coronary artery without angina pectoris: Secondary | ICD-10-CM | POA: Diagnosis present

## 2017-05-07 DIAGNOSIS — I714 Abdominal aortic aneurysm, without rupture: Secondary | ICD-10-CM | POA: Diagnosis present

## 2017-05-07 DIAGNOSIS — R29703 NIHSS score 3: Secondary | ICD-10-CM | POA: Diagnosis present

## 2017-05-07 DIAGNOSIS — I272 Pulmonary hypertension, unspecified: Secondary | ICD-10-CM | POA: Diagnosis present

## 2017-05-07 DIAGNOSIS — R402413 Glasgow coma scale score 13-15, at hospital admission: Secondary | ICD-10-CM | POA: Diagnosis present

## 2017-05-07 LAB — CBC WITH DIFFERENTIAL/PLATELET
BASOS ABS: 0 10*3/uL (ref 0.0–0.1)
Basophils Relative: 0 %
EOS ABS: 0.1 10*3/uL (ref 0.0–0.7)
Eosinophils Relative: 1 %
HCT: 35.6 % — ABNORMAL LOW (ref 36.0–46.0)
HEMOGLOBIN: 9.7 g/dL — AB (ref 12.0–15.0)
LYMPHS PCT: 11 %
Lymphs Abs: 0.7 10*3/uL (ref 0.7–4.0)
MCH: 24.9 pg — ABNORMAL LOW (ref 26.0–34.0)
MCHC: 27.2 g/dL — ABNORMAL LOW (ref 30.0–36.0)
MCV: 91.5 fL (ref 78.0–100.0)
Monocytes Absolute: 0.3 10*3/uL (ref 0.1–1.0)
Monocytes Relative: 5 %
NEUTROS PCT: 83 %
Neutro Abs: 5.7 10*3/uL (ref 1.7–7.7)
PLATELETS: 196 10*3/uL (ref 150–400)
RBC: 3.89 MIL/uL (ref 3.87–5.11)
RDW: 22.1 % — ABNORMAL HIGH (ref 11.5–15.5)
WBC: 6.8 10*3/uL (ref 4.0–10.5)

## 2017-05-07 LAB — I-STAT VENOUS BLOOD GAS, ED
Acid-Base Excess: 20 mmol/L — ABNORMAL HIGH (ref 0.0–2.0)
Bicarbonate: 45.8 mmol/L — ABNORMAL HIGH (ref 20.0–28.0)
O2 SAT: 73 %
TCO2: 47 mmol/L — ABNORMAL HIGH (ref 22–32)
pCO2, Ven: 57.4 mmHg (ref 44.0–60.0)
pH, Ven: 7.51 — ABNORMAL HIGH (ref 7.250–7.430)
pO2, Ven: 36 mmHg (ref 32.0–45.0)

## 2017-05-07 LAB — COMPREHENSIVE METABOLIC PANEL
ALBUMIN: 2.4 g/dL — AB (ref 3.5–5.0)
ALT: 14 U/L (ref 14–54)
ANION GAP: 13 (ref 5–15)
AST: 26 U/L (ref 15–41)
Alkaline Phosphatase: 66 U/L (ref 38–126)
BUN: 17 mg/dL (ref 6–20)
CALCIUM: 8.7 mg/dL — AB (ref 8.9–10.3)
CO2: 34 mmol/L — AB (ref 22–32)
Chloride: 95 mmol/L — ABNORMAL LOW (ref 101–111)
Creatinine, Ser: 0.59 mg/dL (ref 0.44–1.00)
GFR calc non Af Amer: >60 mL/min (ref >60)
Glucose, Bld: 75 mg/dL (ref 65–99)
POTASSIUM: 4.2 mmol/L (ref 3.5–5.1)
SODIUM: 142 mmol/L (ref 135–145)
Total Bilirubin: 1.4 mg/dL — ABNORMAL HIGH (ref 0.3–1.2)
Total Protein: 5.2 g/dL — ABNORMAL LOW (ref 6.5–8.1)

## 2017-05-07 LAB — URINALYSIS, ROUTINE W REFLEX MICROSCOPIC
Bilirubin Urine: NEGATIVE
GLUCOSE, UA: NEGATIVE mg/dL
Ketones, ur: NEGATIVE mg/dL
Nitrite: NEGATIVE
PH: 7 (ref 5.0–8.0)
Protein, ur: NEGATIVE mg/dL
SPECIFIC GRAVITY, URINE: 1.012 (ref 1.005–1.030)
WBC, UA: >50 WBC/hpf — ABNORMAL HIGH (ref 0–5)

## 2017-05-07 LAB — BRAIN NATRIURETIC PEPTIDE: B Natriuretic Peptide: 517 pg/mL — ABNORMAL HIGH (ref 0.0–100.0)

## 2017-05-07 LAB — TROPONIN I: TROPONIN I: 0.04 ng/mL — AB (ref <0.03)

## 2017-05-07 MED ORDER — SODIUM CHLORIDE 0.9 % IV SOLN
Freq: Once | INTRAVENOUS | Status: AC
  Administered 2017-05-07: 23:00:00 via INTRAVENOUS

## 2017-05-07 MED ORDER — FENTANYL CITRATE (PF) 100 MCG/2ML IJ SOLN: 50.0000 ug | Freq: Once | INTRAMUSCULAR | Status: DC

## 2017-05-07 MED ORDER — IBUPROFEN 400 MG PO TABS: 400.0000 mg | ORAL_TABLET | Freq: Four times a day (QID) | ORAL | Status: DC | PRN

## 2017-05-07 MED ORDER — SODIUM CHLORIDE 0.9% FLUSH
3.0000 mL | Freq: Two times a day (BID) | INTRAVENOUS | Status: DC
  Administered 2017-05-08 – 2017-05-09 (×3): 3 mL via INTRAVENOUS

## 2017-05-07 MED ORDER — FUROSEMIDE 40 MG PO TABS
40.0000 mg | ORAL_TABLET | Freq: Every day | ORAL | Status: DC
  Administered 2017-05-08 – 2017-05-09 (×2): 40 mg via ORAL
  Filled 2017-05-07 (×2): qty 1

## 2017-05-07 MED ORDER — POTASSIUM CHLORIDE CRYS ER 20 MEQ PO TBCR
20.0000 meq | EXTENDED_RELEASE_TABLET | Freq: Every day | ORAL | Status: DC
  Administered 2017-05-08 – 2017-05-09 (×2): 20 meq via ORAL
  Filled 2017-05-07 (×2): qty 1

## 2017-05-07 MED ORDER — ENOXAPARIN SODIUM 30 MG/0.3ML ~~LOC~~ SOLN
30.0000 mg | SUBCUTANEOUS | Status: DC
  Administered 2017-05-08 – 2017-05-09 (×2): 30 mg via SUBCUTANEOUS
  Filled 2017-05-07 (×2): qty 0.3

## 2017-05-07 MED ORDER — IPRATROPIUM-ALBUTEROL 0.5-2.5 (3) MG/3ML IN SOLN
3.0000 mL | Freq: Once | RESPIRATORY_TRACT | Status: AC
  Administered 2017-05-07: 3 mL via RESPIRATORY_TRACT
  Filled 2017-05-07: qty 3

## 2017-05-07 MED ORDER — SODIUM CHLORIDE 0.9 % IV SOLN
1.0000 g | Freq: Once | INTRAVENOUS | Status: AC
  Administered 2017-05-07: 1 g via INTRAVENOUS
  Filled 2017-05-07: qty 10

## 2017-05-07 MED ORDER — HALOPERIDOL LACTATE 5 MG/ML IJ SOLN
2.0000 mg | Freq: Once | INTRAMUSCULAR | Status: AC
  Administered 2017-05-07: 2 mg via INTRAVENOUS
  Filled 2017-05-07: qty 1

## 2017-05-07 MED ORDER — POLYETHYLENE GLYCOL 3350 17 G PO PACK
17.0000 g | PACK | Freq: Every day | ORAL | Status: DC | PRN
Start: 2017-05-07 — End: 2017-05-09

## 2017-05-07 MED ORDER — ONDANSETRON HCL 4 MG/2ML IJ SOLN
4.0000 mg | Freq: Once | INTRAMUSCULAR | Status: AC
Start: 2017-05-07 — End: 2017-05-07
  Administered 2017-05-07: 4 mg via INTRAVENOUS
  Filled 2017-05-07: qty 2

## 2017-05-07 MED ORDER — ONDANSETRON HCL 4 MG/2ML IJ SOLN: 4.0000 mg | Freq: Four times a day (QID) | INTRAMUSCULAR | Status: DC | PRN

## 2017-05-07 MED ORDER — ASPIRIN 81 MG PO CHEW
81.0000 mg | CHEWABLE_TABLET | Freq: Every day | ORAL | Status: DC
  Administered 2017-05-08 – 2017-05-09 (×2): 81 mg via ORAL
  Filled 2017-05-07 (×2): qty 1

## 2017-05-07 MED ORDER — ACETAMINOPHEN 325 MG PO TABS: 650.0000 mg | ORAL_TABLET | Freq: Four times a day (QID) | ORAL | Status: DC | PRN

## 2017-05-07 MED ORDER — HYDROCODONE-ACETAMINOPHEN 5-325 MG PO TABS
1.0000 | ORAL_TABLET | ORAL | Status: DC | PRN
  Administered 2017-05-09: 1 via ORAL
  Filled 2017-05-07: qty 1

## 2017-05-07 MED ORDER — ONDANSETRON HCL 4 MG PO TABS: 4.0000 mg | ORAL_TABLET | Freq: Four times a day (QID) | ORAL | Status: DC | PRN

## 2017-05-07 MED ORDER — METOPROLOL TARTRATE 50 MG PO TABS
50.0000 mg | ORAL_TABLET | Freq: Every day | ORAL | Status: DC
  Administered 2017-05-08 – 2017-05-09 (×2): 50 mg via ORAL
  Filled 2017-05-07 (×2): qty 1

## 2017-05-07 MED ORDER — ADULT MULTIVITAMIN W/MINERALS CH
1.0000 | ORAL_TABLET | Freq: Every day | ORAL | Status: DC
  Administered 2017-05-08 – 2017-05-09 (×2): 1 via ORAL
  Filled 2017-05-07 (×2): qty 1

## 2017-05-07 MED ORDER — POTASSIUM CHLORIDE IN NACL 20-0.9 MEQ/L-% IV SOLN
INTRAVENOUS | Status: DC
  Administered 2017-05-08 – 2017-05-09 (×2): via INTRAVENOUS
  Filled 2017-05-07 (×2): qty 1000

## 2017-05-07 MED ORDER — ATORVASTATIN CALCIUM 40 MG PO TABS
40.0000 mg | ORAL_TABLET | Freq: Every day | ORAL | Status: DC
  Administered 2017-05-08: 40 mg via ORAL
  Filled 2017-05-07: qty 1

## 2017-05-07 MED ORDER — ACETAMINOPHEN 650 MG RE SUPP: 650.0000 mg | Freq: Four times a day (QID) | RECTAL | Status: DC | PRN

## 2017-05-07 MED ORDER — ENALAPRIL MALEATE 20 MG PO TABS
20.0000 mg | ORAL_TABLET | Freq: Every day | ORAL | Status: DC
  Administered 2017-05-08 – 2017-05-09 (×2): 20 mg via ORAL
  Filled 2017-05-07 (×2): qty 1

## 2017-05-07 MED ORDER — FENTANYL CITRATE (PF) 100 MCG/2ML IJ SOLN
25.0000 ug | Freq: Once | INTRAMUSCULAR | Status: AC
  Administered 2017-05-07: 25 ug via INTRAVENOUS
  Filled 2017-05-07: qty 2

## 2017-05-07 NOTE — ED Notes (Signed)
Pt resting , with vital signs stable.

## 2017-05-07 NOTE — ED Notes (Signed)
Pt agitated, attempting to pull line out, also pulling off breathing treatment. Provider aware, meds ordered

## 2017-05-07 NOTE — ED Triage Notes (Signed)
Pt arrives to ED from [facility] with complaints of SOB and worsening back pain since yesterday. EMS reports pt has had a decline in mental status over the past few months, per husband. Pt's baseline is oriented to person, generalized confusion. Pt was more confused than normal last night, not oriented to place and was given haldol at [facility]. Pt has chronic COPD and is on 2L baseline. Hx of AAA. Pt placed in position of comfort with bed locked and lowered, call bell in reach.

## 2017-05-07 NOTE — ED Provider Notes (Signed)
Emergency Department Provider Note   I have reviewed the triage vital signs and the nursing notes.   HISTORY  Chief Complaint Shortness of Breath   HPI [name] is a 84 y.o. female with PMH of AAA, HLD, HTN, COPD on 2L O2, CAD, dCHF, PVD, and pleural effusions s/p thoracentesis with recent admission for ORIF of right intertrochanteric femur fracture presents to the emergency department by EMS from her rehab facility with shortness of breath and confusion.  Patient is on 2 L of oxygen at baseline.  According to EMS the patient has been complaining of worsening shortness of breath along with back pain starting yesterday.  The patient describes it as midline and seeming to be in the center.  She had some confusion worse at night and required Haldol at [facility] [employer] center.  She denies any fevers, productive cough, hemoptysis.  Denies any chest pain.  No modifying factors.   Level 5 caveat: Mild confusion.   Past Medical History:  Diagnosis Date  . AAA (abdominal aortic aneurysm) (HCC)   . Carotid artery stenosis   . Chronic diastolic heart failure (HCC)   . Coronary atherosclerosis of native coronary artery   . Dementia   . Hypercholesteremia   . Hypertension, essential, benign   . PVD (peripheral vascular disease) (HCC)   . Rhinitis, allergic    NOS    Patient Active Problem List   Diagnosis Date Noted  . Acute lower UTI 05/08/2017  . Elevated TSH 05/08/2017  . Elevated troponin likely secondary to demand ischemia 05/08/2017  . Acute metabolic encephalopathy 05/07/2017  . Hypoxemia   . Pleural effusion   . Acute encephalopathy   . Malnutrition of moderate degree 04/27/2017  . Pressure injury of skin 04/27/2017  . Endotracheal tube present   . Pleural effusion, right 04/23/2017  . Preoperative respiratory examination   . Evaluation by psychiatric service required   . Hypoxia   . Closed displaced intertrochanteric fracture of right femur (HCC) 04/22/2017  .  Hypertension, essential, benign 04/22/2017  . Acute on chronic diastolic (congestive) heart failure (HCC) 04/22/2017  . Dyslipidemia, goal LDL below 70 04/22/2017  . PVD (peripheral vascular disease) (HCC) 04/22/2017  . Dementia 04/22/2017  . AAA (abdominal aortic aneurysm) (HCC) 04/22/2017  . Acute respiratory failure with hypoxia (HCC) 04/22/2017  . CKD (chronic kidney disease), stage III (HCC) 04/22/2017  . Carotid artery stenosis 04/22/2017  . Pulmonary hypertension (HCC) 04/22/2017  . Former tobacco use 04/22/2017  . Hx of CABG 04/22/2017  . H/O mitral valve repair 06/04/2012  . CELLULITIS, HAND 08/22/2010  . RENAL ARTERY STENOSIS 09/07/2009  . HYPOKALEMIA 07/26/2009  . RHINITIS, ALLERGIC NOS 12/04/2005    Past Surgical History:  Procedure Laterality Date  . CAROTID ENDARTERECTOMY  03/2008   Right  . COLONOSCOPY  1996   in [place]  . CORONARY ARTERY BYPASS GRAFT  05/22/2006   Left int mammary to distal left ant descending CA, spahenous vein graft to ramus intermediate branch  . INTRAMEDULLARY (IM) NAIL INTERTROCHANTERIC Right 04/25/2017   Procedure: INTRAMEDULLARY (IM) NAIL INTERTROCHANTRIC;  Surgeon: Sheral Apley, MD;  Location: [facility] OR;  Service: Orthopedics;  Laterality: Right;  . IR THORACENTESIS ASP PLEURAL SPACE W/IMG GUIDE  05/03/2017  . MITRAL VALVE REPAIR  05/22/2006   Quadrangular resection of post leaflet w/ sliding leaflet annuloplaty and 24mm Edwards Physio ring      Allergies Patient has no known allergies.  Family History  Problem Relation Age of Onset  .  Heart attack Mother     Social History Social History   Tobacco Use  . Smoking status: Former Smoker    Types: Cigarettes    Last attempt to quit: 01/15/2006    Years since quitting: 11.3  . Smokeless tobacco: Never Used  Substance Use Topics  . Alcohol use: Not Currently  . Drug use: Never    Review of Systems  Constitutional: No fever/chills. Positive mild confusion.  Eyes: No visual  changes. ENT: No sore throat. Cardiovascular: Denies chest pain. Respiratory: Positive shortness of breath. Gastrointestinal: No abdominal pain.  No nausea, no vomiting.  No diarrhea.  No constipation. Genitourinary: Negative for dysuria. Musculoskeletal: Positive for back pain. Skin: Negative for rash. Neurological: Negative for headaches, focal weakness or numbness.  10-point ROS otherwise negative.  ____________________________________________   PHYSICAL EXAM:  VITAL SIGNS: ED Triage Vitals  Enc Vitals Group     BP 05/07/17 1512 (!) 152/57     Pulse Rate 05/07/17 1512 66     Resp 05/07/17 1512 14     Temp 05/07/17 1512 97.8 F (36.6 C)     Temp Source 05/07/17 1512 Oral     SpO2 05/07/17 1512 100 %     Weight 05/07/17 1506 102 lb (46.3 kg)     Height 05/07/17 1506  (1.575 m)   Constitutional: Alert and oriented. Well appearing and in no acute distress. Eyes: Conjunctivae are normal.  Head: Atraumatic. Nose: No congestion/rhinnorhea. Mouth/Throat: Mucous membranes are dry.  Neck: No stridor.   Cardiovascular: Normal rate, regular rhythm. Good peripheral circulation. Grossly normal heart sounds.   Respiratory: Increased respiratory effort.  No retractions. Lungs diminished at the bases.  Gastrointestinal: Soft and nontender. No distention.  Musculoskeletal: No lower extremity tenderness nor edema. No gross deformities of extremities. Neurologic:  Normal speech and language. No gross focal neurologic deficits are appreciated.  Skin:  Skin is warm, dry and intact. No rash noted.  ____________________________________________   LABS (all labs ordered are listed, but only abnormal results are displayed)  Labs Reviewed  COMPREHENSIVE METABOLIC PANEL - Abnormal; Notable for the following components:      Result Value   Chloride 95 (*)    CO2 34 (*)    Calcium 8.7 (*)    Total Protein 5.2 (*)    Albumin 2.4 (*)    Total Bilirubin 1.4 (*)    All other components  within normal limits  BRAIN NATRIURETIC PEPTIDE - Abnormal; Notable for the following components:   B Natriuretic Peptide 517.0 (*)    All other components within normal limits  TROPONIN I - Abnormal; Notable for the following components:   Troponin I 0.04 (*)    All other components within normal limits  CBC WITH DIFFERENTIAL/PLATELET - Abnormal; Notable for the following components:   Hemoglobin 9.7 (*)    HCT 35.6 (*)    MCH 24.9 (*)    MCHC 27.2 (*)    RDW 22.1 (*)    All other components within normal limits  URINALYSIS, ROUTINE W REFLEX MICROSCOPIC - Abnormal; Notable for the following components:   Color, Urine AMBER (*)    APPearance CLOUDY (*)    Hgb urine dipstick SMALL (*)    Leukocytes, UA LARGE (*)    WBC, UA >50 (*)    Bacteria, UA MANY (*)    All other components within normal limits  TSH - Abnormal; Notable for the following components:   TSH 7.786 (*)    All other  components within normal limits  BASIC METABOLIC PANEL - Abnormal; Notable for the following components:   Chloride 94 (*)    CO2 36 (*)    Calcium 8.4 (*)    All other components within normal limits  CBC - Abnormal; Notable for the following components:   RBC 3.63 (*)    Hemoglobin 9.2 (*)    HCT 33.1 (*)    MCH 25.3 (*)    MCHC 27.8 (*)    RDW 22.6 (*)    All other components within normal limits  TROPONIN I - Abnormal; Notable for the following components:   Troponin I 0.05 (*)    All other components within normal limits  TROPONIN I - Abnormal; Notable for the following components:   Troponin I 0.04 (*)    All other components within normal limits  I-STAT VENOUS BLOOD GAS, ED - Abnormal; Notable for the following components:   pH, Ven 7.510 (*)    Bicarbonate 45.8 (*)    TCO2 47 (*)    Acid-Base Excess 20.0 (*)    All other components within normal limits  URINE CULTURE  GLUCOSE, CAPILLARY  BLOOD GAS, VENOUS  TROPONIN I  T4, FREE    ____________________________________________  EKG   EKG Interpretation  Date/Time:  Tuesday May 07 2017 17:56:25 EDT Ventricular Rate:  79 PR Interval:    QRS Duration: 89 QT Interval:  408 QTC Calculation: 468 R Axis:   60 Text Interpretation:  Sinus rhythm Low voltage, extremity leads Consider left ventricular hypertrophy No STEMI.  Confirmed by Alona Bene [phone]) on 05/07/2017 6:03:00 PM       ____________________________________________  RADIOLOGY  Dg Chest 2 View  Result Date: 05/07/2017 CLINICAL DATA:  Shortness of breath, history chronic diastolic heart failure, hypertension, CABG, former smoker EXAM: CHEST - 2 VIEW COMPARISON:  05/03/2017 FINDINGS: Enlargement of cardiac silhouette post CABG and MVR. Extensive atherosclerotic calcifications aorta, carotid and subclavian arteries. Mediastinal contours and pulmonary vascularity normal. Bibasilar effusions and atelectasis greater on RIGHT. Underlying emphysematous changes. No pneumothorax. Bones demineralized. IMPRESSION: COPD changes with bibasilar pleural effusions and atelectasis. Electronically Signed   By: Ulyses Southward M.D.   On: 05/07/2017 16:51    ____________________________________________   PROCEDURES  Procedure(s) performed:   .Critical Care Performed by: Maia Plan, MD Authorized by: Maia Plan, MD   Critical care provider statement:    Critical care time (minutes):  35   Critical care time was exclusive of:  Separately billable procedures and treating other patients and teaching time   Critical care was necessary to treat or prevent imminent or life-threatening deterioration of the following conditions:  Respiratory failure and CNS failure or compromise   Critical care was time spent personally by me on the following activities:  Blood draw for specimens, development of treatment plan with patient or surrogate, evaluation of patient's response to treatment, examination of patient, obtaining  history from patient or surrogate, ordering and performing treatments and interventions, ordering and review of laboratory studies, ordering and review of radiographic studies, pulse oximetry, review of old charts and re-evaluation of patient's condition   I assumed direction of critical care for this patient from another provider in my specialty: no       ____________________________________________   INITIAL IMPRESSION / ASSESSMENT AND PLAN / ED COURSE  Pertinent labs & imaging results that were available during my care of the patient were reviewed by me and considered in my medical decision making (see chart for details).  Patient presents to the emergency department for evaluation of shortness of breath, back pain, slight increased confusion.  She does have history of abdominal AAA but her pain symptoms are in the thoracic back area.  She was discharged yesterday from the hospitalist service after ORIF of a right intertrochanteric femur fracture.  Her postop course was complicated by pleural effusions which required thoracentesis and management of her diastolic CHF.  She had slight elevated troponins during that admission were thought to be secondary to her CHF.  Patient has mild increased work of breathing but no acute volume overload signs on exam.  Given her recent hospitalization the possibility for DVT/PE is slightly elevated.  She does not have significant wheezing and agree think this is a COPD exacerbation.  Denies any chest pain but husband does note some mild confusion which could limit my history. Plan for labs, CXR, and reassess. No concern for dissection or abdominal vascular issue.   Patient required Haldol for gentle sedation with pulling off he O2 and neb treatments. She is unable to be redirected verbally.   Labs reviewed with UTI on UA which could be source of confusion. Updated family at bedside. Patient given Ceftriaxone and IVF. Complaining of back pain but suspect more  chronic pain after discussion with patient and family.   Discussed patient's case with Hospitalist to request admission. Patient and family (if present) updated with plan. Care transferred to Hospitalist service.  I reviewed all nursing notes, vitals, pertinent old records, EKGs, labs, imaging (as available).  ____________________________________________  FINAL CLINICAL IMPRESSION(S) / ED DIAGNOSES  Final diagnoses:  Encephalopathy acute  Acute cystitis without hematuria  SOB (shortness of breath)     MEDICATIONS GIVEN DURING THIS VISIT:  Medications  enalapril (VASOTEC) tablet 20 mg (20 mg Oral Given 05/08/17 0932)  enoxaparin (LOVENOX) injection 30 mg (30 mg Subcutaneous Given 05/08/17 0919)  atorvastatin (LIPITOR) tablet 40 mg (has no administration in time range)  furosemide (LASIX) tablet 40 mg (40 mg Oral Given 05/08/17 0919)  HYDROcodone-acetaminophen (NORCO/VICODIN) 5-325 MG per tablet 1 tablet (has no administration in time range)  potassium chloride SA (K-DUR,KLOR-CON) CR tablet 20 mEq (20 mEq Oral Given 05/08/17 0919)  metoprolol tartrate (LOPRESSOR) tablet 50 mg (50 mg Oral Given 05/08/17 0919)  polyethylene glycol (MIRALAX / GLYCOLAX) packet 17 g (has no administration in time range)  aspirin chewable tablet 81 mg (81 mg Oral Given 05/08/17 0919)  sodium chloride flush (NS) 0.9 % injection 3 mL (3 mLs Intravenous Given 05/08/17 0920)  0.9 % NaCl with KCl 20 mEq/ L  infusion ( Intravenous Transfusing/Transfer 05/08/17 0251)  acetaminophen (TYLENOL) tablet 650 mg (has no administration in time range)    Or  acetaminophen (TYLENOL) suppository 650 mg (has no administration in time range)  ibuprofen (ADVIL,MOTRIN) tablet 400 mg (has no administration in time range)  ondansetron (ZOFRAN) tablet 4 mg (has no administration in time range)    Or  ondansetron (ZOFRAN) injection 4 mg (has no administration in time range)  multivitamin with minerals tablet 1 tablet (1 tablet Oral  Given 05/08/17 0919)  cefTRIAXone (ROCEPHIN) 1 g in sodium chloride 0.9 % 100 mL IVPB (has no administration in time range)  ipratropium-albuterol (DUONEB) 0.5-2.5 (3) MG/3ML nebulizer solution 3 mL (3 mLs Nebulization Given 05/07/17 1850)  haloperidol lactate (HALDOL) injection 2 mg (2 mg Intravenous Given 05/07/17 1945)  cefTRIAXone (ROCEPHIN) 1 g in sodium chloride 0.9 % 100 mL IVPB (0 g Intravenous Stopped 05/07/17 2224)  ondansetron (ZOFRAN) injection  4 mg (4 mg Intravenous Given 05/07/17 2300)  0.9 %  sodium chloride infusion ( Intravenous Stopped 05/08/17 0041)  fentaNYL (SUBLIMAZE) injection 25 mcg (25 mcg Intravenous Given 05/07/17 2300)    Note:  This document was prepared using Dragon voice recognition software and may include unintentional dictation errors.  Alona Bene, MD Emergency Medicine    Antjuan Rothe, Arlyss Repress, MD 05/08/17 [phone]

## 2017-05-07 NOTE — ED Notes (Signed)
Pt placed on mittens bilaterally. Pt repositioned on left side.

## 2017-05-07 NOTE — ED Notes (Signed)
attempted IV stick x2 unsuccessfully

## 2017-05-07 NOTE — H&P (Signed)
History and Physical    Melissa Rakersnn B Finck ZOX:[id number] DOB: 06/14/1932 DOA: 05/07/2017  PCP: Currently resident at [facility] in [place]  Patient coming from: SNF  I have personally briefly reviewed patient[facility]  Chief Complaint: Shortness of breath and confusion  HPI: [name] is a 84 y.o. female with medical history significant for pulmonary hypertension, probable dementia, recent right hip fracture, mitral valve repair, acute on chronic diastolic congestive heart failure, carotid artery stenosis, abdominal aortic aneurysm, hypoxia, with a recent admission to our facility for ORIF of right intertrochanteric femur fracture who then had postoperative respiratory failure due to postoperative lethargy and was on a ventilator.  Her stay was extremely complicated and that she developed pleural effusions and required thoracentesis as well as acute encephalopathy possibly worsened due to medications and hypercarbia as well as sundowning which may be related to probable dementia.  She discharged from our facility yesterday and it was noted in the discharge summary that she had acute urinary retention requiring an in and out cath on 417 and placement of a Foley which was discontinued on 421.  Nursing had reported that the patient was voiding well without difficulty.  She presented this evening complaining of shortness of breath and confusion being brought in by EMS.  She is on 2 L of oxygen at baseline and the patient has been content complaining of worsening shortness of breath along with back pain which began yesterday.  Pain was midline and seemed to be in the center as well.  She had some confusion which worsened at night and she required Haldol at [facility].  She has had no fevers or productive cough hemoptysis chest pain or modifying factors.  She denies any dysuria urinary frequency or urgency.  History is completely unknown reliable due to the patient's  confusion.  Family is present to provide additional history and most of the history is obtained from the ER physician and the records it [facility] as well as our medical record here.  In our emergency department the patient was fully evaluated the only significant clinical findings were shortness of breath and confusion associated with an abnormal urinalysis consistent with a urinary tract infection.  Urine culture has been sent.  I was asked to further evaluate the patient.  Review of Systems: Patient unable to provide any history due to her confusion   Past Medical History:  Diagnosis Date  . AAA (abdominal aortic aneurysm) (HCC)   . Carotid artery stenosis   . Chronic diastolic heart failure (HCC)   . Coronary atherosclerosis of native coronary artery   . Dementia   . Hypercholesteremia   . Hypertension, essential, benign   . PVD (peripheral vascular disease) (HCC)   . Rhinitis, allergic    NOS    Past Surgical History:  Procedure Laterality Date  . CAROTID ENDARTERECTOMY  03/2008   Right  . COLONOSCOPY  1996   in [place]  . CORONARY ARTERY BYPASS GRAFT  05/22/2006   Left int mammary to distal left ant descending CA, spahenous vein graft to ramus intermediate branch  . INTRAMEDULLARY (IM) NAIL INTERTROCHANTERIC Right 04/25/2017   Procedure: INTRAMEDULLARY (IM) NAIL INTERTROCHANTRIC;  Surgeon: Sheral ApleyMurphy, Timothy D, MD;  Location: [facility] OR;  Service: Orthopedics;  Laterality: Right;  . IR THORACENTESIS ASP PLEURAL SPACE W/IMG GUIDE  05/03/2017  . MITRAL VALVE REPAIR  05/22/2006   Quadrangular resection of post leaflet w/ sliding leaflet annuloplaty and 24mm Edwards [facility]io ring  reports that she quit smoking about 11 years ago. Her smoking use included cigarettes. She has never used smokeless tobacco. She reports that she drank alcohol. She reports that she does not use drugs.  No Known Allergies  Family History  Problem Relation Age of Onset  . Heart attack Mother      Prior to  Admission medications   Medication Sig Start Date End Date Taking? Authorizing Provider  aspirin 81 MG tablet Take 81 mg by mouth daily.     Yes [provider]  atorvastatin (LIPITOR) 40 MG tablet Take 1 tablet (40 mg total) by mouth daily at 6 PM. NEED OV. 01/07/17  Yes Crenshaw, [name], MD  enalapril (VASOTEC) 20 MG tablet Take 1 tablet (20 mg total) by mouth daily. 05/07/17  Yes Hongalgi, Maximino Greenland, MD  enoxaparin (LOVENOX) 30 MG/0.3ML injection Inject 0.3 mLs (30 mg total) into the skin daily. For 30 day post op for DVT prophylaxis. 04/25/17  Yes Martensen, Lucretia Kern III, PA-C  furosemide (LASIX) 40 MG tablet TAKE 1 TABLET BY MOUTH EVERY DAY Patient taking differently: 40mg  by mouth daily for fluid 03/23/16  Yes Crenshaw, [name], MD  HYDROcodone-acetaminophen (NORCO) 5-325 MG tablet Take 1 tablet by mouth every 4 (four) hours as needed for moderate pain. 04/25/17  Yes Martensen, Lucretia Kern III, PA-C  KLOR-CON M20 20 MEQ tablet TAKE 1 TABLET EVERY DAY -MUST MAKE APPOINTMENT FOR FURTHER REFILLS 07/10/16  Yes Lewayne Bunting, MD  LORazepam (ATIVAN) 1 MG tablet Take 1 mg by mouth every 6 (six) hours as needed for anxiety.   Yes [provider]  metoprolol tartrate (LOPRESSOR) 50 MG tablet Take 1 tablet (50 mg total) by mouth 2 (two) times daily. Patient taking differently: Take 50 mg by mouth daily.  05/01/17  Yes Rai, Ripudeep K, MD  polyethylene glycol (MIRALAX / GLYCOLAX) packet Take 17 g by mouth daily as needed for mild constipation. 05/01/17  Yes Rai, Ripudeep K, MD  feeding supplement, ENSURE ENLIVE, (ENSURE ENLIVE) LIQD Take 237 mLs by mouth 2 (two) times daily between meals. Patient not taking: Reported on 05/07/2017 05/01/17   Cathren Harsh, MD    Physical Exam: Vitals:   05/07/17 2143 05/07/17 2230 05/07/17 2350 05/08/17 0000  BP: (!) 144/69  (!) 136/57 (!) 117/50  Pulse: 93 79 84 86  Resp: (!) 24 17 19  (!) 22  Temp:      TempSrc:      SpO2: (!) 89% 100%  100%    Weight:      Height:        Constitutional: NAD, calm, comfortable Vitals:   05/07/17 2143 05/07/17 2230 05/07/17 2350 05/08/17 0000  BP: (!) 144/69  (!) 136/57 (!) 117/50  Pulse: 93 79 84 86  Resp: (!) 24 17 19  (!) 22  Temp:      TempSrc:      SpO2: (!) 89% 100%  100%  Weight:      Height:       Eyes: PERRL, lids and conjunctivae normal ENMT: Mucous membranes are moist. Posterior pharynx clear of any exudate or lesions.Normal dentition.  Neck: normal, supple, no masses, no thyromegaly Respiratory: clear to auscultation bilaterally, no wheezing, no crackles. Normal respiratory effort. No accessory muscle use.  Cardiovascular: Regular rate and rhythm, no murmurs / rubs / gallops. No extremity edema. 2+ pedal pulses. No carotid bruits.  Abdomen: no tenderness, no masses palpated. No hepatosplenomegaly. Bowel sounds positive.  Musculoskeletal: no clubbing /  cyanosis. No joint deformity upper and lower extremities. Good ROM, no contractures. Normal muscle tone.  She is wearing mittens on her hands Skin: no rashes, lesions, ulcers. No induration Neurologic: CN 2-12 grossly intact. Sensation intact, DTR normal.  She is confused and unable to follow commands Psychiatric: Normal judgment and insight.  She is alert and oriented to name only.  Normal mood.    Labs on Admission: I have personally reviewed following labs and imaging studies  CBC: Recent Labs  Lab 05/01/17 0434 05/02/17 0348 05/03/17 0712 05/03/17 1750 05/07/17 1729  WBC 6.3 8.0 6.3 6.9 6.8  NEUTROABS  --   --   --   --  5.7  HGB 9.3* 9.6* 10.2* 9.7* 9.7*  HCT 34.0* 36.1 37.4 36.8 35.6*  MCV 90.2 90.7 91.4 92.0 91.5  PLT 180 169 135* 220 196   Basic Metabolic Panel: Recent Labs  Lab 05/03/17 0712 05/04/17 0726 05/04/17 1042 05/05/17 0551 05/07/17 1729  NA 145 142 142 142 142  K 3.6 4.4 3.6 3.6 4.2  CL 93* 92* 90* 92* 95*  CO2 43* 37* 38* 38* 34*  GLUCOSE 87 54* 106* 105* 75  BUN 14 20 19 17 17    CREATININE 0.60 0.65 0.68 0.56 0.59  CALCIUM 8.5* 8.7* 8.7* 8.7* 8.7*   GFR: Estimated Creatinine Clearance: 39.6 mL/min (by C-G formula based on SCr of 0.59 mg/dL). Liver Function Tests: Recent Labs  Lab 05/07/17 1729  AST 26  ALT 14  ALKPHOS 66  BILITOT 1.4*  PROT 5.2*  ALBUMIN 2.4*   Cardiac Enzymes: Recent Labs  Lab 05/07/17 1729  TROPONINI 0.04*   CBG: Recent Labs  Lab 05/04/17 0956 05/04/17 1024  GLUCAP 44* 46*   Urine analysis:    Component Value Date/Time   COLORURINE AMBER (A) 05/07/2017 2035   APPEARANCEUR CLOUDY (A) 05/07/2017 2035   LABSPEC 1.012 05/07/2017 2035   PHURINE 7.0 05/07/2017 2035   GLUCOSEU NEGATIVE 05/07/2017 2035   HGBUR SMALL (A) 05/07/2017 2035   BILIRUBINUR NEGATIVE 05/07/2017 2035   KETONESUR NEGATIVE 05/07/2017 2035   PROTEINUR NEGATIVE 05/07/2017 2035   UROBILINOGEN 0.2 04/05/2008 0629   NITRITE NEGATIVE 05/07/2017 2035   LEUKOCYTESUR LARGE (A) 05/07/2017 2035    Radiological Exams on Admission: Dg Chest 2 View  Result Date: 05/07/2017 CLINICAL DATA:  Shortness of breath, history chronic diastolic heart failure, hypertension, CABG, former smoker EXAM: CHEST - 2 VIEW COMPARISON:  05/03/2017 FINDINGS: Enlargement of cardiac silhouette post CABG and MVR. Extensive atherosclerotic calcifications aorta, carotid and subclavian arteries. Mediastinal contours and pulmonary vascularity normal. Bibasilar effusions and atelectasis greater on RIGHT. Underlying emphysematous changes. No pneumothorax. Bones demineralized. IMPRESSION: COPD changes with bibasilar pleural effusions and atelectasis. Electronically Signed   By: Ulyses Southward M.D.   On: 05/07/2017 16:51    EKG: Independently reviewed.  Sinus rhythm with left ventricular hypertrophy  Assessment/Plan Principal Problem:   Acute metabolic encephalopathy Active Problems:   Acute lower UTI   Dementia   Pulmonary hypertension (HCC)   Pleural effusion, right   Malnutrition of moderate  degree   H/O mitral valve repair    1.  Acute metabolic encephalopathy: Patient is very confused and not acting herself.  I believe that this is due to acute infection likely brought on by urinary tract infection.  Patient does have a history of urinary retention.  She will be admitted into the hospital we will start her on ceftriaxone will check bladder scans every 6 hours post void and monitor  closely.  If patient is having difficulty voiding will consider urological evaluation.  2.  Acute lower urinary tract infection: We will start treatment with Rocephin 1 g IV daily and monitor closely.  Await urine culture data.  3.  Dementia: Probable.  Having great difficulty with mentation at this point.  There is concern that she may have had dementia for quite some time.  We will continue to monitor.  4.  Pulmonary hypertension: This was diagnosed last admission continue current plans.  Patient does not appear to require Lasix at this point.  5.  Pleural effusion on the right: It has not significantly increased in size since the thoracentesis.  We will continue to monitor.  6.  Malnutrition of moderate degree: Patient likely not eating very well due to her encephalopathy and confusion.  Hopefully when she clears up after treatment she will start eating better.  6.  History of mitral valve repair: This is noted.  7.  Clinical decline: Given patient's recent clinical decline she would benefit from consultation with palliative care.    DVT prophylaxis: Lovenox Code Status: Full code Family Communication: No family present at the time of admission.  Patient would benefit from palliative care consultation.  Unable to request that through family at this time. Disposition Plan[facility] Consults called: None Admission status: Inpatient   Lahoma Crocker MD FACP [facility] Pager [phone]  If 7PM-7AM, please contact  night-coverage [email or web address] Password [email or web address]  05/08/2017, 1:11 AM

## 2017-05-07 NOTE — ED Notes (Signed)
Pt pulling of oxygen cord and pulse ox. mittens ordered from materials and management for placement.  Provider aware.

## 2017-05-08 ENCOUNTER — Encounter (HOSPITAL_COMMUNITY): Payer: Self-pay | Admitting: Internal Medicine

## 2017-05-08 DIAGNOSIS — G9340 Encephalopathy, unspecified: Secondary | ICD-10-CM

## 2017-05-08 DIAGNOSIS — F0281 Dementia in other diseases classified elsewhere with behavioral disturbance: Secondary | ICD-10-CM | POA: Diagnosis not present

## 2017-05-08 DIAGNOSIS — F0390 Unspecified dementia without behavioral disturbance: Secondary | ICD-10-CM | POA: Diagnosis present

## 2017-05-08 DIAGNOSIS — N3000 Acute cystitis without hematuria: Secondary | ICD-10-CM | POA: Diagnosis not present

## 2017-05-08 DIAGNOSIS — R278 Other lack of coordination: Secondary | ICD-10-CM | POA: Diagnosis not present

## 2017-05-08 DIAGNOSIS — R402413 Glasgow coma scale score 13-15, at hospital admission: Secondary | ICD-10-CM | POA: Diagnosis present

## 2017-05-08 DIAGNOSIS — Z951 Presence of aortocoronary bypass graft: Secondary | ICD-10-CM | POA: Diagnosis not present

## 2017-05-08 DIAGNOSIS — I110 Hypertensive heart disease with heart failure: Secondary | ICD-10-CM | POA: Diagnosis present

## 2017-05-08 DIAGNOSIS — I272 Pulmonary hypertension, unspecified: Secondary | ICD-10-CM | POA: Diagnosis not present

## 2017-05-08 DIAGNOSIS — L89150 Pressure ulcer of sacral region, unstageable: Secondary | ICD-10-CM | POA: Diagnosis present

## 2017-05-08 DIAGNOSIS — L8945 Pressure ulcer of contiguous site of back, buttock and hip, unstageable: Secondary | ICD-10-CM | POA: Diagnosis not present

## 2017-05-08 DIAGNOSIS — I248 Other forms of acute ischemic heart disease: Secondary | ICD-10-CM | POA: Diagnosis present

## 2017-05-08 DIAGNOSIS — S72141D Displaced intertrochanteric fracture of right femur, subsequent encounter for closed fracture with routine healing: Secondary | ICD-10-CM | POA: Diagnosis not present

## 2017-05-08 DIAGNOSIS — M6281 Muscle weakness (generalized): Secondary | ICD-10-CM | POA: Diagnosis not present

## 2017-05-08 DIAGNOSIS — L89321 Pressure ulcer of left buttock, stage 1: Secondary | ICD-10-CM | POA: Diagnosis present

## 2017-05-08 DIAGNOSIS — G9341 Metabolic encephalopathy: Secondary | ICD-10-CM | POA: Diagnosis not present

## 2017-05-08 DIAGNOSIS — R778 Other specified abnormalities of plasma proteins: Secondary | ICD-10-CM | POA: Diagnosis present

## 2017-05-08 DIAGNOSIS — I130 Hypertensive heart and chronic kidney disease with heart failure and stage 1 through stage 4 chronic kidney disease, or unspecified chronic kidney disease: Secondary | ICD-10-CM | POA: Diagnosis not present

## 2017-05-08 DIAGNOSIS — I5032 Chronic diastolic (congestive) heart failure: Secondary | ICD-10-CM | POA: Diagnosis present

## 2017-05-08 DIAGNOSIS — R748 Abnormal levels of other serum enzymes: Secondary | ICD-10-CM

## 2017-05-08 DIAGNOSIS — Y732 Prosthetic and other implants, materials and accessory gastroenterology and urology devices associated with adverse incidents: Secondary | ICD-10-CM | POA: Diagnosis present

## 2017-05-08 DIAGNOSIS — R2681 Unsteadiness on feet: Secondary | ICD-10-CM | POA: Diagnosis not present

## 2017-05-08 DIAGNOSIS — I5033 Acute on chronic diastolic (congestive) heart failure: Secondary | ICD-10-CM | POA: Diagnosis not present

## 2017-05-08 DIAGNOSIS — Z9981 Dependence on supplemental oxygen: Secondary | ICD-10-CM | POA: Diagnosis not present

## 2017-05-08 DIAGNOSIS — I2510 Atherosclerotic heart disease of native coronary artery without angina pectoris: Secondary | ICD-10-CM | POA: Diagnosis present

## 2017-05-08 DIAGNOSIS — J90 Pleural effusion, not elsewhere classified: Secondary | ICD-10-CM | POA: Diagnosis not present

## 2017-05-08 DIAGNOSIS — R7989 Other specified abnormal findings of blood chemistry: Secondary | ICD-10-CM | POA: Diagnosis present

## 2017-05-08 DIAGNOSIS — N390 Urinary tract infection, site not specified: Secondary | ICD-10-CM | POA: Diagnosis not present

## 2017-05-08 DIAGNOSIS — Y846 Urinary catheterization as the cause of abnormal reaction of the patient, or of later complication, without mention of misadventure at the time of the procedure: Secondary | ICD-10-CM | POA: Diagnosis present

## 2017-05-08 DIAGNOSIS — T83511A Infection and inflammatory reaction due to indwelling urethral catheter, initial encounter: Secondary | ICD-10-CM | POA: Diagnosis present

## 2017-05-08 DIAGNOSIS — Z681 Body mass index (BMI) 19 or less, adult: Secondary | ICD-10-CM | POA: Diagnosis not present

## 2017-05-08 DIAGNOSIS — R29703 NIHSS score 3: Secondary | ICD-10-CM | POA: Diagnosis present

## 2017-05-08 DIAGNOSIS — I700 Atherosclerosis of aorta: Secondary | ICD-10-CM | POA: Diagnosis present

## 2017-05-08 DIAGNOSIS — J918 Pleural effusion in other conditions classified elsewhere: Secondary | ICD-10-CM | POA: Diagnosis present

## 2017-05-08 DIAGNOSIS — I739 Peripheral vascular disease, unspecified: Secondary | ICD-10-CM | POA: Diagnosis present

## 2017-05-08 DIAGNOSIS — R946 Abnormal results of thyroid function studies: Secondary | ICD-10-CM | POA: Diagnosis present

## 2017-05-08 DIAGNOSIS — I6529 Occlusion and stenosis of unspecified carotid artery: Secondary | ICD-10-CM | POA: Diagnosis present

## 2017-05-08 DIAGNOSIS — E440 Moderate protein-calorie malnutrition: Secondary | ICD-10-CM | POA: Diagnosis not present

## 2017-05-08 DIAGNOSIS — J449 Chronic obstructive pulmonary disease, unspecified: Secondary | ICD-10-CM | POA: Diagnosis present

## 2017-05-08 DIAGNOSIS — E785 Hyperlipidemia, unspecified: Secondary | ICD-10-CM | POA: Diagnosis present

## 2017-05-08 DIAGNOSIS — N183 Chronic kidney disease, stage 3 (moderate): Secondary | ICD-10-CM | POA: Diagnosis not present

## 2017-05-08 DIAGNOSIS — R0602 Shortness of breath: Secondary | ICD-10-CM | POA: Diagnosis not present

## 2017-05-08 LAB — BASIC METABOLIC PANEL
ANION GAP: 10 (ref 5–15)
BUN: 16 mg/dL (ref 6–20)
CHLORIDE: 94 mmol/L — AB (ref 101–111)
CO2: 36 mmol/L — ABNORMAL HIGH (ref 22–32)
CREATININE: 0.6 mg/dL (ref 0.44–1.00)
Calcium: 8.4 mg/dL — ABNORMAL LOW (ref 8.9–10.3)
GFR calc non Af Amer: >60 mL/min (ref >60)
Glucose, Bld: 95 mg/dL (ref 65–99)
POTASSIUM: 3.9 mmol/L (ref 3.5–5.1)
SODIUM: 140 mmol/L (ref 135–145)

## 2017-05-08 LAB — CBC
HEMATOCRIT: 33.1 % — AB (ref 36.0–46.0)
HEMOGLOBIN: 9.2 g/dL — AB (ref 12.0–15.0)
MCH: 25.3 pg — ABNORMAL LOW (ref 26.0–34.0)
MCHC: 27.8 g/dL — ABNORMAL LOW (ref 30.0–36.0)
MCV: 91.2 fL (ref 78.0–100.0)
PLATELETS: 199 10*3/uL (ref 150–400)
RBC: 3.63 MIL/uL — AB (ref 3.87–5.11)
RDW: 22.6 % — ABNORMAL HIGH (ref 11.5–15.5)
WBC: 6.6 10*3/uL (ref 4.0–10.5)

## 2017-05-08 LAB — GLUCOSE, CAPILLARY
GLUCOSE-CAPILLARY: 78 mg/dL (ref 65–99)
Glucose-Capillary: 137 mg/dL — ABNORMAL HIGH (ref 65–99)
Glucose-Capillary: 129 mg/dL — ABNORMAL HIGH (ref 65–99)

## 2017-05-08 LAB — TROPONIN I
Troponin I: 0.05 ng/mL (ref <0.03)
Troponin I: 0.04 ng/mL (ref <0.03)
Troponin I: 0.04 ng/mL (ref <0.03)

## 2017-05-08 LAB — T4, FREE: FREE T4: 1.28 ng/dL — AB (ref 0.61–1.12)

## 2017-05-08 LAB — TSH: TSH: 7.786 u[IU]/mL — ABNORMAL HIGH (ref 0.350–4.500)

## 2017-05-08 MED ORDER — COLLAGENASE 250 UNIT/GM EX OINT
TOPICAL_OINTMENT | Freq: Every day | CUTANEOUS | Status: DC
  Administered 2017-05-08 – 2017-05-09 (×2): via TOPICAL
  Filled 2017-05-08: qty 30

## 2017-05-08 MED ORDER — SODIUM CHLORIDE 0.9 % IV SOLN
1.0000 g | INTRAVENOUS | Status: DC
  Administered 2017-05-08: 1 g via INTRAVENOUS
  Filled 2017-05-08 (×2): qty 10

## 2017-05-08 NOTE — Progress Notes (Addendum)
Progress Note    [name]  ZOX:[id number] DOB: [birth year]  DOA: 05/07/2017 PCP: Patient, No Pcp Per    Brief Narrative:   Chief complaint: Shortness of breath and confusion  Medical records reviewed and are as summarized below:  [name] is an 84 y.o. female with PMH of pulmonary hypertension, dementia, mitral valve repair, acute on chronic diastolic CHF, carotid artery stenosis, AAA with recent hospitalization for right intertrochanteric femur fracture status post ORIF complicated by postoperative respiratory failure requiring intubation.  Thoracentesis was done during that hospital stay for treatment of pleural effusion.  She was discharged to a SNF 05/07/2017 and readmitted on the same day to evaluate shortness of breath and confusion.  Initial work-up in the ED showed findings concerning for UTI.  Assessment/Plan:   Principal Problem:   Acute metabolic encephalopathy in the setting of an acute lower UTI Patient had a Foley catheter during her previous hospitalization due to urinary retention.  She was voiding well at discharge, but upon readmission, was noted to have an abnormal urinalysis concerning for UTI.  She was started on ceftriaxone with bladder scanning every 6 hours to ensure she is not developing recurrent urinary retention. Encephalopathy appears to be improving. PT/OT evaluations requested.  Active Problems:   Elevated troponin likely secondary to demand ischemia New problem.  Suspect this is secondary to demand ischemia.  2D echo done on 04/23/2017 showed an EF of 60-65% with no regional wall motion abnormality. No complaints of chest pain. Would not work this up further unless the patient becomes symptomatic.    Elevated TSH   Appears to be a new problem.  We will check free T4.  If she is hypothyroid, this may be contributing to her confusion.    H/O mitral valve repair Stable.    Dementia Presumed diagnosis given encephalopathy.    Pulmonary  hypertension (HCC)/abnormal chest x-ray: Bilateral pleural effusions Continue to monitor respiratory status. Currently asymptomatic. Chest x-ray personally reviewed and shows cardiomegaly, calcifications in the great vessels, bilateral pleural effusions with underlying emphysematous changes. Underwent thoracentesis during prior hospitalization. Effusions appear to be relatively stable since then.      Malnutrition of moderate degree Body mass index is 18.66 kg/m.     Pressure injury, unstageable to the left buttocks/stage I pressure injury to buttocks New problem: Wounds to buttocks noted by nursing. We'll request wound care consultation for further recommendations.   Family Communication/Anticipated D/C date and plan/Code Status   DVT prophylaxis: Lovenox ordered. Code Status: Full Code.  Family Communication: Husband and son updated at the bedside. Disposition Plan: Likely will be stable to return to SNF on 05/09/17.   Medical Consultants:    None.   Anti-Infectives:    Rocephin 05/08/17--->  Subjective:   Patient denies complaints. Specifically, she denies chest pain, shortness of breath, nausea/vomiting. Says she feels better.  Objective:    Vitals:   05/08/17 0000 05/08/17 0200 05/08/17 0230 05/08/17 0328  BP: (!) 117/50 (!) 124/99 (!) 124/99 (!) 140/124  Pulse: 86 78 81 81  Resp: (!) 22 19 (!) 26 16  Temp:    98.2 F (36.8 C)  TempSrc:    Oral  SpO2: 100% 100% 90% 98%  Weight:      Height:        Intake/Output Summary (Last 24 hours) at 05/08/2017 0926 Last data filed at 05/08/2017 0900 Gross per 24 hour  Intake 517.5 ml  Output 50 ml  Net 467.5 ml  Filed Weights   05/07/17 1506  Weight: 46.3 kg (102 lb)    Exam: General: No acute distress. Cardiovascular: Heart sounds show a regular rate, and rhythm. No gallops or rubs. No murmurs. No JVD. Lungs: Clear to auscultation bilaterally with good air movement. No rales, rhonchi or wheezes. Abdomen:  Soft, nontender, nondistended with normal active bowel sounds. No masses. No hepatosplenomegaly. Neurological: Alert and oriented 2. Moves all extremities 4 with equal strength. Cranial nerves II through XII grossly intact. Skin: Warm and dry. Pressure injuries to buttocks as described in nursing notes. Extremities: No clubbing or cyanosis. No edema. Pedal pulses 2+. Psychiatric: Mood and affect are normal. Insight and judgment are fair.   Data Reviewed:   I have personally reviewed following labs and imaging studies:  Labs: Labs show the following:   Basic Metabolic Panel: Recent Labs  Lab 05/04/17 0726 05/04/17 1042 05/05/17 0551 05/07/17 1729 05/08/17 0147  NA 142 142 142 142 140  K 4.4 3.6 3.6 4.2 3.9  CL 92* 90* 92* 95* 94*  CO2 37* 38* 38* 34* 36*  GLUCOSE 54* 106* 105* 75 95  BUN 20 19 17 17 16   CREATININE 0.65 0.68 0.56 0.59 0.60  CALCIUM 8.7* 8.7* 8.7* 8.7* 8.4*   GFR Estimated Creatinine Clearance: 39.6 mL/min (by C-G formula based on SCr of 0.6 mg/dL). Liver Function Tests: Recent Labs  Lab 05/07/17 1729  AST 26  ALT 14  ALKPHOS 66  BILITOT 1.4*  PROT 5.2*  ALBUMIN 2.4*    CBC: Recent Labs  Lab 05/02/17 0348 05/03/17 0712 05/03/17 1750 05/07/17 1729 05/08/17 0147  WBC 8.0 6.3 6.9 6.8 6.6  NEUTROABS  --   --   --  5.7  --   HGB 9.6* 10.2* 9.7* 9.7* 9.2*  HCT 36.1 37.4 36.8 35.6* 33.1*  MCV 90.7 91.4 92.0 91.5 91.2  PLT 169 135* 220 196 199   Cardiac Enzymes: Recent Labs  Lab 05/07/17 1729 05/08/17 0147 05/08/17 0646  TROPONINI 0.04* 0.05* 0.04*   BNP (last 3 results) No results for input(s): PROBNP in the last 8760 hours. CBG: Recent Labs  Lab 05/04/17 0956 05/04/17 1024 05/08/17 0815  GLUCAP 44* 46* 78   Thyroid function studies: Recent Labs    05/08/17 0147  TSH 7.786*    Microbiology No results found for this or any previous visit (from the past 240 hour(s)).  Procedures and diagnostic studies:  Dg Chest 2  View  Result Date: 05/07/2017 CLINICAL DATA:  Shortness of breath, history chronic diastolic heart failure, hypertension, CABG, former smoker EXAM: CHEST - 2 VIEW COMPARISON:  05/03/2017 FINDINGS: Enlargement of cardiac silhouette post CABG and MVR. Extensive atherosclerotic calcifications aorta, carotid and subclavian arteries. Mediastinal contours and pulmonary vascularity normal. Bibasilar effusions and atelectasis greater on RIGHT. Underlying emphysematous changes. No pneumothorax. Bones demineralized. IMPRESSION: COPD changes with bibasilar pleural effusions and atelectasis. Electronically Signed   By: Ulyses SouthwardMark  Boles M.D.   On: 05/07/2017 16:51    Medications:   . aspirin  81 mg Oral Daily  . atorvastatin  40 mg Oral q1800  . enalapril  20 mg Oral Daily  . enoxaparin  30 mg Subcutaneous Q24H  . furosemide  40 mg Oral Daily  . metoprolol tartrate  50 mg Oral Daily  . multivitamin with minerals  1 tablet Oral Daily  . potassium chloride SA  20 mEq Oral Daily  . sodium chloride flush  3 mL Intravenous Q12H   Continuous Infusions: . 0.9 % NaCl  with KCl 20 mEq / L 75 mL/hr at 05/08/17 0046     LOS: 0 days   Hillery Aldo  [facility] Pager [phone]. If unable to reach me by pager, please call my cell phone at [phone].  *Please refer to [email or web address], password [email or web address] to get updated schedule on who will round on this patient, as hospitalists switch teams weekly. If 7PM-7AM, please contact night-coverage at [email or web address], password [email or web address] for any overnight needs.  05/08/2017, 9:26 AM

## 2017-05-08 NOTE — Consult Note (Signed)
WOC Nurse wound consult note Reason for Consult: Consult requested for buttocks/sacrum.  [facility]. Wound type: Unstageable pressure injury to sacrum/left buttock; difficult to determine exact location since pt is turned over while up in the chair during assessment. Pressure Injury POA: Yes Measurement: 1.5X1.5cm Wound bed: 100% yellow slough Drainage (amount, consistency, odor) no odor, small amt tan drainage Periwound: intact skin surrounding Dressing procedure/placement/frequency: Begin Santyl ointment for enzymatic debridement of nonviable tissue and foam dressing to protect from further injury. Discussed plan of care with patient and family members at the bedside. Please re-consult if further assistance is needed.  Thank-you,  Cammie Mcgeeawn Zae Kirtz MSN, RN, CWOCN, UdellWCN-AP, CNS [phone]

## 2017-05-08 NOTE — Clinical Social Work Note (Signed)
Clinical Social Work Assessment  Patient Details  Name: [name] MRN: [id number] Date of Birth: 03/10/1932  Date of referral:  05/08/17               Reason for consult:  Facility Placement                Permission sought to share information with:  Facility Medical sales representativeContact Representative, Family Supports Permission granted to share information::  No  Name::     [name]  Agency::  SNFs  Relationship::  Son  Contact Information:  [phone]  Housing/Transportation Living arrangements for the past 2 months:  Skilled Nursing Facility, Single Family Home Source of Information:  Spouse, Adult Children Patient Interpreter Needed:  None Criminal Activity/Legal Involvement Pertinent to Current Situation/Hospitalization:  No - Comment as needed Significant Relationships:  Adult Children, Spouse Lives with:  Spouse Do you feel safe going back to the place where you live?  Yes Need for family participation in patient care:  Yes (Comment)  Care giving concerns:  CSW received consult for possible SNF placement at time of discharge. CSW spoke with patient's son regarding PT recommendation of SNF placement at time of discharge. Patient's son reported that patient discharged from the hospital to [facility] on Monday, but they would like a different facility because it is too far for patient's [place] Patient's son expressed understanding of PT recommendation and is agreeable to SNF placement at time of discharge. CSW to continue to follow and assist with discharge planning needs.   Social Worker assessment / plan:  CSW spoke with patient's [facility]  Employment status:  Retired Health and safety inspectornsurance information:  Medicare PT Recommendations:  Not assessed at this time Information / Referral to community resources:  Skilled Nursing Facility  Patient/Family's Response to care:  Patient's son recognizes need for rehab before returning  home and [place] agree[place]. [facility], or [facility]. He requests a private room, which [facility] does not have. He reports understanding and plans to take patient back to [place] after rehab once he is able to find a facility there.  Patient/Family's Understanding of and Emotional Response to Diagnosis, Current Treatment, and Prognosis:  Patient/family is realistic regarding therapy needs and expressed being hopeful for a different SNF placement. Patient's son expressed understanding of CSW role and discharge process as well as medical condition. No questions/concerns about plan or treatment.    Emotional Assessment Appearance:  Appears stated age Attitude/Demeanor/Rapport:  Unable to Assess Affect (typically observed):  Unable to Assess Orientation:  Fluctuating Orientation (Suspected and/or reported Sundowners), Oriented to Self, Oriented to Place Alcohol / Substance use:  Not Applicable Psych involvement (Current and /or in the community):  No (Comment)  Discharge Needs  Concerns to be addressed:  Care Coordination Readmission within the last 30 days:  Yes Current discharge risk:  None Barriers to Discharge:  Continued Medical Work up   [name], LCSWA 05/08/2017, 3:39 PM

## 2017-05-08 NOTE — [place] FL2 (Signed)
[place] MEDICAID FL2 LEVEL OF CARE SCREENING TOOL     IDENTIFICATION  Patient Name: [name] Birthdate: Mar 04, 1932 Sex: female Admission Date (Current Location): 05/07/2017  [facility] and [place] Number:  Producer, television/film/video and Address:  [facility], [address], [place], [place] [address]      Provider Number: [id number]  Attending Physician Name and Address:  Rama, Maryruth Bun, MD  Relative Name and Phone Number:  Dynastee Brummell [phone]    Current Level of Care: Hospital Recommended Level of Care: Skilled Nursing Facility Prior Approval Number:    Date Approved/Denied:   PASRR Number: [id number]  Discharge Plan: Home    Current Diagnoses: Patient Active Problem List   Diagnosis Date Noted  . Acute lower UTI 05/08/2017  . Elevated TSH 05/08/2017  . Elevated troponin likely secondary to demand ischemia 05/08/2017  . Acute metabolic encephalopathy 05/07/2017  . Hypoxemia   . Pleural effusion, bilateral   . Acute encephalopathy   . Malnutrition of moderate degree 04/27/2017  . Pressure injury of skin 04/27/2017  . Endotracheal tube present   . Preoperative respiratory examination   . Evaluation by psychiatric service required   . Hypoxia   . Closed displaced intertrochanteric fracture of right femur (HCC) 04/22/2017  . Hypertension, essential, benign 04/22/2017  . Acute on chronic diastolic (congestive) heart failure (HCC) 04/22/2017  . Dyslipidemia, goal LDL below 70 04/22/2017  . PVD (peripheral vascular disease) (HCC) 04/22/2017  . Dementia 04/22/2017  . AAA (abdominal aortic aneurysm) (HCC) 04/22/2017  . Acute respiratory failure with hypoxia (HCC) 04/22/2017  . CKD (chronic kidney disease), stage III (HCC) 04/22/2017  . Carotid artery stenosis 04/22/2017  . Pulmonary hypertension (HCC) 04/22/2017  . Former tobacco use 04/22/2017  . Hx of CABG 04/22/2017  . H/O mitral valve repair 06/04/2012  . CELLULITIS, HAND  08/22/2010  . RENAL ARTERY STENOSIS 09/07/2009  . HYPOKALEMIA 07/26/2009  . RHINITIS, ALLERGIC NOS 12/04/2005    Orientation RESPIRATION BLADDER Height & Weight     Self, Place  O2(Nasal cannula 2L) Incontinent Weight: 46.3 kg (102 lb) Height:  5\' 2"  (157.5 cm)  BEHAVIORAL SYMPTOMS/MOOD NEUROLOGICAL BOWEL NUTRITION STATUS      Incontinent Diet(Please see DC Summary)  AMBULATORY STATUS COMMUNICATION OF NEEDS Skin   Extensive Assist Verbally Surgical wounds, PU Stage and Appropriate Care(Unstageable on buttocks;Closed incision on hip)                       Personal Care Assistance Level of Assistance  Bathing, Feeding, Dressing Bathing Assistance: Maximum assistance Feeding assistance: Maximum assistance Dressing Assistance: Maximum assistance     Functional Limitations Info  Sight, Hearing, Speech Sight Info: Adequate Hearing Info: Adequate Speech Info: Adequate    SPECIAL CARE FACTORS FREQUENCY  PT (By licensed PT), OT (By licensed OT)     PT Frequency: 5x/week OT Frequency: 3x/week            Contractures Contractures Info: Not present    Additional Factors Info  Code Status, Allergies Code Status Info: Full Allergies Info: NKA           Current Medications (05/08/2017):  This is the current hospital active medication list Current Facility-Administered Medications  Medication Dose Route Frequency Provider Last Rate Last Dose  . 0.9 % NaCl with KCl 20 mEq/ L  infusion   Intravenous Continuous Lahoma Crocker, MD 75 mL/hr at 05/08/17 0046    . acetaminophen (TYLENOL) tablet  650 mg  650 mg Oral Q6H PRN Lahoma CrockerSheehan, Theresa C, MD       Or  . acetaminophen (TYLENOL) suppository 650 mg  650 mg Rectal Q6H PRN Lahoma CrockerSheehan, Theresa C, MD      . aspirin chewable tablet 81 mg  81 mg Oral Daily Lahoma CrockerSheehan, Theresa C, MD   81 mg at 05/08/17 0919  . atorvastatin (LIPITOR) tablet 40 mg  40 mg Oral q1800 Lahoma CrockerSheehan, Theresa C, MD      . cefTRIAXone (ROCEPHIN) 1 g in sodium  chloride 0.9 % 100 mL IVPB  1 g Intravenous Q24H Lahoma CrockerSheehan, Theresa C, MD      . collagenase (SANTYL) ointment   Topical Daily Rama, Maryruth Bunhristina P, MD      . enalapril (VASOTEC) tablet 20 mg  20 mg Oral Daily Lahoma CrockerSheehan, Theresa C, MD   20 mg at 05/08/17 0932  . enoxaparin (LOVENOX) injection 30 mg  30 mg Subcutaneous Q24H Lahoma CrockerSheehan, Theresa C, MD   30 mg at 05/08/17 0919  . furosemide (LASIX) tablet 40 mg  40 mg Oral Daily Lahoma CrockerSheehan, Theresa C, MD   40 mg at 05/08/17 0919  . HYDROcodone-acetaminophen (NORCO/VICODIN) 5-325 MG per tablet 1 tablet  1 tablet Oral Q4H PRN Lahoma CrockerSheehan, Theresa C, MD      . ibuprofen (ADVIL,MOTRIN) tablet 400 mg  400 mg Oral Q6H PRN Lahoma CrockerSheehan, Theresa C, MD      . metoprolol tartrate (LOPRESSOR) tablet 50 mg  50 mg Oral Daily Lahoma CrockerSheehan, Theresa C, MD   50 mg at 05/08/17 [id number]  . multivitamin with minerals tablet 1 tablet  1 tablet Oral Daily Lahoma CrockerSheehan, Theresa C, MD   1 tablet at 05/08/17 0919  . ondansetron (ZOFRAN) tablet 4 mg  4 mg Oral Q6H PRN Lahoma CrockerSheehan, Theresa C, MD       Or  . ondansetron [facility](ZOFRAN) injection 4 mg  4 mg Intravenous Q6H PRN Lahoma CrockerSheehan, Theresa C, MD      . polyethylene glycol (MIRALAX / GLYCOLAX) packet 17 g  17 g Oral Daily PRN Lahoma CrockerSheehan, Theresa C, MD      . potassium chloride SA (K-DUR,KLOR-CON) CR tablet 20 mEq  20 mEq Oral Daily Lahoma CrockerSheehan, Theresa C, MD   20 mEq at 05/08/17 0919  . sodium chloride flush (NS) 0.9 % injection 3 mL  3 mL Intravenous Q12H Lahoma CrockerSheehan, Theresa C, MD   3 mL at 05/08/17 0920     Discharge Medications: Please see discharge summary for a list of discharge medications.  Relevant Imaging Results:  Relevant Lab Results:   Additional Information SS# [id number]  Jamisyn Langer S WalterhillRayyan, [place]

## 2017-05-08 NOTE — ED Notes (Signed)
Second IV start on patient, not able to draw blood from second line. Two unsuccessful straight sticks attempts with butterfly needle. Lab will be paged.

## 2017-05-09 DIAGNOSIS — N390 Urinary tract infection, site not specified: Secondary | ICD-10-CM | POA: Diagnosis not present

## 2017-05-09 DIAGNOSIS — I739 Peripheral vascular disease, unspecified: Secondary | ICD-10-CM | POA: Diagnosis not present

## 2017-05-09 DIAGNOSIS — N183 Chronic kidney disease, stage 3 (moderate): Secondary | ICD-10-CM | POA: Diagnosis not present

## 2017-05-09 DIAGNOSIS — S72141D Displaced intertrochanteric fracture of right femur, subsequent encounter for closed fracture with routine healing: Secondary | ICD-10-CM | POA: Diagnosis not present

## 2017-05-09 DIAGNOSIS — I130 Hypertensive heart and chronic kidney disease with heart failure and stage 1 through stage 4 chronic kidney disease, or unspecified chronic kidney disease: Secondary | ICD-10-CM | POA: Diagnosis not present

## 2017-05-09 DIAGNOSIS — R0602 Shortness of breath: Secondary | ICD-10-CM

## 2017-05-09 DIAGNOSIS — M6281 Muscle weakness (generalized): Secondary | ICD-10-CM | POA: Diagnosis not present

## 2017-05-09 DIAGNOSIS — Z87891 Personal history of nicotine dependence: Secondary | ICD-10-CM | POA: Diagnosis not present

## 2017-05-09 DIAGNOSIS — N3000 Acute cystitis without hematuria: Secondary | ICD-10-CM | POA: Diagnosis not present

## 2017-05-09 DIAGNOSIS — R7989 Other specified abnormal findings of blood chemistry: Secondary | ICD-10-CM | POA: Diagnosis not present

## 2017-05-09 DIAGNOSIS — E440 Moderate protein-calorie malnutrition: Secondary | ICD-10-CM

## 2017-05-09 DIAGNOSIS — E785 Hyperlipidemia, unspecified: Secondary | ICD-10-CM | POA: Diagnosis not present

## 2017-05-09 DIAGNOSIS — Z79899 Other long term (current) drug therapy: Secondary | ICD-10-CM | POA: Diagnosis not present

## 2017-05-09 DIAGNOSIS — I469 Cardiac arrest, cause unspecified: Secondary | ICD-10-CM | POA: Diagnosis not present

## 2017-05-09 DIAGNOSIS — R278 Other lack of coordination: Secondary | ICD-10-CM | POA: Diagnosis not present

## 2017-05-09 DIAGNOSIS — N182 Chronic kidney disease, stage 2 (mild): Secondary | ICD-10-CM | POA: Diagnosis not present

## 2017-05-09 DIAGNOSIS — I5033 Acute on chronic diastolic (congestive) heart failure: Secondary | ICD-10-CM | POA: Diagnosis not present

## 2017-05-09 DIAGNOSIS — J90 Pleural effusion, not elsewhere classified: Secondary | ICD-10-CM | POA: Diagnosis not present

## 2017-05-09 DIAGNOSIS — Z7982 Long term (current) use of aspirin: Secondary | ICD-10-CM | POA: Diagnosis not present

## 2017-05-09 DIAGNOSIS — G9340 Encephalopathy, unspecified: Secondary | ICD-10-CM | POA: Diagnosis not present

## 2017-05-09 DIAGNOSIS — I272 Pulmonary hypertension, unspecified: Secondary | ICD-10-CM

## 2017-05-09 DIAGNOSIS — G9341 Metabolic encephalopathy: Secondary | ICD-10-CM | POA: Diagnosis not present

## 2017-05-09 DIAGNOSIS — Z951 Presence of aortocoronary bypass graft: Secondary | ICD-10-CM | POA: Diagnosis not present

## 2017-05-09 DIAGNOSIS — R2681 Unsteadiness on feet: Secondary | ICD-10-CM | POA: Diagnosis not present

## 2017-05-09 DIAGNOSIS — F0281 Dementia in other diseases classified elsewhere with behavioral disturbance: Secondary | ICD-10-CM | POA: Diagnosis not present

## 2017-05-09 DIAGNOSIS — R748 Abnormal levels of other serum enzymes: Secondary | ICD-10-CM | POA: Diagnosis not present

## 2017-05-09 DIAGNOSIS — L8945 Pressure ulcer of contiguous site of back, buttock and hip, unstageable: Secondary | ICD-10-CM | POA: Diagnosis not present

## 2017-05-09 DIAGNOSIS — I2510 Atherosclerotic heart disease of native coronary artery without angina pectoris: Secondary | ICD-10-CM | POA: Diagnosis not present

## 2017-05-09 DIAGNOSIS — F0390 Unspecified dementia without behavioral disturbance: Secondary | ICD-10-CM | POA: Diagnosis not present

## 2017-05-09 LAB — GLUCOSE, CAPILLARY
Glucose-Capillary: 109 mg/dL — ABNORMAL HIGH (ref 65–99)
Glucose-Capillary: 106 mg/dL — ABNORMAL HIGH (ref 65–99)

## 2017-05-09 MED ORDER — CEFUROXIME AXETIL 250 MG PO TABS: 250.0000 mg | ORAL_TABLET | Freq: Two times a day (BID) | ORAL | 0 refills | Status: AC

## 2017-05-09 MED ORDER — IBUPROFEN 400 MG PO TABS: 400.0000 mg | ORAL_TABLET | Freq: Four times a day (QID) | ORAL | 0 refills | Status: AC | PRN

## 2017-05-09 MED ORDER — COLLAGENASE 250 UNIT/GM EX OINT: TOPICAL_OINTMENT | Freq: Every day | CUTANEOUS | 0 refills | Status: AC

## 2017-05-09 MED ORDER — ENSURE ENLIVE PO LIQD: 237.0000 mL | Freq: Three times a day (TID) | ORAL | Status: DC

## 2017-05-09 MED ORDER — ADULT MULTIVITAMIN W/MINERALS CH: 1.0000 | ORAL_TABLET | Freq: Every day | ORAL | Status: AC

## 2017-05-09 NOTE — Evaluation (Signed)
Occupational Therapy Evaluation Patient Details Name: [name] MRN: [id number] DOB: 09/25/1932 Today's Date: 05/09/2017    History of Present Illness 84 y.o. female readmitted from [facility] the same day as discharge with AMS due to UTI.  Admitted 04/22/17 after a fall with R femur fx.  Pt s/p IM Nail and is WBAT post op.  Pt with complications of pleural effusion s/p thorcentesis 04/26/17, intubation and A-fib.PMH: dementia, CAD with CABG, PVD, CHF, HTN.   Clinical Impression   Prior to pt's fall, she was independent in mobility, ADL and light IADL. She presents with generalized weakness, decreased activity tolerance, impaired standing balance and memory deficits. She appeared to have increased WOB with mobility, but unable to get an accurate reading of Sp02 with dynamap, RN aware. Pt left on 3L 02 in chair. Pt requires +2 for mobility and min to total assist for ADL She will need post acute rehab in SNF. Will follow.    Follow Up Recommendations  SNF;Supervision/Assistance - 24 hour    Equipment Recommendations       Recommendations for Other Services       Precautions / Restrictions Precautions Precautions: Fall Restrictions RLE Weight Bearing: Weight bearing as tolerated      Mobility Bed Mobility Overal bed mobility: Needs Assistance Bed Mobility: Rolling;Sidelying to Sit Rolling: Max assist Sidelying to sit: Max assist       General bed mobility comments: use of pad to roll to L, assist for LEs over EOB and to raise trunk, pt able to advance hips to EOB with min assist  Transfers Overall transfer level: Needs assistance Equipment used: Rolling walker (2 wheeled) Transfers: Sit to/from UGI CorporationStand;Stand Pivot Transfers Sit to Stand: +2 physical assistance;Max assist Stand pivot transfers: +2 physical assistance;Max assist       General transfer comment: cues for hand placement, assist to rise, steady and manage walker as pt took pivotal step to chair, sits  prematurely    Balance Overall balance assessment: Needs assistance   Sitting balance-Leahy Scale: Fair Sitting balance - Comments: statically   Standing balance support: Bilateral upper extremity supported Standing balance-Leahy Scale: Poor Standing balance comment: requires B UE support and external assist                           ADL either performed or assessed with clinical judgement   ADL Overall ADL's : Needs assistance/impaired Eating/Feeding: Set up;Sitting   Grooming: Minimal assistance;Sitting   Upper Body Bathing: Moderate assistance;Sitting   Lower Body Bathing: Sit to/from stand;Total assistance   Upper Body Dressing : Sitting;Moderate assistance   Lower Body Dressing: Sit to/from stand;Total assistance   Toilet Transfer: +2 for physical assistance;Maximal assistance;Stand-pivot;RW Toilet Transfer Details (indicate cue type and reason): simulated to recliner Toileting- Clothing Manipulation and Hygiene: Total assistance;Sit to/from stand         General ADL Comments: Pt appearing to have increased WOB, unable to get accurate reading with dynamap of Sp02, RN notified. HR 105     Vision Baseline Vision/History: Wears glasses Wears Glasses: At all times Patient Visual Report: No change from baseline       Perception     Praxis      Pertinent Vitals/Pain Pain Assessment: Faces Faces Pain Scale: Hurts little more Pain Location: buttocks, R hip Pain Descriptors / Indicators: Discomfort Pain Intervention(s): Monitored during session;Repositioned     Hand Dominance Right   Extremity/Trunk Assessment Upper Extremity Assessment Upper Extremity Assessment:  Generalized weakness(tremulous)   Lower Extremity Assessment Lower Extremity Assessment: Defer to PT evaluation       Communication Communication Communication: No difficulties   Cognition Arousal/Alertness: Awake/alert Behavior During Therapy: WFL for tasks  assessed/performed Overall Cognitive Status: No family/caregiver present to determine baseline cognitive functioning                                 General Comments: pt with hx of dementia   General Comments       Exercises     Shoulder Instructions      Home Living Family/patient expects to be discharged to:: Skilled nursing facility                                        Prior Functioning/Environment Level of Independence: Independent        Comments: ADLs and light ADLs including cooking and cleaning.          OT Problem List: Decreased range of motion;Decreased activity tolerance;Decreased strength;Impaired balance (sitting and/or standing);Decreased cognition;Decreased safety awareness;Decreased knowledge of use of DME or AE;Decreased knowledge of precautions;Pain;Increased edema      OT Treatment/Interventions: Self-care/ADL training;DME and/or AE instruction;Therapeutic activities;Patient/family education    OT Goals(Current goals can be found in the care plan section) Acute Rehab OT Goals Patient Stated Goal: to get stronger OT Goal Formulation: With patient Time For Goal Achievement: 05/25/17 Potential to Achieve Goals: Good ADL Goals Pt Will Perform Upper Body Bathing: with supervision;sitting Pt Will Perform Upper Body Dressing: with supervision;sitting Pt Will Transfer to Toilet: ambulating;bedside commode;with mod assist Pt Will Perform Toileting - Clothing Manipulation and hygiene: with mod assist;sit to/from stand Additional ADL Goal #1: pt will perform bed mobility with min assist in preparation for ADL.  OT Frequency: Min 2X/week   Barriers to D/C:            Co-evaluation PT/OT/SLP Co-Evaluation/Treatment: Yes Reason for Co-Treatment: For patient/therapist safety   OT goals addressed during session: ADL's and self-care      AM-PAC PT "6 Clicks" Daily Activity     Outcome Measure Help from another person  eating meals?: A Little Help from another person taking care of personal grooming?: A Little Help from another person toileting, which includes using toliet, bedpan, or urinal?: Total Help from another person bathing (including washing, rinsing, drying)?: A Lot Help from another person to put on and taking off regular upper body clothing?: A Lot Help from another person to put on and taking off regular lower body clothing?: Total 6 Click Score: 12   End of Session Equipment Utilized During Treatment: Gait belt;Rolling walker Nurse Communication: Mobility status;Other (comment)(aware could not get 02 reading, left on 3L)  Activity Tolerance: Patient tolerated treatment well Patient left: in chair;with call bell/phone within reach;with chair alarm set  OT Visit Diagnosis: Unsteadiness on feet (R26.81);Other abnormalities of gait and mobility (R26.89);Muscle weakness (generalized) (M62.81);Other symptoms and signs involving cognitive function;Pain                Time: 1610-9604 OT Time Calculation (min): 33 min Charges:  OT General Charges $OT Visit: 1 Visit OT Evaluation $OT Eval Moderate Complexity: 1 Mod G-Codes:     2017/05/22 Martie Round, OTR/L Pager: [phone]  Iran Planas, [place] Bailiff May 22, 2017, 10:42 AM

## 2017-05-09 NOTE — Progress Notes (Signed)
Patient will discharge to [facility] Anticipated discharge date: 4/25 Family notified: at bedside Transportation by PTAR- scheduled for 2:15pm  CSW signing off.  Burna SisJenna H. Mylah Baynes, LCSW Clinical Social Worker [phone]

## 2017-05-09 NOTE — Progress Notes (Signed)
Pt discharged to SNF.  Report called in successfully.  IV was discontinued, no redness, pain, or swelling noted at this time. Telemetry discontinued and Centralized Telemetry was notified. Pt left the floor via stretcher with PTAR in stable condition.

## 2017-05-09 NOTE — Discharge Summary (Signed)
Physician Discharge Summary  [name] [id number] DOB: 03/09/1932 DOA: 05/07/2017  PCP: Patient, No Pcp Per  Admit date: 05/07/2017 Discharge date: 05/09/2017  Admitted From: SNF Discharge disposition: SNF   Recommendations for Outpatient Follow-Up:   1. Recommend F/U on abnormal TSH in 6 weeks with repeat testing.   Discharge Diagnosis:   Principal Problem:   Acute metabolic encephalopathy Active Problems:   H/O mitral valve repair   Dementia   Pulmonary hypertension (HCC)   Malnutrition of moderate degree   Pressure injury of skin   Pleural effusion, bilateral   Acute lower UTI   Elevated TSH   Elevated troponin likely secondary to demand ischemia  Discharge Condition: Improved.  Diet recommendation: Low sodium, heart healthy.    Wound care: Wound type: Unstageable pressure injury to sacrum/left buttock Pressure Injury POA: Yes Measurement: 1.5X1.5cm Wound bed: 100% yellow slough Drainage (amount, consistency, odor) no odor, small amt tan drainage Periwound: intact skin surrounding Dressing procedure/placement/frequency: Begin Santyl ointment for enzymatic debridement of nonviable tissue and foam dressing to protect from further injury.   Code status: Full.   History of Present Illness:   [name] is an 84 y.o. female with PMH of pulmonary hypertension, dementia, mitral valve repair, acute on chronic diastolic CHF, carotid artery stenosis, AAA with recent hospitalization for right intertrochanteric femur fracture status post ORIF complicated by postoperative respiratory failure requiring intubation.  Thoracentesis was done during that hospital stay for treatment of pleural effusion.  She was discharged to a SNF 05/07/2017 and readmitted on the same day to evaluate shortness of breath and confusion.  Initial work-up in the ED showed findings concerning for UTI.  Hospital Course by Problem:   Principal Problem:   Acute metabolic encephalopathy in the  setting of an acute lower UTI Patient had a Foley catheter during her previous hospitalization due to urinary retention.  She was voiding well at discharge, but upon readmission, was noted to have an abnormal urinalysis concerning for UTI.  She was started on ceftriaxone with bladder scanning every 6 hours to ensure she is not developing recurrent urinary retention. No recurrent retention. Encephalopathy improved. D/C to SNF on 5 additional days of treatment with Ceftin.  Active Problems:   Elevated troponin likely secondary to demand ischemia Suspect this was secondary to demand ischemia.  2D echo done on 04/23/2017 showed an EF of 60-65% with no regional wall motion abnormality. No complaints of chest pain. Would not work this up further unless the patient becomes symptomatic.    Elevated TSH   Appears to be a new problem.  Free T4 WNL. Recommend repeat testing in 6 weeks.    H/O mitral valve repair Stable.    Dementia Presumed diagnosis given encephalopathy.    Pulmonary hypertension (HCC)/abnormal chest x-ray: Bilateral pleural effusions Mildly dyspneic. Chest x-ray personally reviewed and shows cardiomegaly, calcifications in the great vessels, bilateral pleural effusions with underlying emphysematous changes. Underwent thoracentesis during prior hospitalization. Effusions appear to be relatively stable since then. Recommend close monitoring of respiratory status. Resume Lasix at D/C.      Malnutrition of moderate degree Body mass index is 18.66 kg/m.     Pressure injury, unstageable to the left buttocks/stage I pressure injury to buttocks Wound care as recommended above.  Medical Consultants:    None.   Discharge Exam:   Vitals:   05/08/17 2154 05/09/17 0506  BP: (!) 155/68 (!) 146/61  Pulse: (!) 103 96  Resp: (!) 25 (!) 23  Temp: 98.1 F (36.7 C) 98.5 F (36.9 C)  SpO2: 95% 94%   Vitals:   05/08/17 0328 05/08/17 1510 05/08/17 2154 05/09/17 0506  BP: (!)  140/124 (!) 138/55 (!) 155/68 (!) 146/61  Pulse: 81 70 (!) 103 96  Resp: 16 18 (!) 25 (!) 23  Temp: 98.2 F (36.8 C) 98 F (36.7 C) 98.1 F (36.7 C) 98.5 F (36.9 C)  TempSrc: Oral Oral    SpO2: 98% 98% 95% 94%  Weight:      Height:        General exam: Appears calm and comfortable.  Respiratory system: Clear to auscultation. Diminished in the bases. Increased respiratory rate. Cardiovascular system: S1 & S2 heard, RRR. No JVD,  rubs, gallops or clicks. No murmurs. Gastrointestinal system: Abdomen is nondistended, soft and nontender. No organomegaly or masses felt. Normal bowel sounds heard. Central nervous system: Alert and oriented x 2. No focal neurological deficits. Extremities: No clubbing,  or cyanosis. No edema. Skin: No rashes, lesions or ulcers. Psychiatry: Judgement and insight appear normal. Mood & affect appropriate.    The results of significant diagnostics from this hospitalization (including imaging, microbiology, ancillary and laboratory) are listed below for reference.     Procedures and Diagnostic Studies:   Dg Chest 2 View  Result Date: 05/07/2017 CLINICAL DATA:  Shortness of breath, history chronic diastolic heart failure, hypertension, CABG, former smoker EXAM: CHEST - 2 VIEW COMPARISON:  05/03/2017 FINDINGS: Enlargement of cardiac silhouette post CABG and MVR. Extensive atherosclerotic calcifications aorta, carotid and subclavian arteries. Mediastinal contours and pulmonary vascularity normal. Bibasilar effusions and atelectasis greater on RIGHT. Underlying emphysematous changes. No pneumothorax. Bones demineralized. IMPRESSION: COPD changes with bibasilar pleural effusions and atelectasis. Electronically Signed   By: Ulyses Southward M.D.   On: 05/07/2017 16:51     Labs:   Basic Metabolic Panel: Recent Labs  Lab 05/04/17 0726 05/04/17 1042 05/05/17 0551 05/07/17 1729 05/08/17 0147  NA 142 142 142 142 140  K 4.4 3.6 3.6 4.2 3.9  CL 92* 90* 92* 95* 94*    CO2 37* 38* 38* 34* 36*  GLUCOSE 54* 106* 105* 75 95  BUN 20 19 17 17 16   CREATININE 0.65 0.68 0.56 0.59 0.60  CALCIUM 8.7* 8.7* 8.7* 8.7* 8.4*   GFR Estimated Creatinine Clearance: 39.6 mL/min (by C-G formula based on SCr of 0.6 mg/dL). Liver Function Tests: Recent Labs  Lab 05/07/17 1729  AST 26  ALT 14  ALKPHOS 66  BILITOT 1.4*  PROT 5.2*  ALBUMIN 2.4*   CBC: Recent Labs  Lab 05/03/17 0712 05/03/17 1750 05/07/17 1729 05/08/17 0147  WBC 6.3 6.9 6.8 6.6  NEUTROABS  --   --  5.7  --   HGB 10.2* 9.7* 9.7* 9.2*  HCT 37.4 36.8 35.6* 33.1*  MCV 91.4 92.0 91.5 91.2  PLT 135* 220 196 199   Cardiac Enzymes: Recent Labs  Lab 05/07/17 1729 05/08/17 0147 05/08/17 0646 05/08/17 1220  TROPONINI 0.04* 0.05* 0.04* 0.04*   CBG: Recent Labs  Lab 05/04/17 1024 05/08/17 0815 05/08/17 1247 05/08/17 2238 05/09/17 0743  GLUCAP 46* 78 137* 129* 109*   Thyroid function studies Recent Labs    05/08/17 0147  TSH 7.786*   Microbiology Recent Results (from the past 240 hour(s))  Urine culture     Status: Abnormal (Preliminary result)   Collection Time: 05/07/17  8:57 PM  Result Value Ref Range Status   Specimen Description URINE, CLEAN CATCH  Final   Special Requests  Final    NONE Performed at [facility], [address], [place], [place] [address]    Culture >=100,000 COLONIES/mL KLEBSIELLA OXYTOCA (A)  Final   Report Status PENDING  Incomplete     Discharge Instructions:   Discharge Instructions    Call MD for:  extreme fatigue   Complete by:  As directed    Call MD for:  persistant nausea and vomiting   Complete by:  As directed    Call MD for:  temperature >100.4   Complete by:  As directed    Diet - low sodium heart healthy   Complete by:  As directed    Increase activity slowly   Complete by:  As directed      Allergies as of 05/09/2017   No Known Allergies     Medication List    STOP taking these medications    HYDROcodone-acetaminophen 5-325 MG tablet Commonly known as:  NORCO   LORazepam 1 MG tablet Commonly known as:  ATIVAN     TAKE these medications   aspirin 81 MG tablet Take 81 mg by mouth daily.   atorvastatin 40 MG tablet Commonly known as:  LIPITOR Take 1 tablet (40 mg total) by mouth daily at 6 PM. NEED OV.   cefUROXime 250 MG tablet Commonly known as:  CEFTIN Take 1 tablet (250 mg total) by mouth 2 (two) times daily for 5 days.   collagenase ointment Commonly known as:  SANTYL Apply topically daily. Start taking on:  05/10/2017   enalapril 20 MG tablet Commonly known as:  VASOTEC Take 1 tablet (20 mg total) by mouth daily.   enoxaparin 30 MG/0.3ML injection Commonly known as:  LOVENOX Inject 0.3 mLs (30 mg total) into the skin daily. For 30 day post op for DVT prophylaxis.   feeding supplement (ENSURE ENLIVE) Liqd Take 237 mLs by mouth 2 (two) times daily between meals.   furosemide 40 MG tablet Commonly known as:  LASIX TAKE 1 TABLET BY MOUTH EVERY DAY What changed:    how much to take  how to take this  when to take this   ibuprofen 400 MG tablet Commonly known as:  ADVIL,MOTRIN Take 1 tablet (400 mg total) by mouth every 6 (six) hours as needed for fever, headache or moderate pain.   KLOR-CON M20 20 MEQ tablet Generic drug:  potassium chloride SA TAKE 1 TABLET EVERY DAY -MUST MAKE APPOINTMENT FOR FURTHER REFILLS   metoprolol tartrate 50 MG tablet Commonly known as:  LOPRESSOR Take 1 tablet (50 mg total) by mouth 2 (two) times daily. What changed:  when to take this   multivitamin with minerals Tabs tablet Take 1 tablet by mouth daily. Start taking on:  05/10/2017   polyethylene glycol packet Commonly known as:  MIRALAX / GLYCOLAX Take 17 g by mouth daily as needed for mild constipation.         Time coordinating discharge: 35 minutes.  SignedTrula Ore Juvon Teater  Pager [phone] [facility] 05/09/2017, 12:45  PM

## 2017-05-09 NOTE — Evaluation (Signed)
Physical Therapy Evaluation Patient Details Name: [name] MRN: [id number] DOB: 1932/01/30 Today's Date: 05/09/2017   History of Present Illness  84 y.o. female readmitted from [facility] the same day as discharge with AMS due to UTI.  Admitted 04/22/17 after a fall with R femur fx.  Pt s/p IM Nail and is WBAT post op.  Pt with complications of pleural effusion s/p thorcentesis 04/26/17, intubation and A-fib.PMH: dementia, CAD with CABG, PVD, CHF, HTN.  Clinical Impression   Patient received in bed, very pleasant and apparently A&Ox3 this morning, willing to participate in PT/OT evaluation. She appears to show mild improvement in mobility since last admission, however continues to require MaxA of 1-2 for functional bed mobility, able to scoot her hips forward to EOB with MinA however. She requires MaxA x2 and RW for sit to stand and stand-pivot transfer with RW. Unable to obtain accurate SpO2/HR readings per dynamap once in chair, patient asymptomatic and manual palpation of purse WNL, RN notified regarding patient status and difficulty obtaining readings/measurements. She was left up in the chair with all needs met this morning, and will continue to benefit from skilled PT services in the acute setting as well as ST-SNF to further address functional deficits moving forward.     Follow Up Recommendations SNF    Equipment Recommendations  Other (comment)(defer to next venue )    Recommendations for Other Services       Precautions / Restrictions Precautions Precautions: Fall Restrictions Weight Bearing Restrictions: Yes RLE Weight Bearing: Weight bearing as tolerated      Mobility  Bed Mobility Overal bed mobility: Needs Assistance Bed Mobility: Supine to Sit Rolling: Max assist Sidelying to sit: Max assist Supine to sit: Max assist;+2 for physical assistance     General bed mobility comments: use of pad to roll to L, assist for LEs over EOB and to raise trunk, pt able to advance  hips to EOB with min assist once seated however   Transfers Overall transfer level: Needs assistance Equipment used: Rolling walker (2 wheeled) Transfers: Sit to/from [employer] Transfers Sit to Stand: +2 physical assistance;Max assist Stand pivot transfers: +2 physical assistance;Max assist       General transfer comment: cues for hand placement, assist to rise, steady and manage walker as pt took pivotal step to chair, sits prematurely  Ambulation/Gait             General Gait Details: unable   Stairs            Wheelchair Mobility    Modified Rankin (Stroke Patients Only)       Balance Overall balance assessment: Needs assistance Sitting-balance support: Bilateral upper extremity supported;Feet supported Sitting balance-Leahy Scale: Fair Sitting balance - Comments: statically   Standing balance support: Bilateral upper extremity supported Standing balance-Leahy Scale: Poor Standing balance comment: requires B UE support and external assist                             Pertinent Vitals/Pain Pain Assessment: Faces Faces Pain Scale: Hurts little more Pain Location: buttocks, R hip Pain Descriptors / Indicators: Discomfort Pain Intervention(s): Limited activity within patient's tolerance;Repositioned    Home Living Family/patient expects to be discharged to:: Skilled nursing facility Living Arrangements: Spouse/significant other Available Help at Discharge: Family;Available 24 hours/day Type of Home: House Home Access: Level entry     Home Layout: One level Home Equipment: Walker - 2 wheels  Prior Function Level of Independence: Independent         Comments: ADLs and light ADLs including cooking and cleaning.       Hand Dominance   Dominant Hand: Right    Extremity/Trunk Assessment   Upper Extremity Assessment Upper Extremity Assessment: Defer to OT evaluation    Lower Extremity Assessment Lower Extremity  Assessment: Generalized weakness RLE Deficits / Details: ongoing guarding and weakness noted with functional activities and weight bearing LLE Deficits / Details: appears to have improved since last evaluation at last admission, however generally weak likely secondary to gross deconditinoing     Cervical / Trunk Assessment Cervical / Trunk Assessment: Kyphotic  Communication   Communication: No difficulties  Cognition Arousal/Alertness: Awake/alert Behavior During Therapy: WFL for tasks assessed/performed Overall Cognitive Status: No family/caregiver present to determine baseline cognitive functioning                                 General Comments: pt with hx of dementia      General Comments General comments (skin integrity, edema, etc.): once in chair unable to obtain accurate HR and SpO2 per finger monitor on dynamap, RN notified regarding patient status and difficulty obtaining sats/HR measures     Exercises     Assessment/Plan    PT Assessment Patient needs continued PT services  PT Problem List Decreased strength;Decreased range of motion;Decreased activity tolerance;Decreased balance;Decreased mobility;Decreased knowledge of use of DME;Pain;Cardiopulmonary status limiting activity       PT Treatment Interventions DME instruction;Gait training;Functional mobility training;Therapeutic activities;Therapeutic exercise;Balance training;Patient/family education;Manual techniques;Modalities;Neuromuscular re-education    PT Goals (Current goals can be found in the Care Plan section)  Acute Rehab PT Goals Patient Stated Goal: to get stronger PT Goal Formulation: With patient Time For Goal Achievement: 05/11/17 Potential to Achieve Goals: Fair    Frequency Min 2X/week   Barriers to discharge Decreased caregiver support      Co-evaluation PT/OT/SLP Co-Evaluation/Treatment: Yes Reason for Co-Treatment: For patient/therapist safety;To address functional/ADL  transfers;Complexity of the patient's impairments (multi-system involvement) PT goals addressed during session: Mobility/safety with mobility;Balance;Proper use of DME OT goals addressed during session: ADL's and self-care       AM-PAC PT "6 Clicks" Daily Activity  Outcome Measure Difficulty turning over in bed (including adjusting bedclothes, sheets and blankets)?: Unable Difficulty moving from lying on back to sitting on the side of the bed? : Unable Difficulty sitting down on and standing up from a chair with arms (e.g., wheelchair, bedside commode, etc,.)?: Unable Help needed moving to and from a bed to chair (including a wheelchair)?: A Lot Help needed walking in hospital room?: A Lot Help needed climbing 3-5 steps with a railing? : Total 6 Click Score: 8    End of Session Equipment Utilized During Treatment: Oxygen;Gait belt Activity Tolerance: Patient tolerated treatment well;Patient limited by fatigue Patient left: in chair;with call bell/phone within reach Nurse Communication: Mobility status PT Visit Diagnosis: Muscle weakness (generalized) (M62.81);Difficulty in walking, not elsewhere classified (R26.2);Pain;Unsteadiness on feet (R26.81) Pain - Right/Left: Right Pain - part of body: Leg    Time: 4098-1191 PT Time Calculation (min) (ACUTE ONLY): 33 min   Charges:   PT Evaluation $PT Eval Moderate Complexity: 1 Mod     PT G Codes:        Deniece Ree PT, DPT, CBIS  Supplemental Physical Therapist [place]   Pager [phone]

## 2017-05-09 NOTE — Clinical Social Work Placement (Signed)
   CLINICAL SOCIAL WORK PLACEMENT  NOTE  Date:  05/09/2017  Patient Details  Name: [name] MRN: [id number] Date of Birth: 08/24/1932  Clinical Social Work is seeking post-discharge placement for this patient at the Skilled  Nursing Facility level of care (*CSW will initial, date and re-position this form in  chart as items are completed):  Yes   Patient/family provided with [facility] Clinical Social Work Department's list of facilities offering this level of care within the geographic area requested by the patient (or if unable, by the patient's family).  Yes   Patient/family informed of their freedom to choose among providers that offer the needed level of care, that participate in Medicare, Medicaid or managed care program needed by the patient, have an available bed and are willing to accept the patient.  Yes   Patient/family informed of [facility]'s ownership interest in [facility] and [facility], as well as of the fact that they are under no obligation to receive care at these facilities.  PASRR submitted to EDS on       PASRR number received on       Existing PASRR number confirmed on 05/08/17     FL2 transmitted to all facilities in geographic area requested by pt/family on 05/08/17     FL2 transmitted to all facilities within larger geographic area on       Patient informed that his/her managed care company has contracts with or will negotiate with certain facilities, including the following:            Patient/family informed of bed offers received.  [facility]     Physician recommends and patient chooses bed at      [facility] 05[facility]09/[facility].  Patient to be transferred to facility by ptar     Patient family notified on 05/09/17 of transfer.  Name of family member notified:  son     PHYSICIAN Please sign FL2     Additional Comment:     _______________________________________________ Burna SisUris, Ulysses Alper H, LCSW 05/09/2017, 1:15 PM

## 2017-05-09 NOTE — Progress Notes (Signed)
Initial Nutrition Assessment  DOCUMENTATION CODES:   Non-severe (moderate) malnutrition in context of chronic illness  INTERVENTION:  Ensure Enlive po TID, each supplement provides 350 kcal and 20 grams of protein  Encourage PO intake  NUTRITION DIAGNOSIS:   Moderate Malnutrition related to chronic illness(dementia) as evidenced by moderate fat depletion, moderate muscle depletion, energy intake < 75% for > or equal to 1 month.  GOAL:   Patient will meet greater than or equal to 90% of their needs  MONITOR:   PO intake, Supplement acceptance, Labs  REASON FOR ASSESSMENT:   Low Braden    ASSESSMENT:   [name] is an 84 y.o. female with PMH of pulmonary hypertension, dementia, mitral valve repair, acute on chronic diastolic CHF, carotid artery stenosis, AAA with recent hospitalization for right intertrochanteric femur fracture status post ORIF complicated by postoperative respiratory failure requiring intubation.  Thoracentesis was done during that hospital stay for treatment of pleural effusion.  She was discharged to a SNF 05/07/2017 and readmitted on the same day to evaluate shortness of breath and confusion, now being treated for UTI and encephalopathy  Spoke with [name] and [name] at bedside. She is well known to RD from previous admission.  She states that she continues to eat small amounts 4 times a day. Does not have much of an appetite but makes herself eat. Had a few bites of "scrambled eggs, mashed potatoes, green beans and bacon." Sounded like she was confusing lunch and breakfast together, or may have been confused in general but either way she didn't eat much.  [name] says he was not here to see what she had.  When this RD brought up Ensure she received on previous admission she said she could just drink that and not get any food on trays.  This RD encouraged her to eat as much as she could, drink the ensure, and feel better, she was receptive to this. It  appears her admission weight was stated, but she states "I think I lost a few pounds." Patient and [name] are unsure of UBW.  Labs reviewed Medications reviewed and include:  MVI w/ Minerals, 20K+  NUTRITION - FOCUSED PHYSICAL EXAM:    Most Recent Value  Orbital Region  Moderate depletion  Upper Arm Region  Severe depletion  Thoracic and Lumbar Region  Moderate depletion  Buccal Region  Moderate depletion  Temple Region  Severe depletion  Clavicle Bone Region  Severe depletion  Clavicle and Acromion Bone Region  Moderate depletion  Scapular Bone Region  Unable to assess  Dorsal Hand  Moderate depletion  Patellar Region  Moderate depletion  Anterior Thigh Region  Moderate depletion  Posterior Calf Region  Moderate depletion  Edema (RD Assessment)  Mild       Diet Order:  DIET SOFT Room service appropriate? Yes; Fluid consistency: Thin  EDUCATION NEEDS:   Education needs have been addressed  Skin:  Skin Integrity Issues:: Stage I, Incisions Stage I: buttocks Incisions: closed to R hip  Last BM:  05/05/2017  Height:   Ht Readings from Last 1 Encounters:  05/07/17 5\' 2"  (1.575 m)    Weight:   Wt Readings from Last 1 Encounters:  05/07/17 102 lb (46.3 kg)    Ideal Body Weight:  50 kg  BMI:  Body mass index is 18.66 kg/m.  Estimated Nutritional Needs:   Kcal:  1250-1500 calories  Protein:  64-74 grams (1.4-1.6g/kg)  Fluid:  >1.5L  Dionne Ano. Yaniv Lage, MS, RD LDN Inpatient  Clinical Dietitian Pager [phone]

## 2017-05-10 LAB — URINE CULTURE

## 2017-05-13 ENCOUNTER — Encounter (HOSPITAL_COMMUNITY): Payer: Self-pay | Admitting: Emergency Medicine

## 2017-05-13 ENCOUNTER — Emergency Department (HOSPITAL_COMMUNITY)
Admission: EM | Admit: 2017-05-13 | Discharge: 2017-05-15 | Disposition: E | Payer: Medicare Other | Attending: Emergency Medicine | Admitting: Emergency Medicine

## 2017-05-13 DIAGNOSIS — N182 Chronic kidney disease, stage 2 (mild): Secondary | ICD-10-CM | POA: Diagnosis not present

## 2017-05-13 DIAGNOSIS — I469 Cardiac arrest, cause unspecified: Secondary | ICD-10-CM | POA: Diagnosis not present

## 2017-05-13 DIAGNOSIS — F0390 Unspecified dementia without behavioral disturbance: Secondary | ICD-10-CM | POA: Insufficient documentation

## 2017-05-13 DIAGNOSIS — Z79899 Other long term (current) drug therapy: Secondary | ICD-10-CM | POA: Diagnosis not present

## 2017-05-13 DIAGNOSIS — I2510 Atherosclerotic heart disease of native coronary artery without angina pectoris: Secondary | ICD-10-CM | POA: Diagnosis not present

## 2017-05-13 DIAGNOSIS — Z7982 Long term (current) use of aspirin: Secondary | ICD-10-CM | POA: Diagnosis not present

## 2017-05-13 DIAGNOSIS — Z951 Presence of aortocoronary bypass graft: Secondary | ICD-10-CM | POA: Insufficient documentation

## 2017-05-13 DIAGNOSIS — Z87891 Personal history of nicotine dependence: Secondary | ICD-10-CM | POA: Insufficient documentation

## 2017-05-15 NOTE — ED Notes (Signed)
Patient placement notified pt is on the way to the morgue.

## 2017-05-15 NOTE — ED Notes (Signed)
PT arrived via [facility], CPR already in progess when they arrived at 0514 to [facility]. #18 EAJ and R Lower extremity I/O, NS bolus given by EMS. Epi x 8 given PTA, PEA on monitor, pt regain pulse briefly at 0540 for 2 min, CPR restarted.  CPR continued on arrival utilizing [place] device.  Pulse check at 0629, no pulse. TOD [phone]

## 2017-05-15 NOTE — Progress Notes (Signed)
I responded to ED page for pt in cpr who passed.  Staff requested support for husband and other family who were in route.  Arrived in ED before family and remained available to staff.  Once husband Fayrene Fearing) and sister of pt and brother -in-law arrived, informed Dr and accompanied to inform husband of patient passing.  Family decided they did not wish to visit pt remains at this time.   Family was sad but also at peace due to pt sickness over past period of time.     Husband and family indicated that pt would be taken to funeral home in [place], [place] - [facility].  Advised family to make contact with funeral home and they would provide assistance on how pt remains would be transported.  Gave family main hospital # [phone] and advised to talk to ED once arrangements were made.  Guided family and husband out [employer] entrance once they were ready to leave hospital.  Family expressed appreciation for my support with a kind hug and handshake and their kind words. Gave update to staff on funeral home and next of kin info.

## 2017-05-15 NOTE — ED Provider Notes (Signed)
[facility] EMERGENCY DEPARTMENT Provider Note   CSN: [id number] Arrival date & time: 05-28-2017  5284     History   Chief Complaint Chief Complaint  Patient presents with  . CPR    HPI [name] is a 84 y.o. female.  Patient is an 84 year old female with extensive past medical history including congestive heart failure, coronary artery disease, abdominal aortic aneurysm, and dementia.  She was recently admitted for a femur fracture which was treated surgically.  She had a complicated postoperative course including respiratory failure requiring intubation.  She was ultimately discharged to an extended care facility.  She was last seen normal this morning at approximately 430, then was found by nursing home staff to be unresponsive.  CPR was initiated and 911 was called.  Paramedics found the patient in PEA and CPR was continued.  The patient was intubated in the field, then transported here with CPR remaining in progress.  She had received multiple rounds of cardiac medications.  The history is provided by the patient.    Past Medical History:  Diagnosis Date  . AAA (abdominal aortic aneurysm) (HCC)   . Carotid artery stenosis   . Chronic diastolic heart failure (HCC)   . Coronary atherosclerosis of native coronary artery   . Dementia   . Hypercholesteremia   . Hypertension, essential, benign   . PVD (peripheral vascular disease) (HCC)   . Rhinitis, allergic    NOS    Patient Active Problem List   Diagnosis Date Noted  . Acute lower UTI 05/08/2017  . Elevated TSH 05/08/2017  . Elevated troponin likely secondary to demand ischemia 05/08/2017  . Acute metabolic encephalopathy 05/07/2017  . Hypoxemia   . Pleural effusion, bilateral   . Acute encephalopathy   . Malnutrition of moderate degree 04/27/2017  . Pressure injury of skin 04/27/2017  . Endotracheal tube present   . Preoperative respiratory examination   . Evaluation by psychiatric service  required   . Hypoxia   . Closed displaced intertrochanteric fracture of right femur (HCC) 04/22/2017  . Hypertension, essential, benign 04/22/2017  . Acute on chronic diastolic (congestive) heart failure (HCC) 04/22/2017  . Dyslipidemia, goal LDL below 70 04/22/2017  . PVD (peripheral vascular disease) (HCC) 04/22/2017  . Dementia 04/22/2017  . AAA (abdominal aortic aneurysm) (HCC) 04/22/2017  . Acute respiratory failure with hypoxia (HCC) 04/22/2017  . CKD (chronic kidney disease), stage III (HCC) 04/22/2017  . Carotid artery stenosis 04/22/2017  . Pulmonary hypertension (HCC) 04/22/2017  . Former tobacco use 04/22/2017  . Hx of CABG 04/22/2017  . H/O mitral valve repair 06/04/2012  . CELLULITIS, HAND 08/22/2010  . RENAL ARTERY STENOSIS 09/07/2009  . HYPOKALEMIA 07/26/2009  . RHINITIS, ALLERGIC NOS 12/04/2005    Past Surgical History:  Procedure Laterality Date  . CAROTID ENDARTERECTOMY  03/2008   Right  . COLONOSCOPY  1996   in [place]  . CORONARY ARTERY BYPASS GRAFT  05/22/2006   Left int mammary to distal left ant descending CA, spahenous vein graft to ramus intermediate branch  . INTRAMEDULLARY (IM) NAIL INTERTROCHANTERIC Right 04/25/2017   Procedure: INTRAMEDULLARY (IM) NAIL INTERTROCHANTRIC;  Surgeon: Sheral Apley, MD;  Location: [facility] OR;  Service: Orthopedics;  Laterality: Right;  . IR THORACENTESIS ASP PLEURAL SPACE W/IMG GUIDE  05/03/2017  . MITRAL VALVE REPAIR  05/22/2006   Quadrangular resection of post leaflet w/ sliding leaflet annuloplaty and 24mm Edwards Physio ring     OB History   None  Home Medications    Prior to Admission medications   Medication Sig Start Date End Date Taking? Authorizing Provider  aspirin 81 MG tablet Take 81 mg by mouth daily.      [provider]  atorvastatin (LIPITOR) 40 MG tablet Take 1 tablet (40 mg total) by mouth daily at 6 PM. NEED OV. 01/07/17   Lewayne Bunting, MD  cefUROXime (CEFTIN) 250 MG tablet Take 1  tablet (250 mg total) by mouth 2 (two) times daily for 5 days. 05/09/17 05/14/17  Rama, Maryruth Bun, MD  collagenase (SANTYL) ointment Apply topically daily. 05/10/17   Rama, Maryruth Bun, MD  enalapril (VASOTEC) 20 MG tablet Take 1 tablet (20 mg total) by mouth daily. 05/07/17   Hongalgi, Maximino Greenland, MD  enoxaparin (LOVENOX) 30 MG/0.3ML injection Inject 0.3 mLs (30 mg total) into the skin daily. For 30 day post op for DVT prophylaxis. 04/25/17   Martensen, Lucretia Kern III, PA-C  feeding supplement, ENSURE ENLIVE, (ENSURE ENLIVE) LIQD Take 237 mLs by mouth 2 (two) times daily between meals. Patient not taking: Reported on 05/07/2017 05/01/17   Rai, Delene Ruffini, MD  furosemide (LASIX) 40 MG tablet TAKE 1 TABLET BY MOUTH EVERY DAY Patient taking differently:  by mouth daily for fluid 03/23/16   Lewayne Bunting, MD  ibuprofen (ADVIL,MOTRIN) 400 MG tablet Take 1 tablet (400 mg total) by mouth every 6 (six) hours as needed for fever, headache or moderate pain. 05/09/17   Rama, Maryruth Bun, MD  KLOR-CON M20 20 MEQ tablet TAKE 1 TABLET EVERY DAY -MUST MAKE APPOINTMENT FOR FURTHER REFILLS 07/10/16   Lewayne Bunting, MD  metoprolol tartrate (LOPRESSOR) 50 MG tablet Take 1 tablet (50 mg total) by mouth 2 (two) times daily. Patient taking differently: Take 50 mg by mouth daily.  05/01/17   Rai, Delene Ruffini, MD  Multiple Vitamin (MULTIVITAMIN WITH MINERALS) TABS tablet Take 1 tablet by mouth daily. 05/10/17   Rama, Maryruth Bun, MD  polyethylene glycol (MIRALAX / GLYCOLAX) packet Take 17 g by mouth daily as needed for mild constipation. 05/01/17   Cathren Harsh, MD    Family History Family History  Problem Relation Age of Onset  . Heart attack Mother     Social History Social History   Tobacco Use  . Smoking status: Former Smoker    Types: Cigarettes    Last attempt to quit: 01/15/2006    Years since quitting: 11.3  . Smokeless tobacco: Never Used  Substance Use Topics  . Alcohol use: Not Currently  . Drug  use: Never     Allergies   Patient has no known allergies.   Review of Systems Review of Systems  Unable to perform ROS: Acuity of condition     Physical Exam Updated Vital Signs There were no vitals taken for this visit.  Physical Exam  Constitutional:  Patient is an elderly, cachectic female.  She is unresponsive with CPR in progress.  HENT:  Head: Normocephalic and atraumatic.  Eyes:  Pupils are fixed and dilated  Neck: Normal range of motion.  Cardiovascular:  No heart sounds are audible.  Pulmonary/Chest:  Patient is intubated and respirations supplied by Ambu bag.  Breath sounds are audible bilaterally but coarse.  Musculoskeletal: She exhibits no edema.  Neurological:  GCS is 3  Skin:  Skin is cool to the touch  Nursing note reviewed.    ED Treatments / Results  Labs (all labs ordered are listed, but only abnormal results are displayed)  Labs Reviewed - No data to display  EKG None  Radiology No results found.  Procedures Procedures (including critical care time)  Medications Ordered in ED Medications - No data to display   Initial Impression / Assessment and Plan / ED Course  I have reviewed the triage vital signs and the nursing notes.  Pertinent labs & imaging results that were available during my care of the patient were reviewed by me and considered in my medical decision making (see chart for details).  Patient arrived here by EMS with CPR in progress.  She had been intubated in the field and compressions were being performed by the [facility] device.  On initial assessment, the endotracheal tube appears to be in adequate position.  I can hear breath sounds bilaterally.  Compressions were continued until the [place] device was removed.  This was removed and a rhythm check was performed.  She had occasional agonal beats with no pulses.  As she arrived here showing no signs of life with CPR in progress for nearly 90 minutes, the decision was made  to end resuscitative efforts and to clear the patient expired.  I personally notified the patient's husband and sister/brother-in-law of her passing.  I also notified the nurse practitioner Raelyn Mora) working alongside the patient's primary doctor (Dr. Leroy Kennedy).  They agreed to sign off on the death certificate.  Final Clinical Impressions(s) / ED Diagnoses   Final diagnoses:  None    ED Discharge Orders    None       Geoffery Lyons, MD Jun 03, 2017 [phone]

## 2017-05-15 NOTE — ED Triage Notes (Signed)
PT arrived via EMS from [place] [facility] at [facility]. Per EMS LSN 0430, EMS arrived @ [phone], CPR being performed by fire department.

## 2017-05-15 DEATH — deceased

## 2017-05-19 ENCOUNTER — Other Ambulatory Visit: Payer: Self-pay | Admitting: Cardiology

## 2017-05-20 ENCOUNTER — Other Ambulatory Visit: Payer: Self-pay | Admitting: Cardiology

## 2018-06-03 IMAGING — DX DG CHEST 1V PORT
1 series · 1 of 1 positions shown · non-contrast
Comparison: 04/26/2017

CLINICAL DATA: Pleural effusion, shortness of breath

EXAM:
PORTABLE CHEST 1 VIEW

[chest ap]
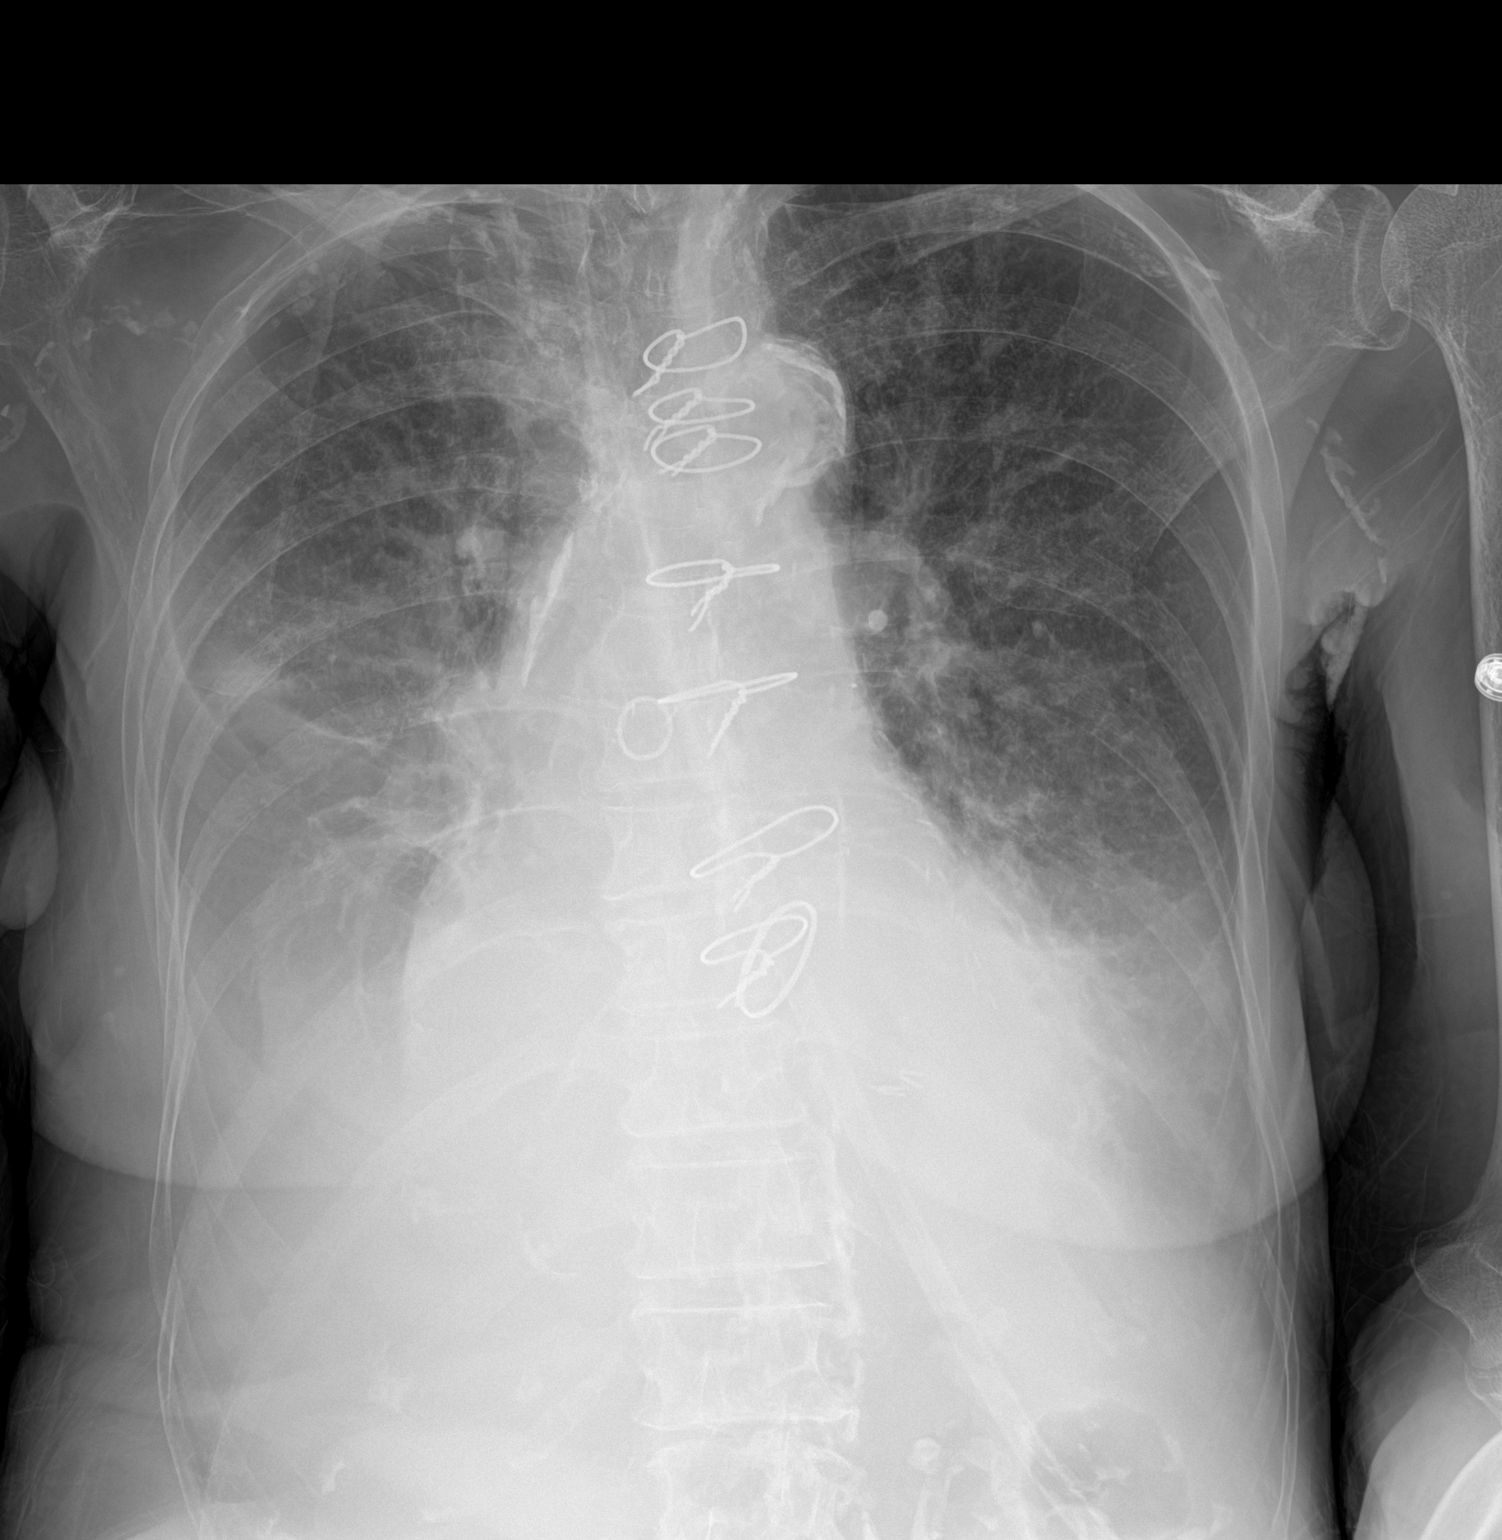

[1 of 1 positions shown; findings below may reference images not displayed]

FINDINGS: Prior CABG. Cardiomegaly. Layering bilateral effusions have
increased since prior study. Increasing perihilar and lower lobe
opacities and interstitial prominence, likely edema and superimposed
areas of atelectasis.
IMPRESSION: Increasing layering bilateral effusions, bilateral perihilar and
lower lobe opacities and interstitial prominence. Findings likely
reflect edema/CHF and superimposed areas of bibasilar atelectasis.

## 2018-06-06 IMAGING — US IR THORACENTESIS ASP PLEURAL SPACE W/IMG GUIDE
1 series · 3 of 3 positions shown · non-contrast
Comparison: none

INDICATION: Patient with shortness of breath, respiratory failure. Now with
recurrent right pleural effusion. Request is made for therapeutic
thoracentesis.

[Series 1: ir thoracentesis asp pleural space w/img guide · 3 of 3 slices shown]
[im 1/3]
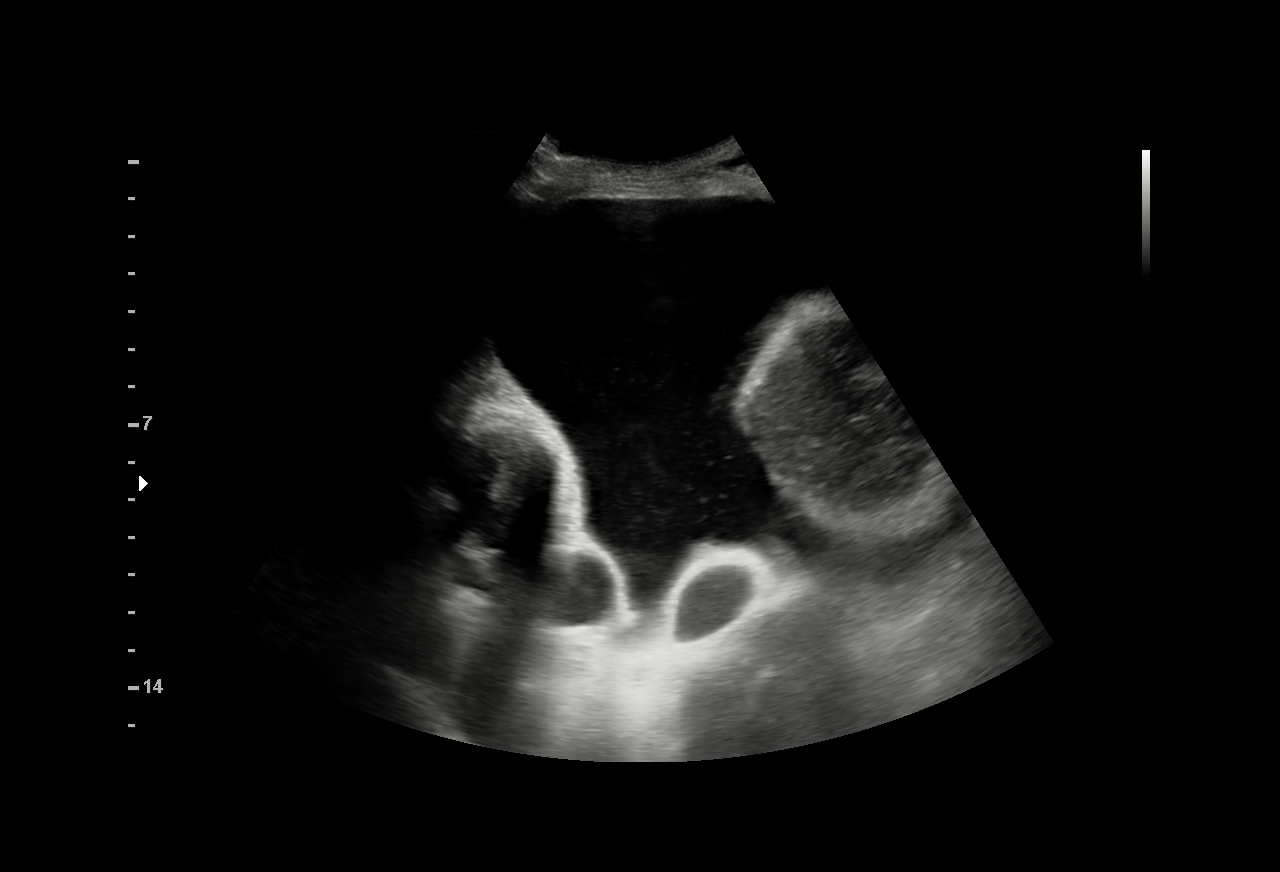
[im 2/3]
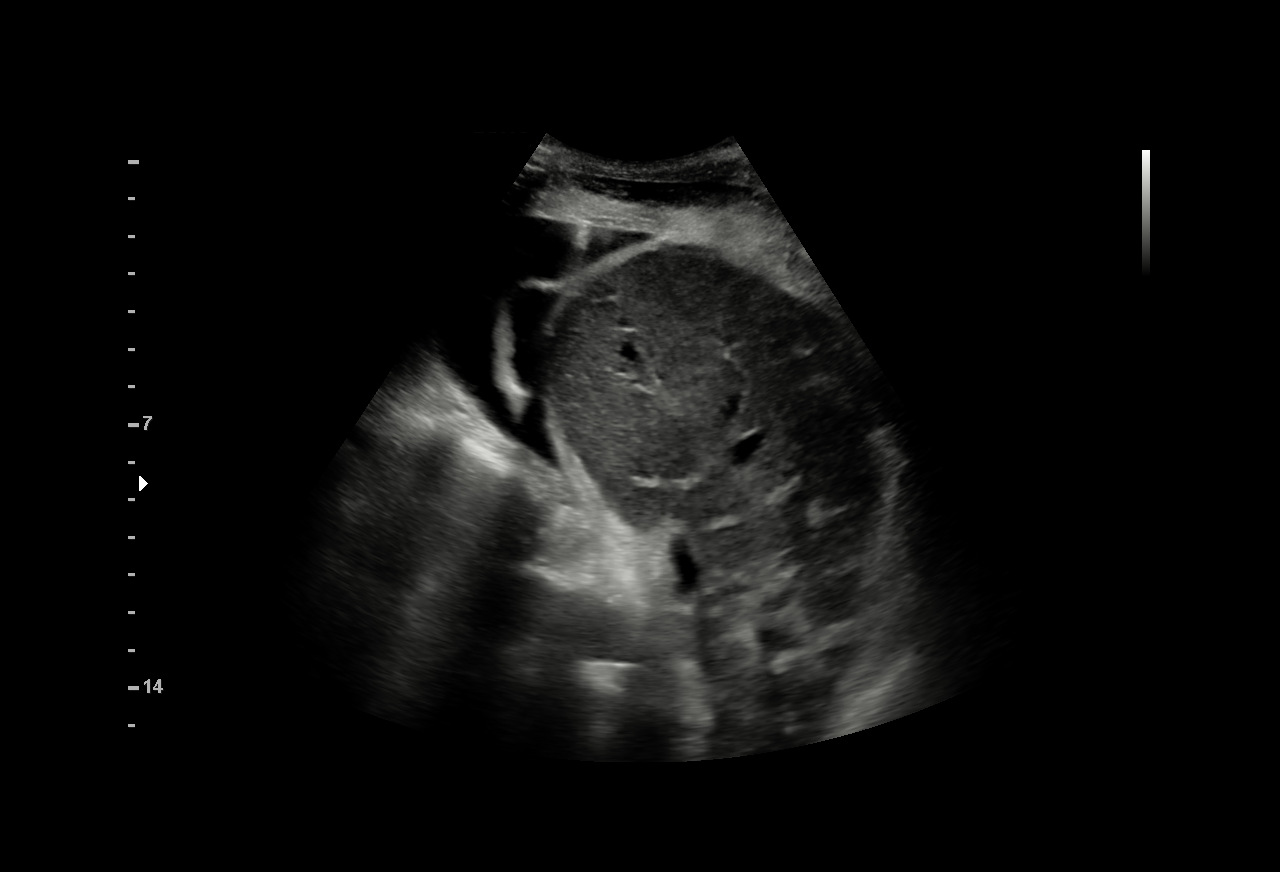
[im 3/3]
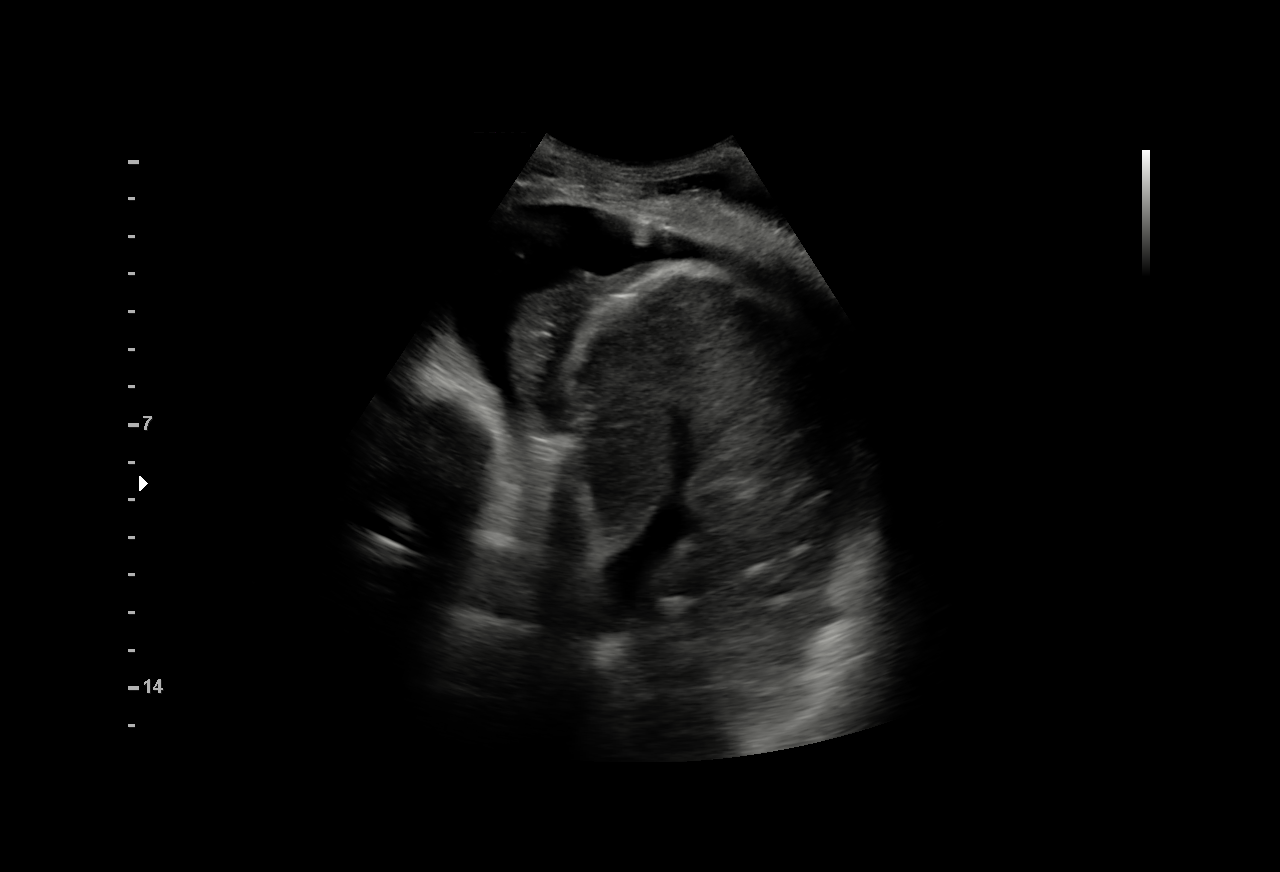

[3 of 3 positions shown; findings below may reference images not displayed]

EXAM:
ULTRASOUND GUIDED THERAPEUTIC RIGHT THORACENTESIS

MEDICATIONS:
10 mL 1% lidocaine

COMPLICATIONS:
None immediate.

PROCEDURE:
An ultrasound guided thoracentesis was thoroughly discussed with the
patient and questions answered. The benefits, risks, alternatives
and complications were also discussed. The patient understands and
wishes to proceed with the procedure. Written consent was obtained.

Ultrasound was performed to localize and mark an adequate pocket of
fluid in the right chest. The area was then prepped and draped in
the normal sterile fashion. 1% Lidocaine was used for local
anesthesia. Under ultrasound guidance a 19 gauge, 7-cm, Yueh
catheter was introduced. Thoracentesis was performed. The catheter
was removed and a dressing applied.
FINDINGS: A total of approximately 1.2 liters of yellow fluid was removed.
Samples were sent to the laboratory as requested by the clinical
team.
IMPRESSION: Successful ultrasound guided therapeutic right thoracentesis
yielding 1.2 liters of pleural fluid.
